# Patient Record
Sex: Female | Born: 1975 | Race: White | Hispanic: No | Marital: Married | Smoking: Never smoker
Health system: Southern US, Community
[De-identification: ages and names within clinical notes are randomized; demographics above are authoritative.]

## PROBLEM LIST (undated history)

## (undated) DIAGNOSIS — Z9889 Other specified postprocedural states: Secondary | ICD-10-CM

## (undated) DIAGNOSIS — F419 Anxiety disorder, unspecified: Secondary | ICD-10-CM

## (undated) DIAGNOSIS — T8859XA Other complications of anesthesia, initial encounter: Secondary | ICD-10-CM

## (undated) DIAGNOSIS — R519 Headache, unspecified: Secondary | ICD-10-CM

## (undated) DIAGNOSIS — R112 Nausea with vomiting, unspecified: Secondary | ICD-10-CM

## (undated) DIAGNOSIS — G4730 Sleep apnea, unspecified: Secondary | ICD-10-CM

## (undated) DIAGNOSIS — T4145XA Adverse effect of unspecified anesthetic, initial encounter: Secondary | ICD-10-CM

## (undated) DIAGNOSIS — K219 Gastro-esophageal reflux disease without esophagitis: Secondary | ICD-10-CM

## (undated) DIAGNOSIS — K631 Perforation of intestine (nontraumatic): Secondary | ICD-10-CM

## (undated) DIAGNOSIS — I10 Essential (primary) hypertension: Secondary | ICD-10-CM

## (undated) DIAGNOSIS — L4050 Arthropathic psoriasis, unspecified: Secondary | ICD-10-CM

## (undated) DIAGNOSIS — D649 Anemia, unspecified: Secondary | ICD-10-CM

## (undated) DIAGNOSIS — K259 Gastric ulcer, unspecified as acute or chronic, without hemorrhage or perforation: Secondary | ICD-10-CM

## (undated) DIAGNOSIS — Z87442 Personal history of urinary calculi: Secondary | ICD-10-CM

## (undated) HISTORY — PX: COLON SURGERY: SHX602

---
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
## 2003-07-12 ENCOUNTER — Ambulatory Visit (HOSPITAL_COMMUNITY): Admission: RE | Admit: 2003-07-12 | Discharge: 2003-07-12 | Payer: Self-pay | Admitting: Podiatry

## 2003-07-18 ENCOUNTER — Ambulatory Visit (HOSPITAL_COMMUNITY): Admission: RE | Admit: 2003-07-18 | Discharge: 2003-07-18 | Payer: Self-pay | Admitting: Podiatry

## 2009-04-17 ENCOUNTER — Emergency Department: Payer: Self-pay | Admitting: Unknown Physician Specialty

## 2009-08-15 ENCOUNTER — Emergency Department: Payer: Self-pay | Admitting: Emergency Medicine

## 2009-08-19 ENCOUNTER — Ambulatory Visit: Payer: Self-pay | Admitting: Gastroenterology

## 2009-08-23 ENCOUNTER — Ambulatory Visit: Payer: Self-pay | Admitting: Gastroenterology

## 2010-08-03 ENCOUNTER — Emergency Department: Payer: Self-pay | Admitting: Emergency Medicine

## 2010-08-18 ENCOUNTER — Ambulatory Visit: Payer: Self-pay | Admitting: Gastroenterology

## 2010-08-20 ENCOUNTER — Encounter: Payer: Self-pay | Admitting: Podiatry

## 2010-08-24 LAB — PATHOLOGY REPORT

## 2010-11-10 ENCOUNTER — Ambulatory Visit: Payer: Self-pay | Admitting: General Practice

## 2011-01-08 ENCOUNTER — Emergency Department: Payer: Self-pay | Admitting: Emergency Medicine

## 2011-01-09 ENCOUNTER — Ambulatory Visit: Payer: Self-pay | Admitting: Urology

## 2013-04-21 ENCOUNTER — Emergency Department: Payer: Self-pay | Admitting: Emergency Medicine

## 2013-04-21 LAB — COMPREHENSIVE METABOLIC PANEL
Albumin: 2.9 g/dL — ABNORMAL LOW (ref 3.4–5.0)
Alkaline Phosphatase: 161 U/L — ABNORMAL HIGH (ref 50–136)
Anion Gap: 11 (ref 7–16)
BUN: 8 mg/dL (ref 7–18)
Bilirubin,Total: 0.4 mg/dL (ref 0.2–1.0)
Calcium, Total: 9 mg/dL (ref 8.5–10.1)
Chloride: 106 mmol/L (ref 98–107)
Co2: 20 mmol/L — ABNORMAL LOW (ref 21–32)
Creatinine: 0.86 mg/dL (ref 0.60–1.30)
EGFR (African American): >60
EGFR (Non-African Amer.): >60
Glucose: 90 mg/dL (ref 65–99)
Osmolality: 272 (ref 275–301)
Potassium: 3.7 mmol/L (ref 3.5–5.1)
SGOT(AST): 16 U/L (ref 15–37)
SGPT (ALT): 22 U/L (ref 12–78)
Sodium: 137 mmol/L (ref 136–145)
Total Protein: 8.3 g/dL — ABNORMAL HIGH (ref 6.4–8.2)

## 2013-04-21 LAB — CBC
HCT: 32 % — ABNORMAL LOW (ref 35.0–47.0)
HGB: 10.9 g/dL — ABNORMAL LOW (ref 12.0–16.0)
MCH: 27.9 pg (ref 26.0–34.0)
MCHC: 34.1 g/dL (ref 32.0–36.0)
MCV: 82 fL (ref 80–100)
Platelet: 517 10*3/uL — ABNORMAL HIGH (ref 150–440)
RBC: 3.91 10*6/uL (ref 3.80–5.20)
RDW: 14.2 % (ref 11.5–14.5)
WBC: 9.5 10*3/uL (ref 3.6–11.0)

## 2013-04-21 LAB — TROPONIN I: Troponin-I: <0.02 ng/mL

## 2013-05-02 ENCOUNTER — Ambulatory Visit: Payer: Self-pay | Admitting: Specialist

## 2013-05-05 ENCOUNTER — Ambulatory Visit: Payer: Self-pay | Admitting: Specialist

## 2013-06-06 ENCOUNTER — Ambulatory Visit: Payer: Self-pay | Admitting: Specialist

## 2019-09-03 ENCOUNTER — Emergency Department: Payer: BC Managed Care – PPO

## 2019-09-03 ENCOUNTER — Encounter: Payer: Self-pay | Admitting: Emergency Medicine

## 2019-09-03 ENCOUNTER — Inpatient Hospital Stay
Admission: EM | Admit: 2019-09-03 | Discharge: 2019-09-12 | DRG: 391 | Disposition: A | Discharge status: Home / Self Care | Payer: BC Managed Care – PPO | Attending: Surgery | Admitting: Surgery

## 2019-09-03 ENCOUNTER — Other Ambulatory Visit: Payer: Self-pay

## 2019-09-03 DIAGNOSIS — Z91048 Other nonmedicinal substance allergy status: Secondary | ICD-10-CM | POA: Diagnosis not present

## 2019-09-03 DIAGNOSIS — Z20822 Contact with and (suspected) exposure to covid-19: Secondary | ICD-10-CM | POA: Diagnosis present

## 2019-09-03 DIAGNOSIS — Z6833 Body mass index (BMI) 33.0-33.9, adult: Secondary | ICD-10-CM | POA: Diagnosis not present

## 2019-09-03 DIAGNOSIS — L4050 Arthropathic psoriasis, unspecified: Secondary | ICD-10-CM | POA: Diagnosis present

## 2019-09-03 DIAGNOSIS — N390 Urinary tract infection, site not specified: Secondary | ICD-10-CM | POA: Diagnosis present

## 2019-09-03 DIAGNOSIS — Z888 Allergy status to other drugs, medicaments and biological substances status: Secondary | ICD-10-CM | POA: Diagnosis not present

## 2019-09-03 DIAGNOSIS — Z886 Allergy status to analgesic agent status: Secondary | ICD-10-CM

## 2019-09-03 DIAGNOSIS — B961 Klebsiella pneumoniae [K. pneumoniae] as the cause of diseases classified elsewhere: Secondary | ICD-10-CM | POA: Diagnosis present

## 2019-09-03 DIAGNOSIS — K5720 Diverticulitis of large intestine with perforation and abscess without bleeding: Secondary | ICD-10-CM | POA: Diagnosis present

## 2019-09-03 DIAGNOSIS — E669 Obesity, unspecified: Secondary | ICD-10-CM | POA: Diagnosis present

## 2019-09-03 DIAGNOSIS — Z8711 Personal history of peptic ulcer disease: Secondary | ICD-10-CM

## 2019-09-03 DIAGNOSIS — K651 Peritoneal abscess: Secondary | ICD-10-CM | POA: Diagnosis present

## 2019-09-03 DIAGNOSIS — K5780 Diverticulitis of intestine, part unspecified, with perforation and abscess without bleeding: Secondary | ICD-10-CM

## 2019-09-03 DIAGNOSIS — R1032 Left lower quadrant pain: Secondary | ICD-10-CM | POA: Diagnosis present

## 2019-09-03 HISTORY — DX: Gastric ulcer, unspecified as acute or chronic, without hemorrhage or perforation: K25.9

## 2019-09-03 HISTORY — DX: Arthropathic psoriasis, unspecified: L40.50

## 2019-09-03 LAB — COMPREHENSIVE METABOLIC PANEL
ALT: 21 U/L (ref 0–44)
AST: 21 U/L (ref 15–41)
Albumin: 3.9 g/dL (ref 3.5–5.0)
Alkaline Phosphatase: 86 U/L (ref 38–126)
Anion gap: 9 (ref 5–15)
BUN: 11 mg/dL (ref 6–20)
CO2: 24 mmol/L (ref 22–32)
Calcium: 8.7 mg/dL — ABNORMAL LOW (ref 8.9–10.3)
Chloride: 105 mmol/L (ref 98–111)
Creatinine, Ser: 1.15 mg/dL — ABNORMAL HIGH (ref 0.44–1.00)
GFR calc Af Amer: >60 mL/min (ref >60)
GFR calc non Af Amer: 57 mL/min — ABNORMAL LOW (ref >60)
Glucose, Bld: 123 mg/dL — ABNORMAL HIGH (ref 70–99)
Potassium: 3.7 mmol/L (ref 3.5–5.1)
Sodium: 138 mmol/L (ref 135–145)
Total Bilirubin: 0.6 mg/dL (ref 0.3–1.2)
Total Protein: 8.1 g/dL (ref 6.5–8.1)

## 2019-09-03 LAB — CBC
HCT: 46.8 % — ABNORMAL HIGH (ref 36.0–46.0)
Hemoglobin: 15.8 g/dL — ABNORMAL HIGH (ref 12.0–15.0)
MCH: 32 pg (ref 26.0–34.0)
MCHC: 33.8 g/dL (ref 30.0–36.0)
MCV: 94.7 fL (ref 80.0–100.0)
Platelets: 372 10*3/uL (ref 150–400)
RBC: 4.94 MIL/uL (ref 3.87–5.11)
RDW: 12.8 % (ref 11.5–15.5)
WBC: 9.2 10*3/uL (ref 4.0–10.5)
nRBC: 0 % (ref 0.0–0.2)

## 2019-09-03 LAB — RESPIRATORY PANEL BY RT PCR (FLU A&B, COVID)
Influenza A by PCR: NEGATIVE
Influenza B by PCR: NEGATIVE
SARS Coronavirus 2 by RT PCR: NEGATIVE

## 2019-09-03 LAB — LACTIC ACID, PLASMA: Lactic Acid, Venous: 1.5 mmol/L (ref 0.5–1.9)

## 2019-09-03 LAB — LIPASE, BLOOD: Lipase: 28 U/L (ref 11–51)

## 2019-09-03 MED ORDER — SODIUM CHLORIDE 0.9 % IV SOLN: Freq: Once | INTRAVENOUS | Status: AC

## 2019-09-03 MED ORDER — FENTANYL CITRATE (PF) 100 MCG/2ML IJ SOLN
50.0000 ug | INTRAMUSCULAR | Status: DC | PRN
  Administered 2019-09-03: 50 ug via INTRAVENOUS
  Filled 2019-09-03: qty 2

## 2019-09-03 MED ORDER — ONDANSETRON 4 MG PO TBDP: 4.0000 mg | ORAL_TABLET | Freq: Four times a day (QID) | ORAL | Status: DC | PRN

## 2019-09-03 MED ORDER — TOPIRAMATE 100 MG PO TABS
100.0000 mg | ORAL_TABLET | Freq: Two times a day (BID) | ORAL | Status: DC
  Administered 2019-09-04 – 2019-09-08 (×9): 100 mg via ORAL
  Filled 2019-09-03: qty 1
  Filled 2019-09-03: qty 4
  Filled 2019-09-03 (×3): qty 1
  Filled 2019-09-03: qty 4
  Filled 2019-09-03 (×5): qty 1

## 2019-09-03 MED ORDER — ONDANSETRON HCL 4 MG/2ML IJ SOLN
4.0000 mg | Freq: Four times a day (QID) | INTRAMUSCULAR | Status: DC | PRN
  Administered 2019-09-03 – 2019-09-12 (×9): 4 mg via INTRAVENOUS
  Filled 2019-09-03 (×9): qty 2

## 2019-09-03 MED ORDER — ONDANSETRON HCL 4 MG/2ML IJ SOLN
4.0000 mg | Freq: Once | INTRAMUSCULAR | Status: AC
  Administered 2019-09-03: 18:00:00 4 mg via INTRAVENOUS
  Filled 2019-09-03: qty 2

## 2019-09-03 MED ORDER — HYDROMORPHONE HCL 1 MG/ML IJ SOLN
0.5000 mg | Freq: Once | INTRAMUSCULAR | Status: AC
  Administered 2019-09-03: 0.5 mg via INTRAVENOUS
  Filled 2019-09-03: qty 1

## 2019-09-03 MED ORDER — PIPERACILLIN-TAZOBACTAM 3.375 G IVPB
3.3750 g | Freq: Three times a day (TID) | INTRAVENOUS | Status: DC
  Administered 2019-09-04 – 2019-09-12 (×26): 3.375 g via INTRAVENOUS
  Filled 2019-09-03 (×26): qty 50

## 2019-09-03 MED ORDER — HEPARIN SODIUM (PORCINE) 5000 UNIT/ML IJ SOLN
5000.0000 [IU] | Freq: Three times a day (TID) | INTRAMUSCULAR | Status: DC
  Administered 2019-09-04 – 2019-09-12 (×21): 5000 [IU] via SUBCUTANEOUS
  Filled 2019-09-03 (×22): qty 1

## 2019-09-03 MED ORDER — SODIUM CHLORIDE 0.9 % IV SOLN: INTRAVENOUS | Status: DC

## 2019-09-03 MED ORDER — IOHEXOL 300 MG/ML  SOLN
100.0000 mL | Freq: Once | INTRAMUSCULAR | Status: AC | PRN
  Administered 2019-09-03: 100 mL via INTRAVENOUS

## 2019-09-03 MED ORDER — PANTOPRAZOLE SODIUM 40 MG PO TBEC
40.0000 mg | DELAYED_RELEASE_TABLET | Freq: Every day | ORAL | Status: DC
  Administered 2019-09-04 – 2019-09-12 (×8): 40 mg via ORAL
  Filled 2019-09-03 (×8): qty 1

## 2019-09-03 MED ORDER — PIPERACILLIN-TAZOBACTAM 3.375 G IVPB 30 MIN
3.3750 g | Freq: Once | INTRAVENOUS | Status: AC
  Administered 2019-09-03: 3.375 g via INTRAVENOUS
  Filled 2019-09-03: qty 50

## 2019-09-03 MED ORDER — HYDROMORPHONE HCL 1 MG/ML IJ SOLN
1.0000 mg | Freq: Once | INTRAMUSCULAR | Status: AC
  Administered 2019-09-03: 19:00:00 1 mg via INTRAVENOUS
  Filled 2019-09-03: qty 1

## 2019-09-03 MED ORDER — HYDROMORPHONE HCL 1 MG/ML IJ SOLN
1.0000 mg | INTRAMUSCULAR | Status: DC | PRN
  Administered 2019-09-03 – 2019-09-04 (×4): 1 mg via INTRAVENOUS
  Filled 2019-09-03 (×4): qty 1

## 2019-09-03 MED ORDER — MORPHINE SULFATE (PF) 2 MG/ML IV SOLN
2.0000 mg | INTRAVENOUS | Status: DC | PRN
  Administered 2019-09-03 – 2019-09-04 (×2): 2 mg via INTRAVENOUS
  Filled 2019-09-03 (×2): qty 1

## 2019-09-03 NOTE — ED Provider Notes (Signed)
[facility] Emergency Department Provider Note       Time seen: ----------------------------------------- 6:45 PM on 09/03/2019 -----------------------------------------   I have reviewed the triage vital signs and the nursing notes.  HISTORY   Chief Complaint Abdominal Pain    HPI Sara Chapman is a 44 y.o. female with a history of psoriatic arthritis, stomach ulcer who presents to the ED for acute onset of lower abdominal pain around 2:00 today.  Patient was diagnosed with diverticulitis yesterday.  She has not had any vomiting, started antibiotics yesterday.  Pain is radiating from her lower abdomen to across her whole abdomen.  She is in 10 out of 10 pain.  Past Medical History:  Diagnosis Date  . Psoriatic arthritis ([place])   . Stomach ulcer     There are no problems to display for this patient.   History reviewed. No pertinent surgical history.  Allergies Patient has no known allergies.  Social History Social History   Tobacco Use  . Smoking status: Never Smoker  . Smokeless tobacco: Never Used  Substance Use Topics  . Alcohol use: Never  . Drug use: Never    Review of Systems Constitutional: Negative for fever. Cardiovascular: Negative for chest pain. Respiratory: Negative for shortness of breath. Gastrointestinal: Positive for abdominal pain Musculoskeletal: Negative for back pain. Skin: Negative for rash. Neurological: Negative for headaches, focal weakness or numbness.  All systems negative/normal/unremarkable except as stated in the HPI  ____________________________________________   PHYSICAL EXAM:  VITAL SIGNS: ED Triage Vitals  Enc Vitals Group     BP 09/03/19 1734 (!) 165/91     Pulse Rate 09/03/19 1734 (!) 124     Resp 09/03/19 1734 (!) 24     Temp 09/03/19 1734 98.2 F (36.8 C)     Temp Source 09/03/19 1734 Oral     SpO2 09/03/19 1734 98 %     Weight 09/03/19 1733 160 lb (72.6 kg)     Height 09/03/19  1733 4\' 10"  (1.473 m)     Head Circumference --      Peak Flow --      Pain Score 09/03/19 1732 10     Pain Loc --      Pain Edu? --      Excl. in [place]? --     Constitutional: Alert and oriented.  Moderate distress from pain Eyes: Conjunctivae are normal. Normal extraocular movements. ENT      Head: Normocephalic and atraumatic.      Nose: No congestion/rhinnorhea.      Mouth/Throat: Mucous membranes are moist.      Neck: No stridor. Cardiovascular: Normal rate, regular rhythm. No murmurs, rubs, or gallops. Respiratory: Normal respiratory effort without tachypnea nor retractions. Breath sounds are clear and equal bilaterally. No wheezes/rales/rhonchi. Gastrointestinal: Diffuse tenderness, some guarding is noted with hypoactive bowel sounds Musculoskeletal: Nontender with normal range of motion in extremities. No lower extremity tenderness nor edema. Neurologic:  Normal speech and language. No gross focal neurologic deficits are appreciated.  Skin:  Skin is warm, dry and intact. No rash noted. Psychiatric: Mood and affect are normal. Speech and behavior are normal.  ____________________________________________  ED COURSE:  As part of my medical decision making, I reviewed the following data within the [place] History obtained from family if available, nursing notes, old chart and ekg, as well as notes from prior ED visits. Patient presented for possible ruptured diverticulum, we will assess with labs and imaging as indicated at  this time.   Procedures  Sara Chapman was evaluated in Emergency Department on 09/03/2019 for the symptoms described in the history of present illness. She was evaluated in the context of the global COVID-19 pandemic, which necessitated consideration that the patient might be at risk for infection with the SARS-CoV-2 virus that causes COVID-19. Institutional protocols and algorithms that pertain to the evaluation of patients at risk for  COVID-19 are in a state of rapid change based on information released by regulatory bodies including the CDC and federal and state organizations. These policies and algorithms were followed during the patient's care in the ED.  ____________________________________________   LABS (pertinent positives/negatives)  Labs Reviewed  COMPREHENSIVE METABOLIC PANEL - Abnormal; Notable for the following components:      Result Value   Glucose, Bld 123 (*)    Creatinine, Ser 1.15 (*)    Calcium 8.7 (*)    GFR calc non Af Amer 57 (*)    All other components within normal limits  CBC - Abnormal; Notable for the following components:   Hemoglobin 15.8 (*)    HCT 46.8 (*)    All other components within normal limits  LIPASE, BLOOD  LACTIC ACID, PLASMA  URINALYSIS, COMPLETE (UACMP) WITH MICROSCOPIC  LACTIC ACID, PLASMA  POC URINE PREG, ED    RADIOLOGY Images were viewed by me  CT of the abdomen pelvis with contrast IMPRESSION: 1. Proximal sigmoid colon diverticulitis with perforation. There is an adjacent 4.2 by 2.0 cm collection of gas and fluid (somewhat multiloculated gas) along with some scattered small gas locules along the mesentery and other loops of bowel. 2. 3.9 by 2.9 cm right ovarian simple appearing cyst. No follow up is required based on the patient's age. 3. Trace free pelvic fluid.  Critical Value/emergent results were called by telephone at the time of interpretation on 09/03/2019 at 6:30 pm to provider Dr. Daryel November , who verbally acknowledged these results. ____________________________________________   DIFFERENTIAL DIAGNOSIS   Diverticulitis, perforation, abscess, obstruction  FINAL ASSESSMENT AND PLAN  Perforated diverticulitis   Plan: The patient had presented for severe lower abdominal pain with recent treatment for diverticulitis. Patient's labs were remarkably normal. Patient's imaging revealed proximal sigmoid colonic diverticulitis with perforation.   I will discuss with general surgery for evaluation and admission.  She has received IV Zosyn and Dilaudid for pain.   Ulice Dash, MD    Note: This note was generated in part or whole with voice recognition software. Voice recognition is usually quite accurate but there are transcription errors that can and very often do occur. I apologize for any typographical errors that were not detected and corrected.     Emily Filbert, MD 09/03/19 [phone]

## 2019-09-03 NOTE — ED Triage Notes (Signed)
Pt here for acute onset of lower abdominal pain at lunch time today.  Pt was dx with diverticulitis yesterday.  No vomiting at this time.  Started abx yesterday.  Pain radiating from lower abdomen to all over abdomen.  Pt tearful and appears in significant pain.

## 2019-09-03 NOTE — ED Notes (Signed)
PT states she has had not relief from last pain medication she received.  States pain is getting worse.  Called placed to Dr. Claudine Mouton on call.  Awaiting call back at this time.

## 2019-09-04 ENCOUNTER — Other Ambulatory Visit: Payer: Self-pay

## 2019-09-04 ENCOUNTER — Encounter: Payer: Self-pay | Admitting: Surgery

## 2019-09-04 DIAGNOSIS — K5720 Diverticulitis of large intestine with perforation and abscess without bleeding: Principal | ICD-10-CM

## 2019-09-04 LAB — BASIC METABOLIC PANEL
Anion gap: 8 (ref 5–15)
BUN: 10 mg/dL (ref 6–20)
CO2: 24 mmol/L (ref 22–32)
Calcium: 7.6 mg/dL — ABNORMAL LOW (ref 8.9–10.3)
Chloride: 109 mmol/L (ref 98–111)
Creatinine, Ser: 1.01 mg/dL — ABNORMAL HIGH (ref 0.44–1.00)
GFR calc Af Amer: >60 mL/min (ref >60)
GFR calc non Af Amer: >60 mL/min (ref >60)
Glucose, Bld: 128 mg/dL — ABNORMAL HIGH (ref 70–99)
Potassium: 3.7 mmol/L (ref 3.5–5.1)
Sodium: 141 mmol/L (ref 135–145)

## 2019-09-04 LAB — CBC
HCT: 38.3 % (ref 36.0–46.0)
Hemoglobin: 13.1 g/dL (ref 12.0–15.0)
MCH: 32.3 pg (ref 26.0–34.0)
MCHC: 34.2 g/dL (ref 30.0–36.0)
MCV: 94.6 fL (ref 80.0–100.0)
Platelets: 276 10*3/uL (ref 150–400)
RBC: 4.05 MIL/uL (ref 3.87–5.11)
RDW: 12.9 % (ref 11.5–15.5)
WBC: 11.3 10*3/uL — ABNORMAL HIGH (ref 4.0–10.5)
nRBC: 0 % (ref 0.0–0.2)

## 2019-09-04 MED ORDER — HYDROMORPHONE HCL 1 MG/ML IJ SOLN
0.5000 mg | INTRAMUSCULAR | Status: DC | PRN
  Administered 2019-09-04: 0.5 mg via INTRAVENOUS
  Administered 2019-09-04 (×3): 1 mg via INTRAVENOUS
  Administered 2019-09-04: 0.5 mg via INTRAVENOUS
  Administered 2019-09-05 – 2019-09-12 (×29): 1 mg via INTRAVENOUS
  Filled 2019-09-04 (×36): qty 1

## 2019-09-04 MED ORDER — MORPHINE SULFATE (PF) 2 MG/ML IV SOLN
2.0000 mg | INTRAVENOUS | Status: DC | PRN
  Administered 2019-09-05: 2 mg via INTRAVENOUS
  Administered 2019-09-05: 4 mg via INTRAVENOUS
  Administered 2019-09-08 – 2019-09-09 (×7): 2 mg via INTRAVENOUS
  Administered 2019-09-10 (×2): 4 mg via INTRAVENOUS
  Filled 2019-09-04 (×2): qty 1
  Filled 2019-09-04: qty 2
  Filled 2019-09-04: qty 1
  Filled 2019-09-04: qty 2
  Filled 2019-09-04: qty 1
  Filled 2019-09-04 (×2): qty 2
  Filled 2019-09-04 (×2): qty 1

## 2019-09-04 MED ORDER — HYDROMORPHONE HCL 1 MG/ML IJ SOLN: 0.5000 mg | INTRAMUSCULAR | Status: DC | PRN

## 2019-09-04 MED ORDER — POTASSIUM CHLORIDE 2 MEQ/ML IV SOLN
INTRAVENOUS | Status: DC
  Filled 2019-09-04 (×17): qty 1000

## 2019-09-04 MED ORDER — ACETAMINOPHEN 325 MG PO TABS
650.0000 mg | ORAL_TABLET | ORAL | Status: DC | PRN
  Administered 2019-09-10 – 2019-09-12 (×3): 650 mg via ORAL
  Filled 2019-09-04 (×3): qty 2

## 2019-09-04 NOTE — H&P (Signed)
[facility] SURGICAL HISTORY & PHYSICAL (cpt [id number])  HISTORY OF PRESENT ILLNESS (HPI):  44 y.o. female presented to [facility] ED overnight for abdominal pain. Patient reports that she had the acute onset of left lower quadrant abdominal pain a few days ago. She presented to [facility] ED for this on 2/02. She was found to have sigmoid diverticulitis without perforation, leukocytosis to 12.5K, and a Klebsiella UTI. She was start on PO Ciprofloxacin and Flagyl and discharged home. She reports that she had done well following discharge and her abdominal pain had drastically improved. However, around 2:45 PM yesterday she reports the sudden onset of diffuse abdominal pain which had changed from previous days. This pain is sharp and she is unable to localize this. She has gotten some relief from pain medications in the ED and palpation worsens the pain. She reports of fever of 100.6 at home and endorses associated nausea and emesis with the pain. No cough, congestion, CP, SOB, or urinary changes. No history of constipation. She believes she was diagnosed with uncomplicated diverticulitis once in the past. Of note, she is on Cosentyx and Humira for psoriatic arthritis. Work up in the ED this last night and this morning showed a mild leukocytosis to 11K and likely perforated sigmoid diverticulitis with small amount of loculated pneumoperitoneum without organized abscess or fluid collection.    General surgery is consulted by emergency medicine physician Dr Cherene Julian, MD for evaluation and management of perforated sigmoid diverticulitis.    PAST MEDICAL HISTORY (PMH):  Past Medical History:  Diagnosis Date  . Psoriatic arthritis ([facility])   . Stomach ulcer     Reviewed. Otherwise negative.   PAST SURGICAL HISTORY (PSH):  History reviewed. No pertinent surgical history.  Reviewed. Otherwise negative.   MEDICATIONS:  Prior to Admission medications   Medication Sig Start Date End  Date Taking? Authorizing Provider  ciprofloxacin (CIPRO) 500 MG tablet Take 500 mg by mouth 2 (two) times daily. 09/02/19  Yes [provider]  metroNIDAZOLE (FLAGYL) 500 MG tablet Take 500 mg by mouth 2 (two) times daily. 09/02/19  Yes [provider]  omeprazole (PRILOSEC) 40 MG capsule Take 40 mg by mouth 2 (two) times daily. 05/07/19  Yes [provider]  COSENTYX SENSOREADY, 300 MG, 150 MG/ML SOAJ Inject 300 mg as directed once a week. 08/28/19   [provider]  HUMIRA PEN 40 MG/0.8ML PNKT Inject 1 Syringe into the skin every 14 (fourteen) days. 07/22/19   [provider]  topiramate (TOPAMAX) 100 MG tablet Take 100 mg by mouth 2 (two) times daily. 05/07/19   [provider]     ALLERGIES:  Allergies  Allergen Reactions  . Clonazepam Other (See Comments)    SUICIDAL THOUGHTS SUICIDAL THOUGHTS SUICIDAL THOUGHTS   . Ibuprofen Other (See Comments)    stomach ulcers stomach ulcers stomach ulcers stomach ulcers   . Nsaids Other (See Comments)    GI ulcers  . Pseudoephedrine Other (See Comments)    palpitations palpitations   . Tolmetin Other (See Comments)    GI ulcers GI ulcers   . Pseudoephedrine Hcl Other (See Comments) and Palpitations    Unable to tolerate Unable to tolerate      SOCIAL HISTORY:  Social History   Socioeconomic History  . Marital status: Married    Spouse name: Not on file  . Number of children: Not on file  . Years of education: Not on file  . Highest education level: Not on file  Occupational History  . Not on file  Tobacco Use  . Smoking status: Never Smoker  . Smokeless tobacco: Never Used  Substance and Sexual Activity  . Alcohol use: Never  . Drug use: Never  . Sexual activity: Not on file  Other Topics Concern  . Not on file  Social History Narrative  . Not on file   Social Determinants of Health   Financial Resource Strain:   . Difficulty of Paying Living Expenses: Not on file   Food Insecurity:   . Worried About Programme researcher, broadcasting/film/video in the Last Year: Not on file  . Ran Out of Food in the Last Year: Not on file  Transportation Needs:   . Lack of Transportation (Medical): Not on file  . Lack of Transportation (Non-Medical): Not on file  Physical Activity:   . Days of Exercise per Week: Not on file  . Minutes of Exercise per Session: Not on file  Stress:   . Feeling of Stress : Not on file  Social Connections:   . Frequency of Communication with Friends and Family: Not on file  . Frequency of Social Gatherings with Friends and Family: Not on file  . Attends Religious Services: Not on file  . Active Member of Clubs or Organizations: Not on file  . Attends Banker Meetings: Not on file  . Marital Status: Not on file  Intimate Partner Violence:   . Fear of Current or Ex-Partner: Not on file  . Emotionally Abused: Not on file  . Physically Abused: Not on file  . Sexually Abused: Not on file     FAMILY HISTORY:  History reviewed. No pertinent family history.  Otherwise negative.   REVIEW OF SYSTEMS:  Review of Systems  Constitutional: Positive for fever. Negative for chills.  HENT: Negative for congestion and sore throat.   Respiratory: Negative for cough and shortness of breath.   Cardiovascular: Negative for chest pain and palpitations.  Gastrointestinal: Positive for abdominal pain, nausea and vomiting. Negative for blood in stool, constipation and diarrhea.  All other systems reviewed and are negative.   VITAL SIGNS:  Temp:  [98.2 F (36.8 C)] 98.2 F (36.8 C) (02/03 1734) Pulse Rate:  [88-124] 112 (02/04 0222) Resp:  [18-24] 18 (02/04 0222) BP: (113-165)/(73-91) 120/90 (02/04 0222) SpO2:  [95 %-99 %] 99 % (02/04 0222) Weight:  [72.6 kg] 72.6 kg (02/03 1733)     Height: 4\' 10"  (147.3 cm) Weight: 72.6 kg BMI (Calculated): 33.45   PHYSICAL EXAM:  Physical Exam Vitals and nursing note reviewed.  Constitutional:      General: She  is not in acute distress.    Appearance: She is well-developed. She is obese. She is not ill-appearing.     Comments: Patient laying in right lateral decubitus position however rolls around stretcher throughout examination  HENT:     Head: Normocephalic and atraumatic.  Eyes:     General: No scleral icterus.    Extraocular Movements: Extraocular movements intact.  Cardiovascular:     Rate and Rhythm: Regular rhythm. Tachycardia present.     Heart sounds: Normal heart sounds. No murmur.  Pulmonary:     Effort: Pulmonary effort is normal. No respiratory distress.     Breath sounds: Normal breath sounds.  Abdominal:     General: Abdomen is protuberant. There is no distension.     Palpations: Abdomen is soft.     Tenderness: There is generalized abdominal tenderness. There is no guarding or rebound.  Comments: Difficult to obtain appropriate abdominal examination, patient complains of diffuse pain to even light palpation, abdomen does appear soft, no guarding. She is able to change positions in stretcher and ambulate which decrease suspicion for peritonitis   Genitourinary:    Comments: Deferred Skin:    General: Skin is warm and dry.     Coloration: Skin is not jaundiced or pale.  Neurological:     General: No focal deficit present.     Mental Status: She is alert and oriented to person, place, and time.  Psychiatric:        Mood and Affect: Mood normal.        Behavior: Behavior normal.     INTAKE/OUTPUT:  This shift: No intake/output data recorded.  Last 2 shifts: @IOLAST2SHIFTS @  Labs:  CBC Latest Ref Rng & Units 09/04/2019 09/03/2019 04/21/2013  WBC 4.0 - 10.5 K/uL 11.3(H) 9.2 9.5  Hemoglobin 12.0 - 15.0 g/dL 13.1 15.8(H) 10.9(L)  Hematocrit 36.0 - 46.0 % 38.3 46.8(H) 32.0(L)  Platelets 150 - 400 K/uL 276 372 517(H)   CMP Latest Ref Rng & Units 09/04/2019 09/03/2019 04/21/2013  Glucose 70 - 99 mg/dL 128(H) 123(H) 90  BUN 6 - 20 mg/dL 10 11 8   Creatinine 0.44 - 1.00 mg/dL  1.01(H) 1.15(H) 0.86  Sodium 135 - 145 mmol/L 141 138 137  Potassium 3.5 - 5.1 mmol/L 3.7 3.7 3.7  Chloride 98 - 111 mmol/L 109 105 106  CO2 22 - 32 mmol/L 24 24 20(L)  Calcium 8.9 - 10.3 mg/dL 7.6(L) 8.7(L) 9.0  Total Protein 6.5 - 8.1 g/dL - 8.1 8.3(H)  Total Bilirubin 0.3 - 1.2 mg/dL - 0.6 0.4  Alkaline Phos 38 - 126 U/L - 86 161(H)  AST 15 - 41 U/L - 21 16  ALT 0 - 44 U/L - 21 22     Imaging studies:   CT Abdomen/Pelvis (09/03/2019) personally reviewed which shows sigmoid diverticulitis with perforation, small loculated areas of pneumoperitoneum, no organized fluid collection or abscess. Radiologist report also reviewed below:  IMPRESSION: 1. Proximal sigmoid colon diverticulitis with perforation. There is an adjacent 4.2 by 2.0 cm collection of gas and fluid (somewhat multiloculated gas) along with some scattered small gas locules along the mesentery and other loops of bowel. 2. 3.9 by 2.9 cm right ovarian simple appearing cyst. No follow up is required based on the patient's age. 3. Trace free pelvic fluid.   Assessment/Plan: (ICD-10's: K14.20) 44 y.o. female with mild leukocytosis and abdominal pain attributable to perforated sigmoid diverticulitis without obvious abscess, complicated by multiple pertinent comorbidities including immunosuppression for psoriatic arthritis.    - I do not think she is overtly peritonitic currently and I do think it would be reasonable to attempt conservative management, as outlined below, at this point. However, she understands that if this were to fail or she clinically deteriorates then we would proceed urgently to the operating room and this would result in a temporizing colostomy.     - Admit to general surgery  - NPO + IVF   - IV Abx (Zosyn)  - Pain control prn; antiemetics prn  - serial abdominal examinations  - Monitor leukocytosis  - may need to consider repeat imaging in 48-72 hours pending clinical course to ensure improvement and  that she does not develop an abscess  - Medical management of comorbidities   - DVT prophylaxis  All of the above findings and recommendations were discussed with the patient, and all of her questions were answered  to her expressed satisfaction.  -- Lynden Oxford, PA-C [facility] 09/04/2019, 7:15 AM [phone] M-F: 7am - 4pm

## 2019-09-04 NOTE — ED Notes (Signed)
Patient sat up on stretcher and had a large bile-colored emesis. Patient ambulated with steady gait to and from hallway commode. Ice chips provided.

## 2019-09-04 NOTE — ED Notes (Signed)
Pt placed on cardiac monitor, ST on the monitor. Pt also placed on 2L via [place], noted to desat due to being asleep after administration of Dilaudid. Pt noted to desat to 84% on RA, awakens easily and O2 increases to 94% on RA while awake. Pt placed on 2L due to being sleepy. Pt provided with ice chips due to c/o feeling nauseous and stating ice chips help with nausea, explained patient can have nausea medication in approx 1 hr. Pt states to this RN that she has asked several times for RN to come to bedside, despite when this RN rounding on patient her being asleep. Will continue to monitor for further patient needs.

## 2019-09-04 NOTE — ED Notes (Signed)
Pt to nurses station to say she had to use bathroom, on the way to the bathroom pt had 1 episode of vomiting.

## 2019-09-04 NOTE — ED Notes (Signed)
Patient transferred to hospital bed for comfort. Patient has cell phone and ice chips at bedside.

## 2019-09-04 NOTE — ED Notes (Addendum)
Patient lying on left side on hospital bed with hand over eyes. Patient asked for more pain meds and was informed that it wasn't quite time for more.  VSS. No c/o vomiting or nausea at this time.

## 2019-09-04 NOTE — ED Notes (Signed)
States pain improved after meds.

## 2019-09-04 NOTE — ED Notes (Signed)
Pt with nausea and pain.  meds given.

## 2019-09-04 NOTE — ED Notes (Signed)
Pt sleeping.  No nausea.  Pt in hallway bed  Iv fluids infusing.

## 2019-09-04 NOTE — ED Notes (Signed)
Pt resting in bed on R side, pt states pain continues to be 9/10 at this time, states pain better when she's not moving. Pt continues to rest in bed eating ice chips at this time. Nausea medication administered per order with patient request. Pt denies further needs at this time.

## 2019-09-04 NOTE — ED Notes (Signed)
Surgical PA at bedside to assess patient.

## 2019-09-04 NOTE — ED Notes (Signed)
meds infusing.  Pt  Alert.  Pt in hallway bed.

## 2019-09-04 NOTE — ED Notes (Signed)
This RN notified by Diplomatic Services operational officer that patient's sister called and stated that patient requested multiple times for pain medication. Pt currently speaking with Admitting MD, states to admitting MD that she has requested multiple times for pain medication. This RN explained to admitting MD and surgical PA that patient asleep since this RN arrival to start shift and patient asleep unless staff interacting and after staff finished interacting.

## 2019-09-04 NOTE — ED Notes (Signed)
Report given to Tom RN.

## 2019-09-04 NOTE — ED Notes (Signed)
This RN called and updated patient's husband Gala Romney at patient's request. Pt's husband updated regarding patient's condition. Denies any questions. Pt also with personal cell phone at bedside to continue to update her family.

## 2019-09-04 NOTE — ED Notes (Signed)
Pt eating ice chips

## 2019-09-04 NOTE — ED Notes (Addendum)
Surgical students at bedside to assess patient.

## 2019-09-04 NOTE — ED Notes (Signed)
Pt sleeping in hallway bed.

## 2019-09-04 NOTE — ED Notes (Signed)
Admitting MD at bedside at this time.

## 2019-09-04 NOTE — ED Notes (Signed)
Pt had 1 episode of vomiting. Meds administered per order. Pt given fresh cup of ice chips at this time.

## 2019-09-05 LAB — BASIC METABOLIC PANEL
Anion gap: 8 (ref 5–15)
BUN: 10 mg/dL (ref 6–20)
CO2: 20 mmol/L — ABNORMAL LOW (ref 22–32)
Calcium: 7.8 mg/dL — ABNORMAL LOW (ref 8.9–10.3)
Chloride: 110 mmol/L (ref 98–111)
Creatinine, Ser: 1.19 mg/dL — ABNORMAL HIGH (ref 0.44–1.00)
GFR calc Af Amer: >60 mL/min (ref >60)
GFR calc non Af Amer: 55 mL/min — ABNORMAL LOW (ref >60)
Glucose, Bld: 92 mg/dL (ref 70–99)
Potassium: 4 mmol/L (ref 3.5–5.1)
Sodium: 138 mmol/L (ref 135–145)

## 2019-09-05 LAB — CBC
HCT: 35.3 % — ABNORMAL LOW (ref 36.0–46.0)
Hemoglobin: 11.8 g/dL — ABNORMAL LOW (ref 12.0–15.0)
MCH: 32 pg (ref 26.0–34.0)
MCHC: 33.4 g/dL (ref 30.0–36.0)
MCV: 95.7 fL (ref 80.0–100.0)
Platelets: 268 10*3/uL (ref 150–400)
RBC: 3.69 MIL/uL — ABNORMAL LOW (ref 3.87–5.11)
RDW: 13 % (ref 11.5–15.5)
WBC: 10.6 10*3/uL — ABNORMAL HIGH (ref 4.0–10.5)
nRBC: 0 % (ref 0.0–0.2)

## 2019-09-05 NOTE — Progress Notes (Signed)
[facility] SURGICAL PROGRESS NOTE (cpt [phone])  Hospital Day(s): 2.   Interval History: Patient seen and examined, no acute events or new complaints overnight. Patient resting in bed. She reports that she continues to have diffuse abdominal pain. Only "minimally" improved compared to yesterday. No fever or chills. She has had one episode of emesis in the last 24 hours. Leukocytosis slightly improved to 10.6. Renal function stable elevated; sCr 1.19. No new complaints.   Review of Systems:  Constitutional: denies fever, chills  HEENT: denies cough or congestion  Respiratory: denies any shortness of breath  Cardiovascular: denies chest pain or palpitations  Gastrointestinal: + abdominal pain, + N/V, denied diarrhea/and bowel function as per interval history Genitourinary: denies burning with urination or urinary frequency   Vital signs in last 24 hours: [min-max] current  Temp:  [98.3 F (36.8 C)] 98.3 F (36.8 C) (02/05 0339) Pulse Rate:  [99-114] 101 (02/05 0405) Resp:  [12-22] 20 (02/05 0339) BP: (110-141)/(69-94) 141/72 (02/05 0339) SpO2:  [95 %-100 %] 100 % (02/05 0339)     Height: 4\' 10"  (147.3 cm) Weight: 72.6 kg BMI (Calculated): 33.45   Intake/Output last 2 shifts:  02/04 0701 - 02/05 0700 In: 1334.7 [I.V.:1193.5; IV Piggyback:141.2] Out: 0    Physical Exam:  Constitutional: alert, cooperative and no distress  HENT: normocephalic without obvious abnormality  Eyes: PERRL, EOM's grossly intact and symmetric  Respiratory: breathing non-labored at rest  Cardiovascular: mild tachycardia and sinus rhythm  Gastrointestinal: Obese, soft, tenderness primarily in the suprapubic and umbilical region (this is improved compared to yesterday), non-distended, No rebound, she again is able to transition in the bed, no overt peritonitis  Musculoskeletal: no edema or wounds, motor and sensation grossly intact, NT    Labs:  CBC Latest Ref Rng & Units 09/05/2019 09/04/2019  09/03/2019  WBC 4.0 - 10.5 K/uL 10.6(H) 11.3(H) 9.2  Hemoglobin 12.0 - 15.0 g/dL 11.8(L) 13.1 15.8(H)  Hematocrit 36.0 - 46.0 % 35.3(L) 38.3 46.8(H)  Platelets 150 - 400 K/uL 268 276 372   CMP Latest Ref Rng & Units 09/05/2019 09/04/2019 09/03/2019  Glucose 70 - 99 mg/dL 92 11/01/2019) 604(V)  BUN 6 - 20 mg/dL 10 10 11   Creatinine 0.44 - 1.00 mg/dL 409(W) ) 1.19(J)  Sodium 135 - 145 mmol/L 138 141 138  Potassium 3.5 - 5.1 mmol/L 4.0 3.7 3.7  Chloride 98 - 111 mmol/L 110 109 105  CO2 22 - 32 mmol/L 20(L) 24 24  Calcium 8.9 - 10.3 mg/dL 7.8(L) 7.6(L) 8.7(L)  Total Protein 6.5 - 8.1 g/dL - - 8.1  Total Bilirubin 0.3 - 1.2 mg/dL - - 0.6  Alkaline Phos 38 - 126 U/L - - 86  AST 15 - 41 U/L - - 21  ALT 0 - 44 U/L - - 21    Imaging studies: No new pertinent imaging studies   Assessment/Plan: (ICD-10's: K41.20) 44 y.o. female with improved leukocytosis and persistent, but mildly improved, abdominal pain attributable to perforated sigmoid diverticulitis without obvious abscess, complicated by multiple pertinent comorbidities including immunosuppression for psoriatic arthritis   - Again, I do not think she is overtly peritonitic and objective pain on examination appears improved to me today. I think it is reasonable to continue with conservative management as described below with the understanding that is she were to clinically deteriorate or fail management she would requir surgical intervention which would likely result in colostomy. She understands and is in agreement.    - Continue NPO  - Continue  IVF resuscitation; monitor renal function and U/O   - Continue IV Abx (Zosyn --> Day 2)   - serial abdominal examinations             - Monitor leukocytosis   - may need to consider repeat imaging over the weekend pending clinical course to ensure improvement and that she does not develop an abscess             - Medical management of comorbidities              - DVT prophylaxis   All of the above  findings and recommendations were discussed with the patient, and the medical team, and all of patient's questions were answered to her expressed satisfaction.  -- Edison Simon, PA-C [facility] 09/05/2019, 7:19 AM [phone] M-F: 7am - 4pm

## 2019-09-05 NOTE — Plan of Care (Signed)
Continuing with plan of care. 

## 2019-09-06 LAB — BASIC METABOLIC PANEL
Anion gap: 7 (ref 5–15)
BUN: 12 mg/dL (ref 6–20)
CO2: 20 mmol/L — ABNORMAL LOW (ref 22–32)
Calcium: 8.1 mg/dL — ABNORMAL LOW (ref 8.9–10.3)
Chloride: 111 mmol/L (ref 98–111)
Creatinine, Ser: 1.16 mg/dL — ABNORMAL HIGH (ref 0.44–1.00)
GFR calc Af Amer: >60 mL/min (ref >60)
GFR calc non Af Amer: 57 mL/min — ABNORMAL LOW (ref >60)
Glucose, Bld: 77 mg/dL (ref 70–99)
Potassium: 4.2 mmol/L (ref 3.5–5.1)
Sodium: 138 mmol/L (ref 135–145)

## 2019-09-06 LAB — CBC
HCT: 34.4 % — ABNORMAL LOW (ref 36.0–46.0)
Hemoglobin: 11.3 g/dL — ABNORMAL LOW (ref 12.0–15.0)
MCH: 31.3 pg (ref 26.0–34.0)
MCHC: 32.8 g/dL (ref 30.0–36.0)
MCV: 95.3 fL (ref 80.0–100.0)
Platelets: 270 10*3/uL (ref 150–400)
RBC: 3.61 MIL/uL — ABNORMAL LOW (ref 3.87–5.11)
RDW: 12.6 % (ref 11.5–15.5)
WBC: 8.7 10*3/uL (ref 4.0–10.5)
nRBC: 0 % (ref 0.0–0.2)

## 2019-09-06 NOTE — Plan of Care (Signed)
Continuing with plan of care. 

## 2019-09-06 NOTE — Progress Notes (Signed)
SURGICAL PROGRESS NOTE   Hospital Day(s): 3.   Post op day(s):  Marland Kitchen   Interval History: Patient seen and examined, no acute events or new complaints overnight. Patient reports basically feeling the same pain. She does report that the pain is improved by the pain medication. He denies any nausea or vomiting today. She endorses having 1 episode of loose stools. Pain on the periumbilical area. Pain does not radiate to other part of the body. There is no aggravating factor. Alleviating factor is to current pain medications.  Vital signs in last 24 hours: [min-max] current  Temp:  [97.8 F (36.6 C)-99.1 F (37.3 C)] 97.8 F (36.6 C) (02/06 0435) Pulse Rate:  [92-98] 92 (02/06 0435) Resp:  [18-20] 20 (02/06 0435) BP: (111-132)/(73-89) 113/81 (02/06 0435) SpO2:  [97 %-100 %] 97 % (02/06 0435)     Height: 4\' 10"  (147.3 cm) Weight: 72.6 kg BMI (Calculated): 33.45   Physical Exam:  Constitutional: alert, cooperative and no distress  Respiratory: breathing non-labored at rest  Cardiovascular: regular rate and sinus rhythm  Gastrointestinal: soft, moderate-tender on periumbilical area, and non-distended  Labs:  CBC Latest Ref Rng & Units 09/06/2019 09/05/2019 09/04/2019  WBC 4.0 - 10.5 K/uL 8.7 10.6(H) 11.3(H)  Hemoglobin 12.0 - 15.0 g/dL 11.3(L) 11.8(L) 13.1  Hematocrit 36.0 - 46.0 % 34.4(L) 35.3(L) 38.3  Platelets 150 - 400 K/uL 270 268 276   CMP Latest Ref Rng & Units 09/06/2019 09/05/2019 09/04/2019  Glucose 70 - 99 mg/dL 77 92 11/02/2019)  BUN 6 - 20 mg/dL 12 10 10   Creatinine 0.44 - 1.00 mg/dL 366(Y) ) 4.03(K)  Sodium 135 - 145 mmol/L 138 138 141  Potassium 3.5 - 5.1 mmol/L 4.2 4.0 3.7  Chloride 98 - 111 mmol/L 111 110 109  CO2 22 - 32 mmol/L 20(L) 20(L) 24  Calcium 8.9 - 10.3 mg/dL 8.1(L) 7.8(L) 7.6(L)  Total Protein 6.5 - 8.1 g/dL - - -  Total Bilirubin 0.3 - 1.2 mg/dL - - -  Alkaline Phos 38 - 126 U/L - - -  AST 15 - 41 U/L - - -  ALT 0 - 44 U/L - - -    Imaging studies: No new  pertinent imaging studies   Assessment/Plan:  44 y.o. female with complicated diverticulitis with abscess, complicated by pertinent comorbidities including psoriatic arthritis on immunosuppresors.  Patient continued complaining of significant pain. There has been no significant change on her physical exam or exam the pain is now more localized to the umbilical area. Patient continued to be laying on fetal position. She is still walking around the room to go to the bathroom without any problems. The white blood cell count today normalized to 8.7. Creatinine and electrolytes continue to be stable. I discussed with patient that we will continue with current management with IV antibiotic and pain management. The goal is to control this diverticulitis and keep it localized to avoid Hartman's procedure. Ideally we will wait until day number five to repeat CT scan to see if she has formed abscess to be able to be percutaneously drained. Patient understood plan and agreed. We will keep n.p.o. We will keep IV antibiotic therapy. We will continue with DVT prophylaxis. Patient encouraged to ambulate when the pain is controlled.   9.56(L, MD

## 2019-09-07 NOTE — Progress Notes (Signed)
[facility] Hospital Day(s): 4.   Post op day(s):  Marland Kitchen   Interval History: Patient seen and examined, no acute events or new complaints overnight. Patient reports feeling the same.  There has been literally no change on her physical exam or symptoms.  Patient denies any nausea or vomiting.  Patient reported passing loose stool.  The pain today second on the right abdomen.  There is upper radiation.  There is no alleviating or aggravating factor.  Patient basically sleeping most of the time.  While she is asleep she does not complain.  The pain does not wake her up.  Patient is able to ambulate in the room without any problem.  When patient awakes she reported the pain is 8 out of 10.  Vital signs in last 24 hours: [min-max] current  Temp:  [97.5 F (36.4 C)-98.8 F (37.1 C)] 97.5 F (36.4 C) (02/07 0500) Pulse Rate:  [77-85] 77 (02/07 0500) Resp:  [14-18] 16 (02/07 0500) BP: (124-135)/(75-87) 129/79 (02/07 0500) SpO2:  [96 %-100 %] 99 % (02/07 0500)     Height: 4\' 10"  (147.3 cm) Weight: 72.6 kg BMI (Calculated): 33.45   Physical Exam:  Constitutional: alert, cooperative and no distress  Respiratory: breathing non-labored at rest  Cardiovascular: regular rate and sinus rhythm  Gastrointestinal: soft, moderate-tender, and non-distended  Labs:  CBC Latest Ref Rng & Units 09/06/2019 09/05/2019 09/04/2019  WBC 4.0 - 10.5 K/uL 8.7 10.6(H) 11.3(H)  Hemoglobin 12.0 - 15.0 g/dL 11.3(L) 11.8(L) 13.1  Hematocrit 36.0 - 46.0 % 34.4(L) 35.3(L) 38.3  Platelets 150 - 400 K/uL 270 268 276   CMP Latest Ref Rng & Units 09/06/2019 09/05/2019 09/04/2019  Glucose 70 - 99 mg/dL 77 92 128(H)  BUN 6 - 20 mg/dL 12 10 10   Creatinine 0.44 - 1.00 mg/dL 1.16(H) 1.19(H) 1.01(H)  Sodium 135 - 145 mmol/L 138 138 141  Potassium 3.5 - 5.1 mmol/L 4.2 4.0 3.7  Chloride 98 - 111 mmol/L 111 110 109  CO2 22 - 32 mmol/L 20(L) 20(L) 24  Calcium 8.9 - 10.3 mg/dL 8.1(L) 7.8(L) 7.6(L)  Total Protein 6.5 - 8.1 g/dL -  - -  Total Bilirubin 0.3 - 1.2 mg/dL - - -  Alkaline Phos 38 - 126 U/L - - -  AST 15 - 41 U/L - - -  ALT 0 - 44 U/L - - -    Imaging studies: No new pertinent imaging studies   Assessment/Plan:  44 y.o. female with complicated diverticulitis with abscess, complicated by pertinent comorbidities including psoriatic arthritis on immunosuppresors.  Patient has no significant change clinically.  Pain continues to be reported as 8 out of 10.  As per discussion with nurse the patient is sleeping most of the time.  Every time that I go to the wound patient is laying down on her right side sleeping.  Patient is easily arousable.  When woken up she does report that the pain is 8/10 but otherwise she is sleeping.  Nurses also report that she has been walking to the bathroom without any assistance or any problems.  There has been no nausea or vomiting.  The white blood cell count yesterday was 8.7.  There has been no fever or chills.  There has been no tachycardia.  We will consider repeating CT scan tomorrow which will date day #5 of antibiotic therapy.  We will continue with current antibiotic therapy with.  We will continue with DVT prophylaxis.  I encouraged the patient to  ambulate.  Gae Gallop, MD

## 2019-09-07 NOTE — Plan of Care (Signed)
Continuing with plan of care. 

## 2019-09-08 ENCOUNTER — Encounter: Payer: Self-pay | Admitting: Surgery

## 2019-09-08 ENCOUNTER — Inpatient Hospital Stay: Payer: BC Managed Care – PPO

## 2019-09-08 LAB — PREGNANCY, URINE: Preg Test, Ur: NEGATIVE

## 2019-09-08 LAB — BASIC METABOLIC PANEL
Anion gap: 13 (ref 5–15)
BUN: 13 mg/dL (ref 6–20)
CO2: 16 mmol/L — ABNORMAL LOW (ref 22–32)
Calcium: 8.5 mg/dL — ABNORMAL LOW (ref 8.9–10.3)
Chloride: 105 mmol/L (ref 98–111)
Creatinine, Ser: 1.02 mg/dL — ABNORMAL HIGH (ref 0.44–1.00)
GFR calc Af Amer: >60 mL/min (ref >60)
GFR calc non Af Amer: >60 mL/min (ref >60)
Glucose, Bld: 59 mg/dL — ABNORMAL LOW (ref 70–99)
Potassium: 3.8 mmol/L (ref 3.5–5.1)
Sodium: 134 mmol/L — ABNORMAL LOW (ref 135–145)

## 2019-09-08 LAB — CBC
HCT: 36.7 % (ref 36.0–46.0)
Hemoglobin: 12.4 g/dL (ref 12.0–15.0)
MCH: 31.6 pg (ref 26.0–34.0)
MCHC: 33.8 g/dL (ref 30.0–36.0)
MCV: 93.6 fL (ref 80.0–100.0)
Platelets: 330 10*3/uL (ref 150–400)
RBC: 3.92 MIL/uL (ref 3.87–5.11)
RDW: 12.2 % (ref 11.5–15.5)
WBC: 6.7 10*3/uL (ref 4.0–10.5)
nRBC: 0 % (ref 0.0–0.2)

## 2019-09-08 MED ORDER — IOHEXOL 300 MG/ML  SOLN
100.0000 mL | Freq: Once | INTRAMUSCULAR | Status: AC | PRN
  Administered 2019-09-08: 100 mL via INTRAVENOUS

## 2019-09-08 NOTE — Progress Notes (Signed)
Initial Nutrition Assessment  DOCUMENTATION CODES:   Obesity unspecified  INTERVENTION:   RD will monitor for diet advancement vs the need for nutrition support  Recommend TPN if unable to advance diet in the next 1-2 days  NUTRITION DIAGNOSIS:   Inadequate oral intake related to acute illness as evidenced by NPO status.  GOAL:   Patient will meet greater than or equal to 90% of their needs  MONITOR:   Diet advancement, Labs, Weight trends, Skin, I & O's  REASON FOR ASSESSMENT:   NPO/Clear Liquid Diet    ASSESSMENT:   44 y.o. female with perforated sigmoid diverticulitis without obvious abscess, complicated by multiple pertinent comorbidities including h/o PUD and immunosuppression for psoriatic arthritis  RD working remotely.  Pt with abdominal pain that started on her day of admit. Pt has remained NPO since admit and has now been without any nutrition for 6 days. Pt reports continued abdominal pain today. Pt also reports some nausea and emesis. Plan is for repeat CT scan today per MD note. Recommend TPN if unable to advance diet in the next 1-2 days. Pt is at high refeed risk.   Per chart, pt with weight gain pta.   Medications reviewed and include: heparin, protonix, LRS w/KCl @100ml /hr, zosyn  Labs reviewed: Na 134(L), creat 1.02(H)  Unable to complete Nutrition-Focused physical exam at this time.   Diet Order:   Diet Order            Diet NPO time specified Except for: Ice Chips, Sips with Meds  Diet effective now             EDUCATION NEEDS:   No education needs have been identified at this time  Skin:  Skin Assessment: Reviewed RN Assessment  Last BM:  2/7  Height:   Ht Readings from Last 1 Encounters:  09/03/19 4\' 10"  (1.473 m)    Weight:   Wt Readings from Last 1 Encounters:  09/03/19 72.6 kg    Ideal Body Weight:  44.5 kg  BMI:  Body mass index is 33.44 kg/m.  Estimated Nutritional Needs:   Kcal:  1600-1800kcal/day  Protein:   80-90g/day  Fluid:  >1.4L/day  MS, RD, LDN Contact information available in Amion

## 2019-09-08 NOTE — Progress Notes (Signed)
[facility] SURGICAL PROGRESS NOTE (cpt [phone])  Hospital Day(s): 5.   Interval History: Patient seen and examined, no acute events or new complaints overnight. Patient reports that she continues to have "pretty much the same" abdominal pain as she did on presentation. She does currently have some relief in pain secondary to recently receiving pain medications although she reports that the pain returns to an 8 out 10 when the medication wears off. No fever or chills but she did have an episode of nausea and emesis overnight. She did also have loose stool over the weekend. Leukocytosis from admission resolved. No other new complaints.   Review of Systems:  Constitutional: denies fever, chills  HEENT: denies cough or congestion  Respiratory: denies any shortness of breath  Cardiovascular: denies chest pain or palpitations  Gastrointestinal: + abdominal pain, + N/V, denied diarrhea/and bowel function as per interval history Genitourinary: denies burning with urination or urinary frequency   Vital signs in last 24 hours: [min-max] current  Temp:  [97.9 F (36.6 C)-98.6 F (37 C)] 97.9 F (36.6 C) (02/08 0556) Pulse Rate:  [73-81] 73 (02/08 0556) Resp:  [16-20] 16 (02/08 0556) BP: (112-144)/(68-91) 112/68 (02/08 0556) SpO2:  [92 %-99 %] 96 % (02/08 0556)     Height: 4\' 10"  (147.3 cm) Weight: 72.6 kg BMI (Calculated): 33.45   Intake/Output last 2 shifts:  02/07 0701 - 02/08 0700 In: 2629.2 [I.V.:2479; IV Piggyback:150.3] Out: 200 [Urine:200]   Physical Exam:  Constitutional: alert, cooperative and no distress, patient appears more comfortable this morning  HENT: normocephalic without obvious abnormality  Eyes: PERRL, EOM's grossly intact and symmetric  Respiratory: breathing non-labored at rest  Cardiovascular: regular rate and sinus rhythm  Gastrointestinal: Obese, soft, tenderness primarily at umbilical region although this is objectively improved compared to my last  examination, non-distended, No rebound/guarding Musculoskeletal: no edema or wounds, motor and sensation grossly intact, NT    Labs:  CBC Latest Ref Rng & Units 09/06/2019 09/05/2019 09/04/2019  WBC 4.0 - 10.5 K/uL 8.7 10.6(H) 11.3(H)  Hemoglobin 12.0 - 15.0 g/dL 11.3(L) 11.8(L) 13.1  Hematocrit 36.0 - 46.0 % 34.4(L) 35.3(L) 38.3  Platelets 150 - 400 K/uL 270 268 276   CMP Latest Ref Rng & Units 09/06/2019 09/05/2019 09/04/2019  Glucose 70 - 99 mg/dL 77 92 11/02/2019)  BUN 6 - 20 mg/dL 12 10 10   Creatinine 0.44 - 1.00 mg/dL 196(Q) ) 2.29(N)  Sodium 135 - 145 mmol/L 138 138 141  Potassium 3.5 - 5.1 mmol/L 4.2 4.0 3.7  Chloride 98 - 111 mmol/L 111 110 109  CO2 22 - 32 mmol/L 20(L) 20(L) 24  Calcium 8.9 - 10.3 mg/dL 8.1(L) 7.8(L) 7.6(L)  Total Protein 6.5 - 8.1 g/dL - - -  Total Bilirubin 0.3 - 1.2 mg/dL - - -  Alkaline Phos 38 - 126 U/L - - -  AST 15 - 41 U/L - - -  ALT 0 - 44 U/L - - -     Imaging studies: No new pertinent imaging studies   Assessment/Plan: (ICD-10's: K55.20) 44 y.o. female with perforated sigmoid diverticulitis without obvious abscess, complicated bymultiplepertinent comorbidities includingimmunosuppression for psoriatic arthritis   - Although I do feel she is clinically improving with the resolution of her leukocytosis and tachycardia, she continues to endorse subjective 8-9/10 pain and does have appreciable tenderness on examination. I think it is reasonable to repeat CT Abdomen/Pelvis with contrast to re-assess intra abdominal process for any worsening, abscess development, or to ensure improvement. She  is in agreement    - NPO  - IVF Resuscitation  - Continue IV Abx (Zosyn --> Day 5)    - Repeat BMP and CBC this morning    - No emergent surgical intervention however she understands pending the CT results or any clinical deterioration she may require intervention  - Medical management of comorbidities  - DVT prophylaxis    All of the above findings  and recommendations were discussed with the patient, and the medical team, and all of patient's questions were answered to her expressed satisfaction.  -- Edison Simon, PA-C [facility] 09/08/2019, 7:36 AM [phone] M-F: 7am - 4pm

## 2019-09-09 ENCOUNTER — Inpatient Hospital Stay: Payer: BC Managed Care – PPO

## 2019-09-09 DIAGNOSIS — K5780 Diverticulitis of intestine, part unspecified, with perforation and abscess without bleeding: Secondary | ICD-10-CM

## 2019-09-09 LAB — URINALYSIS, COMPLETE (UACMP) WITH MICROSCOPIC
Bacteria, UA: NONE SEEN
Bilirubin Urine: NEGATIVE
Glucose, UA: NEGATIVE mg/dL
Ketones, ur: 20 mg/dL — AB
Leukocytes,Ua: NEGATIVE
Nitrite: NEGATIVE
Protein, ur: NEGATIVE mg/dL
RBC / HPF: >50 RBC/hpf — ABNORMAL HIGH (ref 0–5)
Specific Gravity, Urine: 1.01 (ref 1.005–1.030)
pH: 6 (ref 5.0–8.0)

## 2019-09-09 LAB — CULTURE, BLOOD (ROUTINE X 2)
Culture: NO GROWTH
Culture: NO GROWTH
Special Requests: ADEQUATE

## 2019-09-09 LAB — CBC
HCT: 35.4 % — ABNORMAL LOW (ref 36.0–46.0)
Hemoglobin: 12 g/dL (ref 12.0–15.0)
MCH: 31.4 pg (ref 26.0–34.0)
MCHC: 33.9 g/dL (ref 30.0–36.0)
MCV: 92.7 fL (ref 80.0–100.0)
Platelets: 341 10*3/uL (ref 150–400)
RBC: 3.82 MIL/uL — ABNORMAL LOW (ref 3.87–5.11)
RDW: 12.3 % (ref 11.5–15.5)
WBC: 6.3 10*3/uL (ref 4.0–10.5)
nRBC: 0 % (ref 0.0–0.2)

## 2019-09-09 LAB — BASIC METABOLIC PANEL
Anion gap: 11 (ref 5–15)
BUN: 9 mg/dL (ref 6–20)
CO2: 19 mmol/L — ABNORMAL LOW (ref 22–32)
Calcium: 8.5 mg/dL — ABNORMAL LOW (ref 8.9–10.3)
Chloride: 110 mmol/L (ref 98–111)
Creatinine, Ser: 1.25 mg/dL — ABNORMAL HIGH (ref 0.44–1.00)
GFR calc Af Amer: >60 mL/min (ref >60)
GFR calc non Af Amer: 52 mL/min — ABNORMAL LOW (ref >60)
Glucose, Bld: 98 mg/dL (ref 70–99)
Potassium: 4.1 mmol/L (ref 3.5–5.1)
Sodium: 140 mmol/L (ref 135–145)

## 2019-09-09 LAB — APTT: aPTT: 37 seconds — ABNORMAL HIGH (ref 24–36)

## 2019-09-09 LAB — PROTIME-INR
INR: 1 (ref 0.8–1.2)
Prothrombin Time: 13.2 seconds (ref 11.4–15.2)

## 2019-09-09 MED ORDER — MORPHINE SULFATE (PF) 2 MG/ML IV SOLN
INTRAVENOUS | Status: AC
  Administered 2019-09-09: 2 mg via INTRAVENOUS
  Filled 2019-09-09: qty 1

## 2019-09-09 MED ORDER — FENTANYL CITRATE (PF) 100 MCG/2ML IJ SOLN
INTRAMUSCULAR | Status: AC
  Filled 2019-09-09: qty 2

## 2019-09-09 MED ORDER — SODIUM CHLORIDE 0.9 % IV SOLN: INTRAVENOUS | Status: DC

## 2019-09-09 MED ORDER — BOOST / RESOURCE BREEZE PO LIQD CUSTOM: 1.0000 | Freq: Three times a day (TID) | ORAL | Status: DC

## 2019-09-09 MED ORDER — FENTANYL CITRATE (PF) 100 MCG/2ML IJ SOLN
INTRAMUSCULAR | Status: AC | PRN
  Administered 2019-09-09 (×2): 50 ug via INTRAVENOUS

## 2019-09-09 MED ORDER — MORPHINE SULFATE (PF) 2 MG/ML IV SOLN
INTRAVENOUS | Status: AC
  Filled 2019-09-09: qty 1

## 2019-09-09 MED ORDER — MIDAZOLAM HCL 5 MG/5ML IJ SOLN
INTRAMUSCULAR | Status: AC
  Filled 2019-09-09: qty 5

## 2019-09-09 MED ORDER — SODIUM CHLORIDE 0.9% FLUSH
5.0000 mL | Freq: Three times a day (TID) | INTRAVENOUS | Status: DC
  Administered 2019-09-09 – 2019-09-12 (×9): 5 mL

## 2019-09-09 MED ORDER — MIDAZOLAM HCL 5 MG/5ML IJ SOLN
INTRAMUSCULAR | Status: AC | PRN
  Administered 2019-09-09 (×2): 1 mg via INTRAVENOUS

## 2019-09-09 MED ORDER — KCL IN DEXTROSE-NACL 20-5-0.45 MEQ/L-%-% IV SOLN
INTRAVENOUS | Status: DC
  Filled 2019-09-09 (×7): qty 1000

## 2019-09-09 NOTE — Progress Notes (Addendum)
[facility] SURGICAL PROGRESS NOTE (cpt [phone])  Hospital Day(s): 6.   Interval History: Patient seen and examined, no acute events or new complaints overnight. Patient reports she continues to have relatively unchanged abdominal pain, primarily near umbilicus as in previous days. Some intermittent nausea but no emesis in the last 24 hours. WBC remains in normal ranges at (6.3) and she remains afebrile. Plan today for percutaneous drainage with IR today.     Review of Systems:  Constitutional: denies fever, chills  HEENT: denies cough or congestion  Respiratory: denies any shortness of breath  Cardiovascular: denies chest pain or palpitations  Gastrointestinal: + abdominal pain, + Nausea, denied vomiting, denied diarrhea/and bowel function as per interval history Genitourinary: denies burning with urination or urinary frequency   Vital signs in last 24 hours: [min-max] current  Temp:  [98.2 F (36.8 C)-98.4 F (36.9 C)] 98.4 F (36.9 C) (02/09 0413) Pulse Rate:  [70-80] 70 (02/09 0413) Resp:  [16-18] 16 (02/09 0413) BP: (131-142)/(92-96) 139/96 (02/09 0413) SpO2:  [97 %-98 %] 98 % (02/09 0413)     Height: 4\' 10"  (147.3 cm) Weight: 72.6 kg BMI (Calculated): 33.45   Intake/Output last 2 shifts:  02/08 0701 - 02/09 0700 In: 2583.8 [P.O.:1560; I.V.:973.8; IV Piggyback:50] Out: 800 [Urine:800]   Physical Exam:  Constitutional: alert, cooperative and no distress, patient appears more comfortable this morning  HENT: normocephalic without obvious abnormality  Eyes: PERRL, EOM's grossly intact and symmetric  Respiratory: breathing non-labored at rest  Cardiovascular: regular rate and sinus rhythm  Gastrointestinal: Obese, soft, tenderness primarily at umbilical region, non-distended, No rebound/guarding Musculoskeletal: no edema or wounds, motor and sensation grossly intact, NT    Labs:  CBC Latest Ref Rng & Units 09/09/2019 09/08/2019 09/06/2019  WBC 4.0 - 10.5 K/uL 6.3  6.7 8.7  Hemoglobin 12.0 - 15.0 g/dL 12.0 12.4 11.3(L)  Hematocrit 36.0 - 46.0 % 35.4(L) 36.7 34.4(L)  Platelets 150 - 400 K/uL 341 330 270   CMP Latest Ref Rng & Units 09/09/2019 09/08/2019 09/06/2019  Glucose 70 - 99 mg/dL 98 59(L) 77  BUN 6 - 20 mg/dL 9 13 12   Creatinine 0.44 - 1.00 mg/dL 1.25(H) 1.02(H) 1.16(H)  Sodium 135 - 145 mmol/L 140 134(L) 138  Potassium 3.5 - 5.1 mmol/L 4.1 3.8 4.2  Chloride 98 - 111 mmol/L 110 105 111  CO2 22 - 32 mmol/L 19(L) 16(L) 20(L)  Calcium 8.9 - 10.3 mg/dL 8.5(L) 8.5(L) 8.1(L)  Total Protein 6.5 - 8.1 g/dL - - -  Total Bilirubin 0.3 - 1.2 mg/dL - - -  Alkaline Phos 38 - 126 U/L - - -  AST 15 - 41 U/L - - -  ALT 0 - 44 U/L - - -     Imaging studies: No new pertinent imaging studies   Assessment/Plan: (ICD-10's: K30.20) 44 y.o. female with perforated sigmoid diverticulitis without obvious abscess, complicated bymultiplepertinent comorbidities includingimmunosuppression for psoriatic arthritis    - Plan on percutaneous drainage with IR today  - NPO for now  - IVF Resuscitation; monitor renal function  - Continue IV Abx (Zosyn --> Day 6)    - No emergent surgical intervention however she understands if she were to clinically deteriorate, she may require intervention  - Medical management of comorbidities  - DVT prophylaxis; hold for IR  All of the above findings and recommendations were discussed with the patient, and the medical team, and all of patient's questions were answered to her expressed satisfaction.  -- Edison Simon,  PA-C [facility] 09/09/2019, 7:43 AM [phone] M-F: 7am - 4pm

## 2019-09-09 NOTE — H&P (Signed)
Chief Complaint: Patient was seen in consultation today for diverticular abscess drainage at the request of Laqueta Due, PA-C  Referring Physician(s): Lynden Oxford, PA-C  Patient Status: [facility] - In-pt  History of Present Illness: Tanisia Chapman is a 44 y.o. female admitted 5 days ago with ruptured sigmoid diverticulitis with abscess. Predominantly air containing collection superior to proximal sigmoid colon on scan from 2/3 showed slight increase in size and more discrete margination by CT yesterday. New enhancing, marginated fluid collection medially in central mesentery. Patient with continued significant abdominal pain. WBC normal now on antibiotics.  Past Medical History:  Diagnosis Date  . Psoriatic arthritis (HCC)   . Stomach ulcer     History reviewed. No pertinent surgical history.  Allergies: Clonazepam, Ibuprofen, Nsaids, Pseudoephedrine, Tolmetin, and Pseudoephedrine hcl  Medications: Prior to Admission medications   Medication Sig Start Date End Date Taking? Authorizing Provider  ciprofloxacin (CIPRO) 500 MG tablet Take 500 mg by mouth 2 (two) times daily. 09/02/19  Yes [provider]  metroNIDAZOLE (FLAGYL) 500 MG tablet Take 500 mg by mouth 2 (two) times daily. 09/02/19  Yes [provider]  omeprazole (PRILOSEC) 40 MG capsule Take 40 mg by mouth 2 (two) times daily. 05/07/19  Yes [provider]  COSENTYX SENSOREADY, 300 MG, 150 MG/ML SOAJ Inject 300 mg as directed once a week. 08/28/19   [provider]  HUMIRA PEN 40 MG/0.8ML PNKT Inject 1 Syringe into the skin every 14 (fourteen) days. 07/22/19   [provider]  topiramate (TOPAMAX) 100 MG tablet Take 100 mg by mouth 2 (two) times daily. 05/07/19   [provider]     History reviewed. No pertinent family history.  Social History   Socioeconomic History  . Marital status: Married    Spouse name: Not on file  . Number of children: Not on file  .  Years of education: Not on file  . Highest education level: Not on file  Occupational History  . Not on file  Tobacco Use  . Smoking status: Never Smoker  . Smokeless tobacco: Never Used  Substance and Sexual Activity  . Alcohol use: Never  . Drug use: Never  . Sexual activity: Not on file  Other Topics Concern  . Not on file  Social History Narrative  . Not on file   Social Determinants of Health   Financial Resource Strain:   . Difficulty of Paying Living Expenses: Not on file  Food Insecurity:   . Worried About Programme researcher, broadcasting/film/video in the Last Year: Not on file  . Ran Out of Food in the Last Year: Not on file  Transportation Needs:   . Lack of Transportation (Medical): Not on file  . Lack of Transportation (Non-Medical): Not on file  Physical Activity:   . Days of Exercise per Week: Not on file  . Minutes of Exercise per Session: Not on file  Stress:   . Feeling of Stress : Not on file  Social Connections:   . Frequency of Communication with Friends and Family: Not on file  . Frequency of Social Gatherings with Friends and Family: Not on file  . Attends Religious Services: Not on file  . Active Member of Clubs or Organizations: Not on file  . Attends Banker Meetings: Not on file  . Marital Status: Not on file    Review of Systems: A 12 point ROS discussed and pertinent positives are indicated in the HPI above.  All other  systems are negative.  Review of Systems  Constitutional: Positive for chills and fever.  Respiratory: Negative.   Cardiovascular: Negative.   Gastrointestinal: Positive for abdominal distention and abdominal pain.  Genitourinary: Negative.   Musculoskeletal: Negative.   Neurological: Negative.     Vital Signs: BP (!) 158/108   Pulse 72   Temp 98.1 F (36.7 C) (Oral)   Resp 14   Ht 4\' 10"  (1.473 m)   Wt 72.6 kg   LMP 08/30/2019 Comment: neg preg test 09/02/19 at [facility]  SpO2 99%   BMI 33.45 kg/m   Physical Exam Vitals  reviewed.  Constitutional:      Appearance: She is well-developed. She is ill-appearing. She is not toxic-appearing or diaphoretic.  Cardiovascular:     Rate and Rhythm: Normal rate and regular rhythm.     Heart sounds: Normal heart sounds. No murmur. No friction rub. No gallop.   Pulmonary:     Effort: Pulmonary effort is normal. No respiratory distress.     Breath sounds: Normal breath sounds. No stridor. No wheezing, rhonchi or rales.  Abdominal:     General: Abdomen is protuberant. Bowel sounds are decreased.     Tenderness: There is abdominal tenderness in the left lower quadrant.     Hernia: No hernia is present.  Skin:    General: Skin is warm and dry.  Neurological:     General: No focal deficit present.     Mental Status: She is alert and oriented to person, place, and time.     Imaging: CT ABDOMEN PELVIS W CONTRAST  Result Date: 09/08/2019 CLINICAL DATA:  Perforated sigmoid diverticulitis follow-up. EXAM: CT ABDOMEN AND PELVIS WITH CONTRAST TECHNIQUE: Multidetector CT imaging of the abdomen and pelvis was performed using the standard protocol following bolus administration of intravenous contrast. CONTRAST:  11/06/2019 OMNIPAQUE IOHEXOL 300 MG/ML  SOLN COMPARISON:  CT abdomen pelvis dated September 03, 2019. FINDINGS: Lower chest: New trace right greater than left pleural effusions. Hepatobiliary: No focal liver abnormality is seen. No gallstones, gallbladder wall thickening, or biliary dilatation. Pancreas: Unremarkable. No pancreatic ductal dilatation or surrounding inflammatory changes. Spleen: Normal in size without focal abnormality. Adrenals/Urinary Tract: Adrenal glands are unremarkable. Kidneys are normal, without renal calculi, focal lesion, or hydronephrosis. Bladder is unremarkable. Stomach/Bowel: Perforated sigmoid diverticulitis again noted with slightly increased size of the previously seen gas/fluid collection superior to the perforation, now measuring 4.3 x 2.2 x 4.4 cm,  previously 4.2 x 2.0 x 3.7 cm. There is a new rim enhancing fluid collection in the central mesentery measuring 4.1 x 2.3 cm (series 2, image 43). Additional suspected early abscess formation anterior to a small bowel loop in the central abdomen, measuring 1.1 x 2.2 x 1.9 cm (series 2, image 40). Increased reactive wall thickening of the central small bowel. Increased central mesenteric fat stranding and interloop fluid. No obstruction. The stomach is within normal limits. Normal appendix. Vascular/Lymphatic: No significant vascular findings are present. No enlarged abdominal or pelvic lymph nodes. Reproductive: 3.5 cm benign-appearing cyst in the right ovary, previously 4.0 cm. Other: None. Musculoskeletal: Scattered injection related changes in the anterior abdominal wall. No acute or significant osseous findings. IMPRESSION: 1. Perforated sigmoid diverticulitis with slightly increased size of the previously seen gas/fluid collection superior to the perforation. 2. New 4.1 cm abscess in the central mesentery. Suspected additional smaller developing abscess in the nearby mesentery measuring up to 2.2 cm. 3. Increased reactive wall thickening of the central small bowel. 4. New trace right  greater than left pleural effusions. 5. Decreased size of a benign-appearing 3.5 cm cyst in the right ovary, previously 4.0 cm. No follow-up required. Electronically Signed   By: Titus Dubin M.D.   On: 09/08/2019 10:33   CT ABDOMEN PELVIS W CONTRAST  Result Date: 09/03/2019 CLINICAL DATA:  Acute onset lower abdominal pain starting today. EXAM: CT ABDOMEN AND PELVIS WITH CONTRAST TECHNIQUE: Multidetector CT imaging of the abdomen and pelvis was performed using the standard protocol following bolus administration of intravenous contrast. CONTRAST:  172mL OMNIPAQUE IOHEXOL 300 MG/ML  SOLN COMPARISON:  CT abdomen 08/03/2010 FINDINGS: Lower chest: Unremarkable Hepatobiliary: Unremarkable Pancreas: Unremarkable Spleen:  Unremarkable Adrenals/Urinary Tract: Unremarkable Stomach/Bowel: Colon perforation in the proximal sigmoid colon, with a small adjacent collection of gas and fluid as well as several additional small locules of gas scattered in the mesentery. The irregular collection of gas and fluid adjacent to the perforation site, which is probably due to a perforated diverticulum, measures about 4.2 by 2.0 cm on image 48/2, although the gas component is multilocular rather than representing a single abscess. Surrounding stranding in the mesentery tracking into the omentum. Vascular/Lymphatic: Unremarkable Reproductive: Simple appearing cystic right adnexal lesion 3.9 by 2.9 cm on image 59/2, most likely a a benign ovarian cyst. Other: Trace free pelvic fluid. Musculoskeletal: Unremarkable IMPRESSION: 1. Proximal sigmoid colon diverticulitis with perforation. There is an adjacent 4.2 by 2.0 cm collection of gas and fluid (somewhat multiloculated gas) along with some scattered small gas locules along the mesentery and other loops of bowel. 2. 3.9 by 2.9 cm right ovarian simple appearing cyst. No follow up is required based on the patient's age. 3. Trace free pelvic fluid. Critical Value/emergent results were called by telephone at the time of interpretation on 09/03/2019 at 6:30 pm to provider Dr. Lenise Arena , who verbally acknowledged these results. Electronically Signed   By: Van Clines M.D.   On: 09/03/2019 18:33    Labs:  CBC: Recent Labs    09/05/19 0518 09/06/19 0633 09/08/19 0907 09/09/19 0645  WBC 10.6* 8.7 6.7 6.3  HGB 11.8* 11.3* 12.4 12.0  HCT 35.3* 34.4* 36.7 35.4*  PLT 268 270 330 341    COAGS: Recent Labs    09/09/19 0645  INR 1.0  APTT 37*    BMP: Recent Labs    09/05/19 0518 09/06/19 0633 09/08/19 0907 09/09/19 0645  NA 138 138 134* 140  K 4.0 4.2 3.8 4.1  CL 110 111 105 110  CO2 20* 20* 16* 19*  GLUCOSE 92 77 59* 98  BUN 10 12 13 9   CALCIUM 7.8* 8.1* 8.5* 8.5*    CREATININE 1.19* 1.16* 1.02* 1.25*  GFRNONAA 55* 57* >60 52*  GFRAA >60 >60 >60 >60    LIVER FUNCTION TESTS: Recent Labs    09/03/19 1736  BILITOT 0.6  AST 21  ALT 21  ALKPHOS 86  PROT 8.1  ALBUMIN 3.9    Assessment and Plan:  Diverticular abscess containing mostly air appears amenable to percutaneous catheter drainage under CT guidance. The liquefied collection in central mesentery will be more difficult to drain depending on relationship of small bowel overlying collection at the time of the procedure. Have consented the patient for one, and possibly two percutaneous drain placements. Will proceed with drainage procedure today.  Risks and benefits discussed with the patient including bleeding, infection, damage to adjacent structures, bowel perforation/fistula connection, and sepsis. All of the patient's questions were answered, patient is agreeable to proceed. Consent signed  and in chart.  Thank you for this interesting consult.  I greatly enjoyed meeting Sara Chapman and look forward to participating in their care.  A copy of this report was sent to the requesting provider on this date.  Electronically Signed: Reola Calkins, MD 09/09/2019, 1:25 PM     I spent a total of 20 Minutes  in face to face in clinical consultation, greater than 50% of which was counseling/coordinating care for diverticular abscess drainage.

## 2019-09-09 NOTE — Procedures (Signed)
Interventional Radiology Procedure Note  Procedure: CT Guided Drainage of peritoneal abscesses x 2  Complications: None  Estimated Blood Loss: < 10 mL  Findings: 10 Fr drain placed in LLQ diverticular abscess with return of bloody fluid. Fluid sample sent for culture analysis. Drain attached to suction bulb drainage.  Second 10 Fr drain placed in more midline peritoneal abscess with return of bloody fluid. Fluid sample sent for culture analysis and drain attached to suction bulb.   Jodi Marble. Fredia Sorrow, M.D Pager:  [phone]

## 2019-09-10 LAB — BASIC METABOLIC PANEL
Anion gap: 10 (ref 5–15)
BUN: 6 mg/dL (ref 6–20)
CO2: 19 mmol/L — ABNORMAL LOW (ref 22–32)
Calcium: 8.1 mg/dL — ABNORMAL LOW (ref 8.9–10.3)
Chloride: 111 mmol/L (ref 98–111)
Creatinine, Ser: 1.11 mg/dL — ABNORMAL HIGH (ref 0.44–1.00)
GFR calc Af Amer: >60 mL/min (ref >60)
GFR calc non Af Amer: 60 mL/min — ABNORMAL LOW (ref >60)
Glucose, Bld: 140 mg/dL — ABNORMAL HIGH (ref 70–99)
Potassium: 3.7 mmol/L (ref 3.5–5.1)
Sodium: 140 mmol/L (ref 135–145)

## 2019-09-10 MED ORDER — ENSURE ENLIVE PO LIQD: 237.0000 mL | Freq: Two times a day (BID) | ORAL | Status: DC

## 2019-09-10 NOTE — Progress Notes (Addendum)
[facility] SURGICAL PROGRESS NOTE (cpt [phone])  Hospital Day(s): 7.   Interval History: Patient seen and examined, no acute events or new complaints overnight. Underwent percutaneous drain placement x2 yesterday. Patient reports that she has abdominal pain primarily at the insertion sites of her percutaneous drains. Her overall abdominal pain has improved. No fever, chills, nausea, or emesis. She has been on CLD and tolerated well.   Of note, she did note that every time she urinates she notices clotted blood in the bowl over the last day or so. Unsure if this is coming from the vagina or urethra. No reported blood per rectum or with stooling. She did complete her most recent menstrual cycle on 02/03. No issues like this in the past. Last Hgb was 12.0 (which is improved from 11.8 on admission).     Review of Systems:  Constitutional: denies fever, chills  HEENT: denies cough or congestion  Respiratory: denies any shortness of breath  Cardiovascular: denies chest pain or palpitations  Gastrointestinal: + abdominal pain, denied N/V, or diarrhea/and bowel function as per interval history Genitourinary: denies burning with urination or urinary frequency Musculoskeletal: denies pain, decreased motor or sensation   Vital signs in last 24 hours: [min-max] current  Temp:  [97.7 F (36.5 C)-98.3 F (36.8 C)] 98.3 F (36.8 C) (02/10 0453) Pulse Rate:  [72-85] 78 (02/10 0453) Resp:  [10-20] 18 (02/10 0453) BP: (118-173)/(79-119) 118/79 (02/10 0453) SpO2:  [95 %-100 %] 98 % (02/09 2027) Weight:  [72.6 kg] 72.6 kg (02/09 1303)     Height: 4\' 10"  (147.3 cm) Weight: 72.6 kg BMI (Calculated): 33.46   Intake/Output last 2 shifts:  02/09 0701 - 02/10 0700 In: 2612.5 [I.V.:2371; IV Piggyback:241.5] Out: 2445 [Urine:2425; Drains:20]   Physical Exam:  Constitutional: alert, cooperative and no distress, sitting up in bed  HENT: normocephalic without obvious abnormality  Eyes: PERRL,  EOM's grossly intact and symmetric  Respiratory: breathing non-labored at rest  Cardiovascular: regular rate and sinus rhythm  Gastrointestinal: soft, tenderness primarily at drain sites, overall abdominal tenderness improved, and non-distended. No rebound/guarding   Labs:  CBC Latest Ref Rng & Units 09/09/2019 09/08/2019 09/06/2019  WBC 4.0 - 10.5 K/uL 6.3 6.7 8.7  Hemoglobin 12.0 - 15.0 g/dL 11/04/2019 41.9 11.3(L)  Hematocrit 36.0 - 46.0 % 35.4(L) 36.7 34.4(L)  Platelets 150 - 400 K/uL 341 330 270   CMP Latest Ref Rng & Units 09/10/2019 09/09/2019 09/08/2019  Glucose 70 - 99 mg/dL 11/06/2019) 98 024(O)  BUN 6 - 20 mg/dL 6 9 13   Creatinine 0.44 - 1.00 mg/dL 97(D) ) 5.32(D)  Sodium 135 - 145 mmol/L 140 140 134(L)  Potassium 3.5 - 5.1 mmol/L 3.7 4.1 3.8  Chloride 98 - 111 mmol/L 111 110 105  CO2 22 - 32 mmol/L 19(L) 19(L) 16(L)  Calcium 8.9 - 10.3 mg/dL 8.1(L) 8.5(L) 8.5(L)  Total Protein 6.5 - 8.1 g/dL - - -  Total Bilirubin 0.3 - 1.2 mg/dL - - -  Alkaline Phos 38 - 126 U/L - - -  AST 15 - 41 U/L - - -  ALT 0 - 44 U/L - - -     Imaging studies: No new pertinent imaging studies   Assessment/Plan: (ICD-10's: K63.20) 44 y.o. female with perforated sigmoid diverticulitis without obvious abscess, complicated bymultiplepertinent comorbidities includingimmunosuppression for psoriatic arthritis   - Will advance to full liquid diet today   - Continue IVF resuscitation given slight AKI (sCr 1.11); monitor UO  - Continue IV Abx (Zosyn -->  Day 7)    - Pain control prn; antiemetics prn   - Repeat labs tomorrow  - No emergent surgical intervention however she understands if she were to clinically deteriorate, she may require intervention             - Medical management of comorbidities  - DVT prophylaxis   - Discharge Planning: If able to tolerate advancement of diet and pain continue to improve then hopefully home in next 48 hours.   All of the above findings and recommendations  were discussed with the patient, and the medical team, and all of patient's questions were answered to her expressed satisfaction.   -- Edison Simon, PA-C [facility] 09/10/2019, 8:09 AM [phone] M-F: 7am - 4pm

## 2019-09-11 LAB — MAGNESIUM: Magnesium: 1.9 mg/dL (ref 1.7–2.4)

## 2019-09-11 LAB — CBC
HCT: 35.7 % — ABNORMAL LOW (ref 36.0–46.0)
Hemoglobin: 11.8 g/dL — ABNORMAL LOW (ref 12.0–15.0)
MCH: 31.1 pg (ref 26.0–34.0)
MCHC: 33.1 g/dL (ref 30.0–36.0)
MCV: 94.2 fL (ref 80.0–100.0)
Platelets: 367 10*3/uL (ref 150–400)
RBC: 3.79 MIL/uL — ABNORMAL LOW (ref 3.87–5.11)
RDW: 12.4 % (ref 11.5–15.5)
WBC: 6.4 10*3/uL (ref 4.0–10.5)
nRBC: 0 % (ref 0.0–0.2)

## 2019-09-11 LAB — BASIC METABOLIC PANEL
Anion gap: 8 (ref 5–15)
BUN: <5 mg/dL — ABNORMAL LOW (ref 6–20)
CO2: 21 mmol/L — ABNORMAL LOW (ref 22–32)
Calcium: 8.3 mg/dL — ABNORMAL LOW (ref 8.9–10.3)
Chloride: 113 mmol/L — ABNORMAL HIGH (ref 98–111)
Creatinine, Ser: 1.16 mg/dL — ABNORMAL HIGH (ref 0.44–1.00)
GFR calc Af Amer: >60 mL/min (ref >60)
GFR calc non Af Amer: 57 mL/min — ABNORMAL LOW (ref >60)
Glucose, Bld: 122 mg/dL — ABNORMAL HIGH (ref 70–99)
Potassium: 4 mmol/L (ref 3.5–5.1)
Sodium: 142 mmol/L (ref 135–145)

## 2019-09-11 LAB — PHOSPHORUS: Phosphorus: 2.5 mg/dL (ref 2.5–4.6)

## 2019-09-11 NOTE — Progress Notes (Signed)
[facility] SURGICAL PROGRESS NOTE (cpt [id number])  Hospital Day(s): 8.   Interval History: Patient seen and examined, no acute events or new complaints overnight. Patient reports she continues to have pain primarily at the drain sites, unchanged. No fever, chills, nausea, or emesis. WBC remains in normal limits. Renal function is likely at baseline and she is making good urine. No electrolyte abnormalities. JP drains with 60 ccs out combined, one is serosanguinous, the other appears seropurulent. Tolerated full liquid diet well and is having bowel movements.   As for previous mentioned bleeding. Nurse attempted straight cath x3 to rule out hematuria however was unsuccessful. However, she did feel that she could visualize dried blood near the vaginal opening rather than the urethra. Hgb has remained stable at 11.8-12 throughout the duration of admission. I suspect this is related to her recent completion of her menstrual cycle and being on DVT prophylaxis.    Review of Systems:  Constitutional: denies fever, chills  HEENT: denies cough or congestion  Respiratory: denies any shortness of breath  Cardiovascular: denies chest pain or palpitations  Gastrointestinal: + abdominal pain, denied N/V, or diarrhea/and bowel function as per interval history Genitourinary: denies burning with urination or urinary frequency Musculoskeletal: denies pain, decreased motor or sensation   Vital signs in last 24 hours: [min-max] current  Temp:  [97.6 F (36.4 C)-98.7 F (37.1 C)] 98.1 F (36.7 C) (02/11 0526) Pulse Rate:  [79-88] 88 (02/11 0526) Resp:  [16-18] 18 (02/11 0526) BP: (120-134)/(82-94) 132/94 (02/11 0526) SpO2:  [97 %-98 %] 98 % (02/11 0526)     Height: 4\' 10"  (147.3 cm) Weight: 72.6 kg BMI (Calculated): 33.46   Intake/Output last 2 shifts:  02/10 0701 - 02/11 0700 In: 3827.3 [P.O.:600; I.V.:2925.7; IV Piggyback:281.6] Out: 1601 [Urine:2475; Drains:60; Stool:1]   Physical  Exam:  Constitutional: alert, cooperative and no distress, sitting up in bed  HENT: normocephalic without obvious abnormality  Eyes: PERRL, EOM's grossly intact and symmetric  Respiratory: breathing non-labored at rest  Cardiovascular: regular rate and sinus rhythm  Gastrointestinal: soft, tenderness primarily at drain sites, overall abdominal tenderness improved, and non-distended. No rebound/guarding. Surgical drain x2 to the midline, one with serosanguinous output, the other appears more seropurulent   Labs:  CBC Latest Ref Rng & Units 09/11/2019 09/09/2019 09/08/2019  WBC 4.0 - 10.5 K/uL 6.4 6.3 6.7  Hemoglobin 12.0 - 15.0 g/dL 11.8(L) 12.0 12.4  Hematocrit 36.0 - 46.0 % 35.7(L) 35.4(L) 36.7  Platelets 150 - 400 K/uL 367 341 330   CMP Latest Ref Rng & Units 09/11/2019 09/10/2019 09/09/2019  Glucose 70 - 99 mg/dL 122(H) 140(H) 98  BUN 6 - 20 mg/dL <5(L) 6 9  Creatinine 0.44 - 1.00 mg/dL 1.16(H) 1.11(H) 1.25(H)  Sodium 135 - 145 mmol/L 142 140 140  Potassium 3.5 - 5.1 mmol/L 4.0 3.7 4.1  Chloride 98 - 111 mmol/L 113(H) 111 110  CO2 22 - 32 mmol/L 21(L) 19(L) 19(L)  Calcium 8.9 - 10.3 mg/dL 8.3(L) 8.1(L) 8.5(L)  Total Protein 6.5 - 8.1 g/dL - - -  Total Bilirubin 0.3 - 1.2 mg/dL - - -  Alkaline Phos 38 - 126 U/L - - -  AST 15 - 41 U/L - - -  ALT 0 - 44 U/L - - -    Imaging studies: No new pertinent imaging studies   Assessment/Plan: (ICD-10's: K70.20) 44 y.o. female with perforated sigmoid diverticulitis without obvious abscess, complicated bymultiplepertinent comorbidities includingimmunosuppression for psoriatic arthritis   - Will advance to  soft diet today              - Will discontinue IVF today; renal function baseline & making good urine             - Continue IV Abx (Zosyn -->Day 8); transition to PO for home; follow up cultures              - Pain control prn; antiemetics prn              - No emergent surgical intervention however she understands if she were  toclinicallydeteriorate,she may require intervention - Medical management of comorbidities  - DVT prophylaxis              - Discharge Planning: Hopefully home tomorrow   All of the above findings and recommendations were discussed with the patient, and the medical team, and all of patient's questions were answered to her expressed satisfaction  -- Lynden Oxford, PA-C [facility] 09/11/2019, 7:27 AM [phone] M-F: 7am - 4pm

## 2019-09-12 MED ORDER — OXYCODONE HCL 5 MG PO TABS: 5.0000 mg | ORAL_TABLET | Freq: Four times a day (QID) | ORAL | 0 refills | Status: DC | PRN

## 2019-09-12 MED ORDER — ONDANSETRON 4 MG PO TBDP: 4.0000 mg | ORAL_TABLET | Freq: Four times a day (QID) | ORAL | 1 refills | Status: DC | PRN

## 2019-09-12 MED ORDER — AMOXICILLIN-POT CLAVULANATE 875-125 MG PO TABS: 1.0000 | ORAL_TABLET | Freq: Two times a day (BID) | ORAL | 0 refills | Status: DC

## 2019-09-12 NOTE — Discharge Summary (Signed)
[facility] SURGICAL DISCHARGE SUMMARY (cpt: [phone])  Patient ID: Sara Chapman MRN: [id number] DOB/AGE: February 09, 1976 44 y.o.  Admit date: 09/03/2019 Discharge date: 09/12/2019  Discharge Diagnoses Patient Active Problem List   Diagnosis Date Noted  . Diverticulitis of large intestine with perforation and abscess 09/03/2019    Consultants Interventional Radiology  Procedures 09/09/2019:  CT Guided Drainage of peritoneal abscesses x2 - Dr Fredia Sorrow, MD  HPI: 44 y.o. female presented to [facility] ED overnight for abdominal pain. Patient reports that she had the acute onset of left lower quadrant abdominal pain a few days ago. She presented to [facility] ED for this on 2/02. She was found to have sigmoid diverticulitis without perforation, leukocytosis to 12.5K, and a Klebsiella UTI. She was start on PO Ciprofloxacin and Flagyl and discharged home. She reports that she had done well following discharge and her abdominal pain had drastically improved. However, around 2:45 PM yesterday she reports the sudden onset of diffuse abdominal pain which had changed from previous days. This pain is sharp and she is unable to localize this. She has gotten some relief from pain medications in the ED and palpation worsens the pain. She reports of fever of 100.6 at home and endorses associated nausea and emesis with the pain. No cough, congestion, CP, SOB, or urinary changes. No history of constipation. She believes she was diagnosed with uncomplicated diverticulitis once in the past. Of note, she is on Cosentyx and Humira for psoriatic arthritis. Work up in the ED this last night and this morning showed a mild leukocytosis to 11K and likely perforated sigmoid diverticulitis with small amount of loculated pneumoperitoneum without organized abscess or fluid collection.     Hospital Course: She was admitted to the general surgery service and conservative management was attempted with NPO, IVF,  IV Abx (Zosyn), and pain control. She did well through the first 5 days of admission with fairly adequate pain control and resolution of her leukocytosis. She underwent repeat CT scan on `02/08 which showed concerns for two areas of fluid and gas concerning for intra-abdominal abscesses. Interventional radiology (Dr Fredia Sorrow) place percutaneous drains x2 on 02/09. Following this she noted improvement in her abdominal pain aside from the insertion sites of the drains. Her WBC remained within normal limits the remainder of her hospitalization. Advancement of patient's diet and ambulation were well-tolerated. The remainder of patient's hospital course was essentially unremarkable, and discharge planning was initiated accordingly with patient safely able to be discharged home with appropriate discharge instructions, antibiotics (Augmentin x 7 days - will follow up cultures from drain placement), pain control, and outpatient follow-up after all of her questions were answered to her expressed satisfaction.   Discharge Condition: Good   Physical Examination:  Constitutional: alert, cooperative and no distress, sitting up in bed HENT: normocephalic without obvious abnormality  Eyes: PERRL, EOM's grossly intact and symmetric  Respiratory: breathing non-labored at rest  Cardiovascular: regular rate and sinus rhythm  Gastrointestinal: soft,tenderness primarily at drain sites, overall abdominal tenderness improved, and non-distended. No rebound/guarding. Surgical drain x2 to the midline, one with serosanguinous output, the other appears more serous this morning    Allergies as of 09/12/2019      Reactions   Clonazepam Other (See Comments)   SUICIDAL THOUGHTS SUICIDAL THOUGHTS SUICIDAL THOUGHTS   Ibuprofen Other (See Comments)   stomach ulcers stomach ulcers stomach ulcers stomach ulcers   Nsaids Other (See Comments)   GI ulcers   Pseudoephedrine Other (See Comments)   palpitations  palpitations    Tolmetin Other (See Comments)   GI ulcers GI ulcers   Pseudoephedrine Hcl Other (See Comments), Palpitations   Unable to tolerate Unable to tolerate   Tape Rash   Paper tape breaks down the skin and causes sores      Medication List    STOP taking these medications   ciprofloxacin 500 MG tablet Commonly known as: CIPRO   metroNIDAZOLE 500 MG tablet Commonly known as: FLAGYL     TAKE these medications   amoxicillin-clavulanate 875-125 MG tablet Commonly known as: Augmentin Take 1 tablet by mouth 2 (two) times daily for 7 days.   Cosentyx Sensoready (300 MG) 150 MG/ML Soaj Generic drug: Secukinumab (300 MG Dose) Inject 300 mg as directed once a week.   Humira Pen 40 MG/0.8ML Pnkt Generic drug: Adalimumab Inject 1 Syringe into the skin every 14 (fourteen) days.   omeprazole 40 MG capsule Commonly known as: PRILOSEC Take 40 mg by mouth 2 (two) times daily.   ondansetron 4 MG disintegrating tablet Commonly known as: ZOFRAN-ODT Take 1 tablet (4 mg total) by mouth every 6 (six) hours as needed for nausea.   oxyCODONE 5 MG immediate release tablet Commonly known as: Oxy IR/ROXICODONE Take 1 tablet (5 mg total) by mouth every 6 (six) hours as needed for severe pain or breakthrough pain.   topiramate 100 MG tablet Commonly known as: TOPAMAX Take 100 mg by mouth 2 (two) times daily.        Follow-up Information    Ronny Bacon, MD. Schedule an appointment as soon as possible for a visit in 1 week(s).   Specialty: Surgery Why: Follow up early next week with Dr Christian Mate, recent hospitlization for diverticulitis, has drain x2 Contact information: [address] [place] [address] [phone]            Time spent on discharge management including discussion of hospital course, clinical condition, outpatient instructions, prescriptions, and follow up with the patient and members of the medical team: >30 minutes  -- Edison Simon ,  PA-C [facility]  09/12/2019, 10:09 AM [phone] M-F: 7am - 4pm

## 2019-09-12 NOTE — Progress Notes (Signed)
Pt discharged per MD order. IV removed. Pt instructed on and had JP education demonstrated. Pt and her husband verbalized understanding. All questions answered to pt satisfaction. Pt taken to car in wheelchair.

## 2019-09-14 LAB — AEROBIC/ANAEROBIC CULTURE W GRAM STAIN (SURGICAL/DEEP WOUND)
Culture: NO GROWTH
Special Requests: NORMAL
Special Requests: NORMAL

## 2019-09-16 ENCOUNTER — Emergency Department: Payer: BC Managed Care – PPO

## 2019-09-16 ENCOUNTER — Ambulatory Visit (INDEPENDENT_AMBULATORY_CARE_PROVIDER_SITE_OTHER): Payer: BC Managed Care – PPO | Admitting: Surgery

## 2019-09-16 ENCOUNTER — Encounter: Payer: Self-pay | Admitting: Surgery

## 2019-09-16 ENCOUNTER — Inpatient Hospital Stay
Admission: EM | Admit: 2019-09-16 | Discharge: 2019-09-19 | DRG: 392 | Disposition: A | Discharge status: Home / Self Care | Payer: BC Managed Care – PPO | Attending: Surgery | Admitting: Surgery

## 2019-09-16 ENCOUNTER — Other Ambulatory Visit: Payer: Self-pay

## 2019-09-16 ENCOUNTER — Other Ambulatory Visit
Admission: RE | Admit: 2019-09-16 | Discharge: 2019-09-16 | Disposition: A | Discharge status: Home / Self Care | Payer: BC Managed Care – PPO | Source: Ambulatory Visit | Attending: Surgery | Admitting: Surgery

## 2019-09-16 VITALS — BP 135/95 | HR 106 | Temp 95.2°F | Resp 12 | Ht <= 58 in | Wt 152.2 lb

## 2019-09-16 DIAGNOSIS — L4050 Arthropathic psoriasis, unspecified: Secondary | ICD-10-CM | POA: Diagnosis present

## 2019-09-16 DIAGNOSIS — Z20822 Contact with and (suspected) exposure to covid-19: Secondary | ICD-10-CM | POA: Diagnosis present

## 2019-09-16 DIAGNOSIS — K5720 Diverticulitis of large intestine with perforation and abscess without bleeding: Secondary | ICD-10-CM | POA: Insufficient documentation

## 2019-09-16 DIAGNOSIS — K5792 Diverticulitis of intestine, part unspecified, without perforation or abscess without bleeding: Secondary | ICD-10-CM

## 2019-09-16 DIAGNOSIS — Z79899 Other long term (current) drug therapy: Secondary | ICD-10-CM | POA: Diagnosis not present

## 2019-09-16 DIAGNOSIS — Z888 Allergy status to other drugs, medicaments and biological substances status: Secondary | ICD-10-CM

## 2019-09-16 DIAGNOSIS — Z8711 Personal history of peptic ulcer disease: Secondary | ICD-10-CM

## 2019-09-16 DIAGNOSIS — K5732 Diverticulitis of large intestine without perforation or abscess without bleeding: Secondary | ICD-10-CM | POA: Diagnosis present

## 2019-09-16 DIAGNOSIS — R109 Unspecified abdominal pain: Secondary | ICD-10-CM

## 2019-09-16 DIAGNOSIS — Z886 Allergy status to analgesic agent status: Secondary | ICD-10-CM | POA: Diagnosis not present

## 2019-09-16 DIAGNOSIS — A419 Sepsis, unspecified organism: Secondary | ICD-10-CM

## 2019-09-16 HISTORY — DX: Perforation of intestine (nontraumatic): K63.1

## 2019-09-16 LAB — PROTIME-INR
INR: 1.1 (ref 0.8–1.2)
Prothrombin Time: 13.8 seconds (ref 11.4–15.2)

## 2019-09-16 LAB — CBC WITH DIFFERENTIAL/PLATELET
Abs Immature Granulocytes: 0.05 10*3/uL (ref 0.00–0.07)
Abs Immature Granulocytes: 0.13 10*3/uL — ABNORMAL HIGH (ref 0.00–0.07)
Basophils Absolute: 0 10*3/uL (ref 0.0–0.1)
Basophils Absolute: 0.1 10*3/uL (ref 0.0–0.1)
Basophils Relative: 0 %
Basophils Relative: 0 %
Eosinophils Absolute: 0.1 10*3/uL (ref 0.0–0.5)
Eosinophils Absolute: 0 10*3/uL (ref 0.0–0.5)
Eosinophils Relative: 1 %
Eosinophils Relative: 0 %
HCT: 42 % (ref 36.0–46.0)
HCT: 40.8 % (ref 36.0–46.0)
Hemoglobin: 14.1 g/dL (ref 12.0–15.0)
Hemoglobin: 13.9 g/dL (ref 12.0–15.0)
Immature Granulocytes: 0 %
Immature Granulocytes: 1 %
Lymphocytes Relative: 14 %
Lymphocytes Relative: 5 %
Lymphs Abs: 2 10*3/uL (ref 0.7–4.0)
Lymphs Abs: 1.1 10*3/uL (ref 0.7–4.0)
MCH: 31.3 pg (ref 26.0–34.0)
MCH: 31 pg (ref 26.0–34.0)
MCHC: 33.6 g/dL (ref 30.0–36.0)
MCHC: 34.1 g/dL (ref 30.0–36.0)
MCV: 93.1 fL (ref 80.0–100.0)
MCV: 91.1 fL (ref 80.0–100.0)
Monocytes Absolute: 0.4 10*3/uL (ref 0.1–1.0)
Monocytes Absolute: 0.7 10*3/uL (ref 0.1–1.0)
Monocytes Relative: 3 %
Monocytes Relative: 3 %
Neutro Abs: 11.5 10*3/uL — ABNORMAL HIGH (ref 1.7–7.7)
Neutro Abs: 22.8 10*3/uL — ABNORMAL HIGH (ref 1.7–7.7)
Neutrophils Relative %: 82 %
Neutrophils Relative %: 91 %
Platelets: 464 10*3/uL — ABNORMAL HIGH (ref 150–400)
Platelets: 478 10*3/uL — ABNORMAL HIGH (ref 150–400)
RBC: 4.51 MIL/uL (ref 3.87–5.11)
RBC: 4.48 MIL/uL (ref 3.87–5.11)
RDW: 12.5 % (ref 11.5–15.5)
RDW: 12.5 % (ref 11.5–15.5)
WBC: 14.2 10*3/uL — ABNORMAL HIGH (ref 4.0–10.5)
WBC: 24.9 10*3/uL — ABNORMAL HIGH (ref 4.0–10.5)
nRBC: 0 % (ref 0.0–0.2)
nRBC: 0 % (ref 0.0–0.2)

## 2019-09-16 LAB — COMPREHENSIVE METABOLIC PANEL
ALT: 26 U/L (ref 0–44)
AST: 31 U/L (ref 15–41)
Albumin: 3.6 g/dL (ref 3.5–5.0)
Alkaline Phosphatase: 136 U/L — ABNORMAL HIGH (ref 38–126)
Anion gap: 12 (ref 5–15)
BUN: 11 mg/dL (ref 6–20)
CO2: 21 mmol/L — ABNORMAL LOW (ref 22–32)
Calcium: 9.2 mg/dL (ref 8.9–10.3)
Chloride: 105 mmol/L (ref 98–111)
Creatinine, Ser: 0.99 mg/dL (ref 0.44–1.00)
GFR calc Af Amer: >60 mL/min (ref >60)
GFR calc non Af Amer: >60 mL/min (ref >60)
Glucose, Bld: 117 mg/dL — ABNORMAL HIGH (ref 70–99)
Potassium: 3.9 mmol/L (ref 3.5–5.1)
Sodium: 138 mmol/L (ref 135–145)
Total Bilirubin: 1 mg/dL (ref 0.3–1.2)
Total Protein: 7.9 g/dL (ref 6.5–8.1)

## 2019-09-16 LAB — HCG, QUANTITATIVE, PREGNANCY: hCG, Beta Chain, Quant, S: <1 m[IU]/mL (ref <5)

## 2019-09-16 LAB — RESPIRATORY PANEL BY RT PCR (FLU A&B, COVID)
Influenza A by PCR: NEGATIVE
Influenza B by PCR: NEGATIVE
SARS Coronavirus 2 by RT PCR: NEGATIVE

## 2019-09-16 LAB — LACTIC ACID, PLASMA: Lactic Acid, Venous: 1.7 mmol/L (ref 0.5–1.9)

## 2019-09-16 MED ORDER — ONDANSETRON 4 MG PO TBDP: 4.0000 mg | ORAL_TABLET | Freq: Four times a day (QID) | ORAL | Status: DC | PRN

## 2019-09-16 MED ORDER — SODIUM CHLORIDE 0.9 % IV SOLN: INTRAVENOUS | Status: DC

## 2019-09-16 MED ORDER — SODIUM CHLORIDE 0.9 % IV BOLUS (SEPSIS)
1000.0000 mL | Freq: Once | INTRAVENOUS | Status: AC
  Administered 2019-09-16: 1000 mL via INTRAVENOUS

## 2019-09-16 MED ORDER — ACETAMINOPHEN 500 MG PO TABS
1000.0000 mg | ORAL_TABLET | Freq: Four times a day (QID) | ORAL | Status: DC
  Administered 2019-09-17 – 2019-09-19 (×9): 1000 mg via ORAL
  Filled 2019-09-16 (×12): qty 2

## 2019-09-16 MED ORDER — ONDANSETRON HCL 4 MG/2ML IJ SOLN
4.0000 mg | Freq: Once | INTRAMUSCULAR | Status: AC
  Administered 2019-09-16: 4 mg via INTRAVENOUS
  Filled 2019-09-16: qty 2

## 2019-09-16 MED ORDER — DIPHENHYDRAMINE HCL 50 MG/ML IJ SOLN: 12.5000 mg | Freq: Four times a day (QID) | INTRAMUSCULAR | Status: DC | PRN

## 2019-09-16 MED ORDER — DIPHENHYDRAMINE HCL 12.5 MG/5ML PO ELIX
12.5000 mg | ORAL_SOLUTION | Freq: Four times a day (QID) | ORAL | Status: DC | PRN
  Filled 2019-09-16: qty 5

## 2019-09-16 MED ORDER — SODIUM CHLORIDE 0.9 % IV SOLN
2.0000 g | Freq: Once | INTRAVENOUS | Status: AC
  Administered 2019-09-16: 2 g via INTRAVENOUS
  Filled 2019-09-16: qty 2

## 2019-09-16 MED ORDER — ONDANSETRON HCL 4 MG/2ML IJ SOLN
4.0000 mg | Freq: Four times a day (QID) | INTRAMUSCULAR | Status: DC | PRN
  Administered 2019-09-19: 4 mg via INTRAVENOUS
  Filled 2019-09-16: qty 2

## 2019-09-16 MED ORDER — HEPARIN SODIUM (PORCINE) 5000 UNIT/ML IJ SOLN
5000.0000 [IU] | Freq: Three times a day (TID) | INTRAMUSCULAR | Status: DC
  Administered 2019-09-16 – 2019-09-19 (×8): 5000 [IU] via SUBCUTANEOUS
  Filled 2019-09-16 (×8): qty 1

## 2019-09-16 MED ORDER — IOHEXOL 300 MG/ML  SOLN
100.0000 mL | Freq: Once | INTRAMUSCULAR | Status: AC | PRN
  Administered 2019-09-16: 100 mL via INTRAVENOUS

## 2019-09-16 MED ORDER — HYDROMORPHONE HCL 1 MG/ML IJ SOLN
1.0000 mg | Freq: Once | INTRAMUSCULAR | Status: AC
  Administered 2019-09-16: 1 mg via INTRAVENOUS
  Filled 2019-09-16: qty 1

## 2019-09-16 MED ORDER — PROCHLORPERAZINE EDISYLATE 10 MG/2ML IJ SOLN
5.0000 mg | Freq: Four times a day (QID) | INTRAMUSCULAR | Status: DC | PRN
  Administered 2019-09-16: 10 mg via INTRAVENOUS
  Filled 2019-09-16: qty 2

## 2019-09-16 MED ORDER — METRONIDAZOLE IN NACL 5-0.79 MG/ML-% IV SOLN
500.0000 mg | Freq: Once | INTRAVENOUS | Status: AC
  Administered 2019-09-16: 500 mg via INTRAVENOUS
  Filled 2019-09-16: qty 100

## 2019-09-16 MED ORDER — PROCHLORPERAZINE MALEATE 10 MG PO TABS
10.0000 mg | ORAL_TABLET | Freq: Four times a day (QID) | ORAL | Status: DC | PRN
  Filled 2019-09-16: qty 1

## 2019-09-16 MED ORDER — SODIUM CHLORIDE 0.9 % IV BOLUS (SEPSIS)
250.0000 mL | Freq: Once | INTRAVENOUS | Status: AC
  Administered 2019-09-16: 17:00:00 250 mL via INTRAVENOUS

## 2019-09-16 MED ORDER — GABAPENTIN 600 MG PO TABS
600.0000 mg | ORAL_TABLET | Freq: Three times a day (TID) | ORAL | Status: DC
  Administered 2019-09-17 – 2019-09-19 (×7): 600 mg via ORAL
  Filled 2019-09-16 (×8): qty 1

## 2019-09-16 MED ORDER — HYDROMORPHONE HCL 1 MG/ML IJ SOLN
1.0000 mg | INTRAMUSCULAR | Status: DC | PRN
  Administered 2019-09-16: 1 mg via INTRAVENOUS
  Filled 2019-09-16: qty 1

## 2019-09-16 MED ORDER — HYDROMORPHONE HCL 1 MG/ML IJ SOLN
1.0000 mg | Freq: Once | INTRAMUSCULAR | Status: AC | PRN
  Administered 2019-09-16: 1 mg via INTRAVENOUS
  Filled 2019-09-16: qty 1

## 2019-09-16 MED ORDER — TOPIRAMATE 100 MG PO TABS
100.0000 mg | ORAL_TABLET | Freq: Two times a day (BID) | ORAL | Status: DC
  Administered 2019-09-17 – 2019-09-19 (×6): 100 mg via ORAL
  Filled 2019-09-16 (×3): qty 1
  Filled 2019-09-16 (×5): qty 4

## 2019-09-16 MED ORDER — PANTOPRAZOLE SODIUM 40 MG PO TBEC
40.0000 mg | DELAYED_RELEASE_TABLET | Freq: Every day | ORAL | Status: DC
  Administered 2019-09-16 – 2019-09-19 (×4): 40 mg via ORAL
  Filled 2019-09-16 (×3): qty 1

## 2019-09-16 MED ORDER — PIPERACILLIN-TAZOBACTAM 3.375 G IVPB
3.3750 g | Freq: Three times a day (TID) | INTRAVENOUS | Status: DC
  Administered 2019-09-17 – 2019-09-19 (×8): 3.375 g via INTRAVENOUS
  Filled 2019-09-16 (×8): qty 50

## 2019-09-16 MED ORDER — OXYCODONE HCL 5 MG PO TABS
5.0000 mg | ORAL_TABLET | Freq: Four times a day (QID) | ORAL | Status: DC | PRN
  Administered 2019-09-17: 5 mg via ORAL
  Filled 2019-09-16 (×2): qty 1

## 2019-09-16 NOTE — Progress Notes (Signed)
CODE SEPSIS - PHARMACY COMMUNICATION  **Broad Spectrum Antibiotics should be administered within 1 hour of Sepsis diagnosis**  Time Code Sepsis Called/Page Received: 1632  Antibiotics Ordered: Cefepime,Flagyl  Time of 1st antibiotic administration: 1702  Additional action taken by pharmacy: none  If necessary, Name of Provider/Nurse Contacted: n/a    Bettey Costa ,PharmD Clinical Pharmacist  09/16/2019  5:33 PM

## 2019-09-16 NOTE — Progress Notes (Signed)
PHARMACY -  BRIEF ANTIBIOTIC NOTE   Pharmacy has received consult(s) for Cefepime from an ED provider.  The patient's profile has been reviewed for ht/wt/allergies/indication/available labs.    One time order(s) placed for Cefepime 2g IV x 1  Further antibiotics/pharmacy consults should be ordered by admitting physician if indicated.                       Thank you, Bettey Costa 09/16/2019  4:31 PM

## 2019-09-16 NOTE — Progress Notes (Addendum)
Surgical Clinic Progress/Follow-up Note   44 y.o. Female presents to clinic for outpatient follow-up   less than a weeks follow the last evaluation. Patient reports no remarkable improvement/resolution of prior issues and has been minimally tolerating regular diet with poor appetite +flatus and "diarrhea in spots", denies N/V, fever/chills, CP, or SOB.  Review of Systems:  Constitutional: denies fever/chills  ENT: denies sore throat, hearing problems  Respiratory: denies shortness of breath, wheezing  Cardiovascular: denies chest pain, palpitations  Gastrointestinal: Left lower quadrant abdominal pain. Skin: Denies any other rashes or skin discolorations  Vital Signs:  BP (!) 135/95   Pulse (!) 106   Temp (!) 95.2 F (35.1 C) (Temporal)   Resp 12   Ht 4\' 10"  (1.473 m)   Wt 152 lb 3.2 oz (69 kg)   LMP 08/30/2019 Comment: neg preg test at [facility] 09/02/19  SpO2 98%   BMI 31.81 kg/m    Physical Exam:  Constitutional:  -- Normal/Obese diarrhea habitus  -- Awake, alert, and oriented x3  Pulmonary:  -- No crackles -- Equal breath sounds bilaterally -- Breathing non-labored at rest Cardiovascular:  -- S1, S2 present  -- No pericardial rubs  Gastrointestinal:  -- Soft and non-distended, the drain which goes to the patient's right remains serous as usual with minimal drainage in bulb which has not been drained overnight.  The drain which goes to the patient's left has seropurulent fluid, less than 20 mL for the last 24 hours with good bulb patency and no evidence of air or fistula.   We removed the drain with trace of serous fluid which was on the pt's right side.  Musculoskeletal / Integumentary:  -- Wounds or skin discoloration: None appreciated -- Extremities: B/L UE and LE FROM, hands and feet warm, no edema     Imaging: No new pertinent imaging available for review   Assessment:  44 y.o. yo Female with a problem list including...  Patient Active Problem List   Diagnosis  Date Noted  . Diverticulitis of large intestine with perforation and abscess 09/03/2019    presents to clinic for follow-up evaluation of drains following percutaneous perforated diverticular abscess/fluid collection drainages, progressing well.  Plan:  -We will repeat CT scan later this week, continue current drain.  Complete course of Augmentin and then resume Cipro Flagyl, to continue this course well drain present.             - return to clinic next week or as needed, instructed to call office if any questions or concerns  All of the above recommendations were discussed with the patient and patient's family, and all of patient's and family's questions were answered to their expressed satisfaction.  11/01/2019, MD, FACS [place]: [facility] General Surgery - Partnering for exceptional care. Office: [phone]

## 2019-09-16 NOTE — Patient Instructions (Addendum)
Dr.Rodenberg removed one of the two drains at today's visit with patient.  Dr.Rodenberg also recommended patient to have a repeat lab work (CBC) and CT Abdomen Pelvis w Contrast.  Patient will go to Outpatient Imaging to pick up the prep kit and instructions. Patient is scheduled for [facility], Friday 09/19/19 at 11 am. 10:45 am arrival. NPO for 4 hours prior. Needs to pick up a prep kit.   Dr.Rodenberg to advised patient to have a discussion with Rheumatologist about current medications.  

## 2019-09-16 NOTE — ED Provider Notes (Signed)
[facility] Emergency Department Provider Note  ____________________________________________   First MD Initiated Contact with Patient 09/16/19 1608     (approximate)  I have reviewed the triage vital signs and the nursing notes.   HISTORY  Chief Complaint Abdominal Pain    HPI Sara Chapman is a 44 y.o. female with psoriatic arthritis on Cosentyx and Humira, recent admission from 2/3 until 2/13 due to sigmoid diverticulitis with pneumoperitoneum.  During this admission patient was found to have intra-abdominal abscesses and 2 drains were placed.  Patient stated 1 drain was removed today.  Patient has been on antibiotics.  Patient is currently on Augmentin.  Patient had follow-up in clinic today.  She stated that she did have some abdominal pain but the pain seemed to have gotten worse.  The pain is generalized, constant, nothing makes better, nothing makes it worse.  However patient stated that after she got home she started develop fevers up to 100.2 and worsening pain which is why she returned to the ER for reevaluation.  She denies any shortness of breath, chest pain, Covid contacts.          Past Medical History:  Diagnosis Date  . Bowel perforation (HCC)   . Psoriatic arthritis (HCC)   . Stomach ulcer     Patient Active Problem List   Diagnosis Date Noted  . Diverticulitis of large intestine with perforation and abscess 09/03/2019    History reviewed. No pertinent surgical history.  Prior to Admission medications   Medication Sig Start Date End Date Taking? Authorizing Provider  amoxicillin-clavulanate (AUGMENTIN) 875-125 MG tablet Take 1 tablet by mouth 2 (two) times daily for 7 days. 09/12/19 09/19/19  Donovan Kail, PA-C  COSENTYX SENSOREADY, 300 MG, 150 MG/ML SOAJ Inject 300 mg as directed once a week. 08/28/19   [provider]  HUMIRA PEN 40 MG/0.8ML PNKT Inject 1 Syringe into the skin every 14 (fourteen) days. 07/22/19    [provider]  omeprazole (PRILOSEC) 40 MG capsule Take 40 mg by mouth 2 (two) times daily. 05/07/19   [provider]  ondansetron (ZOFRAN-ODT) 4 MG disintegrating tablet Take 1 tablet (4 mg total) by mouth every 6 (six) hours as needed for nausea. 09/12/19   Donovan Kail, PA-C  oxyCODONE (OXY IR/ROXICODONE) 5 MG immediate release tablet Take 1 tablet (5 mg total) by mouth every 6 (six) hours as needed for severe pain or breakthrough pain. 09/12/19   Donovan Kail, PA-C  topiramate (TOPAMAX) 100 MG tablet Take 100 mg by mouth 2 (two) times daily. 05/07/19   [provider]    Allergies Clonazepam, Ibuprofen, Nsaids, Pseudoephedrine, Tolmetin, Pseudoephedrine hcl, and Tape  No family history on file.  Social History Social History   Tobacco Use  . Smoking status: Never Smoker  . Smokeless tobacco: Never Used  Substance Use Topics  . Alcohol use: Never  . Drug use: Never      Review of Systems Constitutional: Positive fevers Eyes: No visual changes. ENT: No sore throat. Cardiovascular: Denies chest pain. Respiratory: Denies shortness of breath. Gastrointestinal: Positive abdominal pain, nausea, diarrhea but that has been present since surgery Genitourinary: Negative for dysuria. Musculoskeletal: Negative for back pain. Skin: Negative for rash. Neurological: Negative for headaches, focal weakness or numbness. All other ROS negative ____________________________________________   PHYSICAL EXAM:  VITAL SIGNS: ED Triage Vitals [09/16/19 1545]  Enc Vitals Group     BP (!) 145/98     Pulse Rate Marland Kitchen)  116     Resp (!) 22     Temp 99.4 F (37.4 C)     Temp src      SpO2 99 %     Weight 152 lb (68.9 kg)     Height 4\' 10"  (1.473 m)     Head Circumference      Peak Flow      Pain Score 10     Pain Loc      Pain Edu?      Excl. in GC?     Constitutional: Alert and oriented.  Appears in pain curled up on the bed Eyes: Conjunctivae are  normal. EOMI. Head: Atraumatic. Nose: No congestion/rhinnorhea. Mouth/Throat: Mucous membranes are moist.   Neck: No stridor. Trachea Midline. FROM Cardiovascular: Normal rate, regular rhythm. Grossly normal heart sounds.  Good peripheral circulation. Respiratory: Normal respiratory effort.  No retractions. Lungs CTAB. Gastrointestinal: Significantly tender in the lower abdomen.  JP drain. no distention. No abdominal bruits.  Musculoskeletal: No lower extremity tenderness nor edema.  No joint effusions. Neurologic:  Normal speech and language. No gross focal neurologic deficits are appreciated.  Skin:  Skin is warm, dry and intact. No rash noted. Psychiatric: Mood and affect are normal. Speech and behavior are normal. GU: Deferred   ____________________________________________   LABS (all labs ordered are listed, but only abnormal results are displayed)  Labs Reviewed  CULTURE, BLOOD (ROUTINE X 2)  CULTURE, BLOOD (ROUTINE X 2)  RESPIRATORY PANEL BY RT PCR (FLU A&B, COVID)  COMPREHENSIVE METABOLIC PANEL  LACTIC ACID, PLASMA  LACTIC ACID, PLASMA  CBC WITH DIFFERENTIAL/PLATELET  PROTIME-INR  URINALYSIS, COMPLETE (UACMP) WITH MICROSCOPIC  HCG, QUANTITATIVE, PREGNANCY  POC URINE PREG, ED   ______ RADIOLOGY , personally viewed and evaluated these images (plain radiographs) as part of my medical decision making, as well as reviewing the written report by the radiologist.  ED MD interpretation: No pneumonia  Official radiology report(s): DG Chest 1 View  Result Date: 09/16/2019 CLINICAL DATA:  Sepsis EXAM: CHEST  1 VIEW COMPARISON:  04/21/2013 FINDINGS: The heart size and mediastinal contours are within normal limits. Both lungs are clear. The visualized skeletal structures are unremarkable. IMPRESSION: No active disease. Electronically Signed   By: 04/23/2013 M.D.   On: 09/16/2019 16:40   CT ABDOMEN PELVIS W CONTRAST  Result Date: 09/16/2019 CLINICAL DATA:   Abdominal pain and fever. Prior bowel perforation and abscess formation. Post percutaneous drainage catheter placement. One of 2 drains removed today. EXAM: CT ABDOMEN AND PELVIS WITH CONTRAST TECHNIQUE: Multidetector CT imaging of the abdomen and pelvis was performed using the standard protocol following bolus administration of intravenous contrast. CONTRAST:  09/18/2019 OMNIPAQUE IOHEXOL 300 MG/ML  SOLN COMPARISON:  CT 09/08/2019 procedural CT 09/09/2019 FINDINGS: Lower chest: Lung bases are clear. Hepatobiliary: No focal hepatic lesion. No biliary duct dilatation. Gallbladder is normal. Common bile duct is normal. Pancreas: Pancreas is normal. No ductal dilatation. No pancreatic inflammation. Spleen: Normal spleen Adrenals/urinary tract: Adrenal glands and kidneys are normal. The ureters and bladder normal. Stomach/Bowel: Stomach, duodenum normal. Within the leaves of the mesentery there is residual mild inflammation. Percutaneous drainage catheter extends into the LEFT mid abdomen. Interval resolution of the gas pocket associated with the LEFT pigtail catheter. No organized fluid collections adjacent to the LEFT catheter. There is mild inflammation of the sigmoid colon adjacent the LEFT catheter at site of prior diverticulitis. Small amount of gas superficial to the rectus muscle on the LEFT associated  with the drainage catheter (image 44/2). A second fluid collection midline in the mesentery has an enhancing rim measuring 2.1 x 1.6 cm. This collection is decreased in size from 4.1 x 2.2 cm. There was a drainage catheter within this collection on comparison CT from 09/09/2019. No bowel obstruction. Appendix and terminal ileum normal. Ascending, transverse and descending colon normal. Rectosigmoid colon normal. Mild inflammation of the proximal sigmoid colon. Vascular/Lymphatic: Abdominal aorta is normal caliber. No periportal or retroperitoneal adenopathy. No pelvic adenopathy. Reproductive: Uterus and adnexa normal  Other: No free fluid. Musculoskeletal: No aggressive osseous lesion. IMPRESSION: 1. Small residual abscess in the central mesentery decreased in size from comparison exam. Pigtail drainage catheter associated with this small abscess (2 cm) has been removed. 2. Second drainage catheter in the LEFT mid abdomen with no measurable fluid collection associated with this catheter. There is small amount gas superficial to the rectus muscle in the LEFT abdominal wall along the catheter tract. 3. Mild inflammation along the proximal sigmoid colon at site of prior diverticulitis. Electronically Signed   By: Suzy Bouchard M.D.   On: 09/16/2019 17:44    ____________________________________________   PROCEDURES  Procedure(s) performed (including Critical Care):  .Critical Care Performed by: Vanessa [place], MD Authorized by: Vanessa [place], MD   Critical care provider statement:    Critical care time (minutes):  45   Critical care was necessary to treat or prevent imminent or life-threatening deterioration of the following conditions:  Sepsis   Critical care was time spent personally by me on the following activities:  Discussions with consultants, evaluation of patient's response to treatment, examination of patient, ordering and performing treatments and interventions, ordering and review of laboratory studies, ordering and review of radiographic studies, pulse oximetry, re-evaluation of patient's condition, obtaining history from patient or surrogate and review of old charts     ____________________________________________   INITIAL IMPRESSION / ASSESSMENT AND PLAN / ED COURSE  Sara Chapman was evaluated in Emergency Department on 09/16/2019 for the symptoms described in the history of present illness. She was evaluated in the context of the global COVID-19 pandemic, which necessitated consideration that the patient might be at risk for infection with the SARS-CoV-2 virus that causes COVID-19.  Institutional protocols and algorithms that pertain to the evaluation of patients at risk for COVID-19 are in a state of rapid change based on information released by regulatory bodies including the CDC and federal and state organizations. These policies and algorithms were followed during the patient's care in the ED.    Patient is a 44 year old recently postop from perforated diverticulitis complicated by abscesses with JP drains who comes in with recurrent fevers and worsening pain.  Will get repeat CT scan to evaluate for worsening abscess, recurrent perforation, SBO, ileus.  Patient's vital signs patient was tachycardic with increased respiratory rate.  Sepsis alert was called.  Broad-spectrum antibiotics were started to cover for most likely abdominal pathology.  Fluid resuscitation was begun.  White count significantly elevated consistent with my concern for infection.  However lactate was normal.  CT imaging was overall reassuring although there was some mild inflammation along the proximal sigmoid colon.  Patient already received antibiotics.  Discussed case with Dr. Dahlia Byes who will admit pt.    ____________________________________________   FINAL CLINICAL IMPRESSION(S) / ED DIAGNOSES   Final diagnoses:  Abdominal pain, unspecified abdominal location  Sepsis, due to unspecified organism, unspecified whether acute organ dysfunction present [facility])  Diverticulitis  MEDICATIONS GIVEN DURING THIS VISIT:  Medications  sodium chloride 0.9 % bolus 1,000 mL (has no administration in time range)    And  sodium chloride 0.9 % bolus 1,000 mL (1,000 mLs Intravenous New Bag/Given 09/16/19 1700)    And  sodium chloride 0.9 % bolus 250 mL (250 mLs Intravenous New Bag/Given 09/16/19 1700)  HYDROmorphone (DILAUDID) injection 1 mg (has no administration in time range)  ceFEPIme (MAXIPIME) 2 g in sodium chloride 0.9 % 100 mL IVPB (2 g Intravenous New Bag/Given 09/16/19 1707)  metroNIDAZOLE  (FLAGYL) IVPB 500 mg (500 mg Intravenous New Bag/Given 09/16/19 1702)  HYDROmorphone (DILAUDID) injection 1 mg (1 mg Intravenous Given 09/16/19 1654)  ondansetron (ZOFRAN) injection 4 mg (4 mg Intravenous Given 09/16/19 1654)  iohexol (OMNIPAQUE) 300 MG/ML solution 100 mL (100 mLs Intravenous Contrast Given 09/16/19 1721)     ED Discharge Orders    None       Note:  This document was prepared using Dragon voice recognition software and may include unintentional dictation errors.   Concha Se, MD 09/16/19 1806

## 2019-09-16 NOTE — ED Triage Notes (Signed)
First Nurse Note:  Arrives with C/O abdominal pain and fever.  Patient recently had bowel perforation and 2 JP drains placed.  1 drain removed today at physician office.  States fever 101.2.  Nothing taken for fever PTA.

## 2019-09-16 NOTE — H&P (Signed)
Patient ID: Sara Chapman, female   DOB: 1975/08/29, 44 y.o.   MRN: [id number]  HPI Sara Chapman is a 44 y.o. female seen in the emergency room she does have a history of recent diverticulitis with abscess requiring 2 percutaneous catheters on 2/9.  She came to the office today and Dr. Christian Mate saw her on removal of the 1 superficial drain.  He then shortly after developed increasing abdominal pain and fever.  She reports that the pain is moderate to severe intensity diffuse.  Worsening with certain movements.  She does report some decreased appetite.  No emesis.  She did have a white count of 24,000 with increasing platelets.  Stable hemoglobin. She did have a CT scan of the abdomen and pelvis that I have personally reviewed and compared to the previous images.  There is a small abscess within the mesentery.  Measures 2 cm x 1.6 cm mild diverticulitis.  has decreasing in size.  There is no free air.  HPI  Past Medical History:  Diagnosis Date  . Bowel perforation ([place])   . Psoriatic arthritis ([place])   . Stomach ulcer     History reviewed. No pertinent surgical history.  No pertinent family history  Social History Social History   Tobacco Use  . Smoking status: Never Smoker  . Smokeless tobacco: Never Used  Substance Use Topics  . Alcohol use: Never  . Drug use: Never    Allergies  Allergen Reactions  . Clonazepam Other (See Comments)    SUICIDAL THOUGHTS SUICIDAL THOUGHTS SUICIDAL THOUGHTS   . Ibuprofen Other (See Comments)    stomach ulcers stomach ulcers stomach ulcers stomach ulcers   . Nsaids Other (See Comments)    GI ulcers  . Pseudoephedrine Other (See Comments)    palpitations palpitations   . Tolmetin Other (See Comments)    GI ulcers GI ulcers   . Pseudoephedrine Hcl Other (See Comments) and Palpitations    Unable to tolerate Unable to tolerate   . Tape Rash    Paper tape breaks down the skin and causes sores    Current  Facility-Administered Medications  Medication Dose Route Frequency Provider Last Rate Last Admin  . HYDROmorphone (DILAUDID) injection 1 mg  1 mg Intravenous Once PRN Vanessa Enlow, MD      . sodium chloride 0.9 % bolus 1,000 mL  1,000 mL Intravenous Once Vanessa [place], MD       Current Outpatient Medications  Medication Sig Dispense Refill  . amoxicillin-clavulanate (AUGMENTIN) 875-125 MG tablet Take 1 tablet by mouth 2 (two) times daily for 7 days. 14 tablet 0  . COSENTYX SENSOREADY, 300 MG, 150 MG/ML SOAJ Inject 300 mg as directed once a week.    Marland Kitchen HUMIRA PEN 40 MG/0.8ML PNKT Inject 1 Syringe into the skin every 14 (fourteen) days.    Marland Kitchen omeprazole (PRILOSEC) 40 MG capsule Take 40 mg by mouth 2 (two) times daily.    . ondansetron (ZOFRAN-ODT) 4 MG disintegrating tablet Take 1 tablet (4 mg total) by mouth every 6 (six) hours as needed for nausea. 20 tablet 1  . oxyCODONE (OXY IR/ROXICODONE) 5 MG immediate release tablet Take 1 tablet (5 mg total) by mouth every 6 (six) hours as needed for severe pain or breakthrough pain. 25 tablet 0  . topiramate (TOPAMAX) 100 MG tablet Take 100 mg by mouth 2 (two) times daily.       Review of Systems Full ROS  was asked and was negative except  for the information on the HPI  Physical Exam Blood pressure (!) 145/98, pulse (!) 116, temperature 99.4 F (37.4 C), resp. rate (!) 22, height 4\' 10"  (1.473 m), weight 68.9 kg, last menstrual period 08/30/2019, SpO2 99 %. CONSTITUTIONAL: NAD EYES: Pupils are equal, round, and reactive to light, Sclera are non-icteric. EARS, NOSE, MOUTH AND THROAT: The oropharynx is clear. The oral mucosa is pink and moist. Hearing is intact to voice. LYMPH NODES:  Lymph nodes in the neck are normal. RESPIRATORY:  Lungs are clear. There is normal respiratory effort, with equal breath sounds bilaterally, and without pathologic use of accessory muscles. CARDIOVASCULAR: Heart is regular without murmurs, gallops, or rubs. GI: The  abdomen is  soft diffusely tender to palpation.  No overt peritonitis.  There is evidence of a percutaneous drain with purulent fluid.. There are no palpable masses. There is no hepatosplenomegaly. There are normal bowel sounds in all quadrants. GU: Rectal deferred.   MUSCULOSKELETAL: Normal muscle strength and tone. No cyanosis or edema.   SKIN: Turgor is good and there are no pathologic skin lesions or ulcers. NEUROLOGIC: Motor and sensation is grossly normal. Cranial nerves are grossly intact. PSYCH:  Oriented to person, place and time. Affect is normal.  Data Reviewed  I have personally reviewed the patient's imaging, laboratory findings and medical records.    Assessment/Plan 44 year old female with reticulitis and abscess with new fever potentially attributed to removal of recent drain today.  Currently there is no evidence of peritonitis or alarming signs that will prompt 54 to do an operation tonight.  Will admit start broad-spectrum antibiotics with Zosyn IV fluids and we will perform serial abdominal exams.  We will continue to observe and he may be beneficial to repeat the CT scan in 48 to 72 hours to make sure that the collection within the abdominal wall is decreasing in size.  She understands that she might need another drain if her condition does not improve.  She also understands that if her condition does not improve with drains and antibiotics she may even require an operation but we are not there yet.  [place], MD FACS General Surgeon 09/16/2019, 6:12 PM

## 2019-09-16 NOTE — ED Triage Notes (Signed)
Pt to the er for diffuse abd pain. Pt is 1 week post op diverticulitis and bowel perf, pt had 2 JP  drains placed and d/c on Friday. Pt saw surgeon this am who was not concerned with pain but pt states pain now is worse and has a fever. Pt had one JP drain  Removed this am. Pt reports pain is worse with movement.

## 2019-09-17 DIAGNOSIS — K5720 Diverticulitis of large intestine with perforation and abscess without bleeding: Principal | ICD-10-CM

## 2019-09-17 LAB — BASIC METABOLIC PANEL
Anion gap: 9 (ref 5–15)
BUN: 8 mg/dL (ref 6–20)
CO2: 20 mmol/L — ABNORMAL LOW (ref 22–32)
Calcium: 7.8 mg/dL — ABNORMAL LOW (ref 8.9–10.3)
Chloride: 110 mmol/L (ref 98–111)
Creatinine, Ser: 1.13 mg/dL — ABNORMAL HIGH (ref 0.44–1.00)
GFR calc Af Amer: >60 mL/min (ref >60)
GFR calc non Af Amer: 59 mL/min — ABNORMAL LOW (ref >60)
Glucose, Bld: 95 mg/dL (ref 70–99)
Potassium: 3.3 mmol/L — ABNORMAL LOW (ref 3.5–5.1)
Sodium: 139 mmol/L (ref 135–145)

## 2019-09-17 LAB — CBC
HCT: 34.4 % — ABNORMAL LOW (ref 36.0–46.0)
Hemoglobin: 11.5 g/dL — ABNORMAL LOW (ref 12.0–15.0)
MCH: 31.7 pg (ref 26.0–34.0)
MCHC: 33.4 g/dL (ref 30.0–36.0)
MCV: 94.8 fL (ref 80.0–100.0)
Platelets: 384 10*3/uL (ref 150–400)
RBC: 3.63 MIL/uL — ABNORMAL LOW (ref 3.87–5.11)
RDW: 12.9 % (ref 11.5–15.5)
WBC: 14.4 10*3/uL — ABNORMAL HIGH (ref 4.0–10.5)
nRBC: 0 % (ref 0.0–0.2)

## 2019-09-17 LAB — PROTIME-INR
INR: 1.2 (ref 0.8–1.2)
Prothrombin Time: 15.2 seconds (ref 11.4–15.2)

## 2019-09-17 LAB — HIV ANTIBODY (ROUTINE TESTING W REFLEX): HIV Screen 4th Generation wRfx: NONREACTIVE

## 2019-09-17 LAB — APTT: aPTT: 44 seconds — ABNORMAL HIGH (ref 24–36)

## 2019-09-17 MED ORDER — BOOST / RESOURCE BREEZE PO LIQD CUSTOM
1.0000 | Freq: Three times a day (TID) | ORAL | Status: DC
  Administered 2019-09-17 (×2): 1 via ORAL

## 2019-09-17 NOTE — Progress Notes (Signed)
[facility] SURGICAL PROGRESS NOTE (cpt [phone])  Hospital Day(s): 1.   Interval History: Patient seen and examined, no acute events or new complaints overnight. Patient reports she still has some abdominal pain, localized centrally, but this is improved compared to presentation. No further fever. No nausea or emesis. She has tolerated CLD. Leukocytosis significantly improved to 14K this morning. Renal function appears baseline. No new complaints.   Review of Systems:  Constitutional: denies fever, chills  HEENT: denies cough or congestion  Respiratory: denies any shortness of breath  Cardiovascular: denies chest pain or palpitations  Gastrointestinal: + abdominal pain, denied N/V, or diarrhea/and bowel function as per interval history Genitourinary: denies burning with urination or urinary frequency   Vital signs in last 24 hours: [min-max] current  Temp:  [95.2 F (35.1 C)-99.4 F (37.4 C)] 97.7 F (36.5 C) (02/17 0340) Pulse Rate:  [91-116] 91 (02/17 0340) Resp:  [12-22] 15 (02/16 2200) BP: (110-145)/(79-98) 110/81 (02/17 0340) SpO2:  [93 %-99 %] 99 % (02/17 0340) Weight:  [68.9 kg-69 kg] 68.9 kg (02/16 1545)     Height: 4\' 10"  (147.3 cm) Weight: 68.9 kg BMI (Calculated): 31.78   Intake/Output last 2 shifts:  02/16 0701 - 02/17 0700 In: 350 [IV Piggyback:350] Out: -    Physical Exam:  Constitutional: alert, cooperative and no distress  HENT: normocephalic without obvious abnormality  Eyes: PERRL, EOM's grossly intact and symmetric  Respiratory: breathing non-labored at rest  Cardiovascular: regular rate and sinus rhythm  Gastrointestinal: soft, tenderness centrally around drain site, and non-distended. No rebound/guarding. Remaining percutaneous drain does appear to have seropurulent output  Musculoskeletal: no edema or wounds, motor and sensation grossly intact, NT    Labs:  CBC Latest Ref Rng & Units 09/16/2019 09/16/2019 09/11/2019  WBC 4.0 - 10.5 K/uL  24.9(H) 14.2(H) 6.4  Hemoglobin 12.0 - 15.0 g/dL 11/09/2019 84.6 11.8(L)  Hematocrit 36.0 - 46.0 % 40.8 42.0 35.7(L)  Platelets 150 - 400 K/uL 478(H) 464(H) 367   CMP Latest Ref Rng & Units 09/17/2019 09/16/2019 09/11/2019  Glucose 70 - 99 mg/dL 95 11/09/2019) 952(W)  BUN 6 - 20 mg/dL 8 11 413(K)  Creatinine 0.44 - 1.00 mg/dL <4(M) 0.10(U 7.25)  Sodium 135 - 145 mmol/L 139 138 142  Potassium 3.5 - 5.1 mmol/L 3.3(L) 3.9 4.0  Chloride 98 - 111 mmol/L 110 105 113(H)  CO2 22 - 32 mmol/L 20(L) 21(L) 21(L)  Calcium 8.9 - 10.3 mg/dL 7.8(L) 9.2 8.3(L)  Total Protein 6.5 - 8.1 g/dL - 7.9 -  Total Bilirubin 0.3 - 1.2 mg/dL - 1.0 -  Alkaline Phos 38 - 126 U/L - 136(H) -  AST 15 - 41 U/L - 31 -  ALT 0 - 44 U/L - 26 -     Imaging studies: No new pertinent imaging studies   Assessment/Plan: (ICD-10's: K7.20) 44 y.o. female with improving leukocytosis still with persistent abdominal pain but no fevers readmitted with known perforated sigmoid diverticulitis with abscess s/p percutaneous drainage   - No emergent surgical indication. We will again attempt conservative management. Will likely need repeat imaging in next 72 hours or so pending clinically condition to reassess. She understands that in the interim should she clinically deteriorate then she would likely need surgical intervention (ex: Hartman's).   - Okay to continue CLD    - IVF resuscitation  - IV Abx (Zosyn)   - monitor abdominal examination  - Continue current intra-abdominal drain; monitor and record output  - Monitor leukocytosis; morning labs   -  Medical management of comorbidities  - DVT prophylaxis    All of the above findings and recommendations were discussed with the patient, and the medical team, and all of patient's questions were answered to her expressed satisfaction.  -- Edison Simon, PA-C [facility] 09/17/2019, 8:00 AM [phone] M-F: 7am - 4pm

## 2019-09-17 NOTE — Progress Notes (Signed)
Initial Nutrition Assessment  DOCUMENTATION CODES:   Not applicable  INTERVENTION:   Boost Breeze po TID, each supplement provides 250 kcal and 9 grams of protein  Pt likely at moderate refeed risk  NUTRITION DIAGNOSIS:   Inadequate oral intake related to acute illness as evidenced by per patient/family report.  GOAL:   Patient will meet greater than or equal to 90% of their needs  MONITOR:   PO intake, Supplement acceptance, Labs, Weight trends, Skin, I & O's  REASON FOR ASSESSMENT:   Malnutrition Screening Tool    ASSESSMENT:   44 y.o. female with h/o PUD and psoriatic arthritis who is admitted with perforated sigmoid diverticulitis with abscess s/p percutaneous drainage 2/9   Met with pt in room today. Pt is known to this RD from her recent previous admit. Pt reports fair appetite and oral intake in hospital; pt documented to have eaten 100% of breakfast today. Pt reports that she was eating ok home after her last discharge but her appetite has decreased. Per chart, pt has lost 8lbs(5%) in < 1 month which is significant. RD will add supplements to help pt meet her estimated needs. Suspect pt is at refeed risk.   Medications reviewed and include: heparin, protonic, NaCl _0 /hr, zosyn   Labs reviewed: K 3.3(L), creat 1.13(H) Wbc- 14.4(H)  NUTRITION - FOCUSED PHYSICAL EXAM:    Most Recent Value  Orbital Region  No depletion  Upper Arm Region  No depletion  Thoracic and Lumbar Region  No depletion  Buccal Region  No depletion  Temple Region  No depletion  Clavicle Bone Region  No depletion  Clavicle and Acromion Bone Region  No depletion  Scapular Bone Region  No depletion  Dorsal Hand  No depletion  Patellar Region  Mild depletion  Anterior Thigh Region  Mild depletion  Posterior Calf Region  Mild depletion  Edema (RD Assessment)  None  Hair  Reviewed  Eyes  Reviewed  Mouth  Reviewed  Skin  Reviewed  Nails  Reviewed     Diet Order:   Diet Order          Diet clear liquid Room service appropriate? Yes  Diet effective now             EDUCATION NEEDS:   Education needs have been addressed  Skin:  Skin Assessment: Reviewed RN Assessment  Last BM:  2/16  Height:   Ht Readings from Last 1 Encounters:  09/16/19 _1  (1.473 m)    Weight:   Wt Readings from Last 1 Encounters:  09/16/19 68.9 kg    Ideal Body Weight:  44.5 kg  BMI:  Body mass index is 31.77 kg/m.  Estimated Nutritional Needs:   Kcal:  1500-1700kcal/day  Protein:  75-85g/day  Fluid:  >1.4L/day  Koleen Distance MS, RD, LDN Contact information available in Amion

## 2019-09-18 LAB — BASIC METABOLIC PANEL
Anion gap: 3 — ABNORMAL LOW (ref 5–15)
BUN: 6 mg/dL (ref 6–20)
CO2: 21 mmol/L — ABNORMAL LOW (ref 22–32)
Calcium: 7.5 mg/dL — ABNORMAL LOW (ref 8.9–10.3)
Chloride: 116 mmol/L — ABNORMAL HIGH (ref 98–111)
Creatinine, Ser: 1.28 mg/dL — ABNORMAL HIGH (ref 0.44–1.00)
GFR calc Af Amer: 58 mL/min — ABNORMAL LOW (ref >60)
GFR calc non Af Amer: 50 mL/min — ABNORMAL LOW (ref >60)
Glucose, Bld: 85 mg/dL (ref 70–99)
Potassium: 3.5 mmol/L (ref 3.5–5.1)
Sodium: 140 mmol/L (ref 135–145)

## 2019-09-18 LAB — CBC
HCT: 29.9 % — ABNORMAL LOW (ref 36.0–46.0)
Hemoglobin: 10 g/dL — ABNORMAL LOW (ref 12.0–15.0)
MCH: 31.7 pg (ref 26.0–34.0)
MCHC: 33.4 g/dL (ref 30.0–36.0)
MCV: 94.9 fL (ref 80.0–100.0)
Platelets: 329 10*3/uL (ref 150–400)
RBC: 3.15 MIL/uL — ABNORMAL LOW (ref 3.87–5.11)
RDW: 12.9 % (ref 11.5–15.5)
WBC: 7.9 10*3/uL (ref 4.0–10.5)
nRBC: 0 % (ref 0.0–0.2)

## 2019-09-18 MED ORDER — SODIUM CHLORIDE 0.9 % IV SOLN
INTRAVENOUS | Status: DC | PRN
  Administered 2019-09-18 – 2019-09-19 (×2): 500 mL via INTRAVENOUS

## 2019-09-18 NOTE — Progress Notes (Addendum)
[facility] SUR[facility]ICAL PROGRESS NOTE (cpt [phone])  Hospital Day(s): 2.   Interval History:  Patient seen and examined, no acute events or new complaints overnight.  Patient reports she is doing much better, and this is certainly the best ive ever seen her clinically Abdominal pain is significantly improved No nausea or emesis Leukocytosis has resolved, now 7.9K; afebrile x24 hours Slight bump in sCr to 1.28, good measured U/O   Review of Systems:  Constitutional: denies fever, chills  HEENT: denies cough or congestion  Respiratory: denies any shortness of breath  Cardiovascular: denies chest pain or palpitations  Gastrointestinal: denies abdominal pain, N/V, or diarrhea/and bowel function as per interval history Genitourinary: denies burning with urination or urinary frequency   Vital signs in last 24 hours: [min-max] current  Temp:  [97.7 F (36.5 C)-98.6 F (37 C)] 98.6 F (37 C) (02/18 0441) Pulse Rate:  [78-87] 87 (02/18 0441) Resp:  [17] 17 (02/18 0441) BP: (99-120)/(70-78) 120/78 (02/18 0441) SpO2:  [99 %] 99 % (02/18 0441)     Height: 4\' 10"  (147.3 cm) Weight: 68.9 kg BMI (Calculated): 31.78   Intake/Output last 2 shifts:  02/17 0701 - 02/18 0700 In: 65 [P.O.:555] Out: 670 [Urine:650; Drains:20]   Physical Exam:  Constitutional: alert, cooperative and no distress, sitting up on the side of the bed  HENT: normocephalic without obvious abnormality  Eyes: PERRL, EOM's grossly intact and symmetric  Respiratory: breathing non-labored at rest  Cardiovascular: regular rate and sinus rhythm  Gastrointestinal: soft, non-tender, and non-distended. No rebound or guarding. Midline percutaneous drain with seropurulent drainage Musculoskeletal: no edema or wounds, motor and sensation grossly intact, NT    Labs:  CBC Latest Ref Rng & Units 09/18/2019 09/17/2019 09/16/2019  WBC 4.0 - 10.5 K/uL 7.9 14.4(H) 24.9(H)  Hemoglobin 12.0 - 15.0 g/dL 10.0(L) 11.5(L) 13.9   Hematocrit 36.0 - 46.0 % 29.9(L) 34.4(L) 40.8  Platelets 150 - 400 K/uL 329 384 478(H)   CMP Latest Ref Rng & Units 09/18/2019 09/17/2019 09/16/2019  Glucose 70 - 99 mg/dL 85 95 117(H)  BUN 6 - 20 mg/dL 6 8 11   Creatinine 0.44 - 1.00 mg/dL 1.28(H) 1.13(H) 0.99  Sodium 135 - 145 mmol/L 140 139 138  Potassium 3.5 - 5.1 mmol/L 3.5 3.3(L) 3.9  Chloride 98 - 111 mmol/L 116(H) 110 105  CO2 22 - 32 mmol/L 21(L) 20(L) 21(L)  Calcium 8.9 - 10.3 mg/dL 7.5(L) 7.8(L) 9.2  Total Protein 6.5 - 8.1 g/dL - - 7.9  Total Bilirubin 0.3 - 1.2 mg/dL - - 1.0  Alkaline Phos 38 - 126 U/L - - 136(H)  AST 15 - 41 U/L - - 31  ALT 0 - 44 U/L - - 26     Imaging studies: No new pertinent imaging studies   Assessment/Plan: (ICD-10's: K78.20) 44 y.o. female with resolved leukocytosis and significant clinical improvement this morning with known perforated sigmoid diverticulitis with abscess s/p percutaneous drainage   - We will plan on repeating CT scan in the AM to reassess  - Advance diet this morning   - Discontinue IVF  - Continue IV ABx (Zosyn)   - monitor abdominal examination             - Continue current intra-abdominal drain; monitor and record output   - Medical management of comorbidities  - DVT prophylaxis    All of the above findings and recommendations were discussed with the patient, and the medical team, and all of patient's questions were answered  to her expressed satisfaction.  -- Lynden Oxford, PA-C [facility] 09/18/2019, 7:52 AM [phone] M-F: 7am - 4pm

## 2019-09-19 ENCOUNTER — Ambulatory Visit: Admission: RE | Admit: 2019-09-19 | Payer: BC Managed Care – PPO | Source: Ambulatory Visit

## 2019-09-19 ENCOUNTER — Inpatient Hospital Stay: Payer: BC Managed Care – PPO

## 2019-09-19 ENCOUNTER — Encounter: Payer: Self-pay | Admitting: Surgery

## 2019-09-19 MED ORDER — AMOXICILLIN-POT CLAVULANATE 875-125 MG PO TABS: 1.0000 | ORAL_TABLET | Freq: Two times a day (BID) | ORAL | 0 refills | Status: AC

## 2019-09-19 MED ORDER — IOHEXOL 9 MG/ML PO SOLN
500.0000 mL | ORAL | Status: AC
  Administered 2019-09-19 (×2): 500 mL via ORAL

## 2019-09-19 MED ORDER — IOHEXOL 300 MG/ML  SOLN
100.0000 mL | Freq: Once | INTRAMUSCULAR | Status: AC | PRN
  Administered 2019-09-19: 100 mL via INTRAVENOUS

## 2019-09-19 NOTE — Progress Notes (Signed)
09/19/2019 2:29 PM  Kenney Houseman Frederico Hamman to be D/C'd Home per MD order.  Discussed prescriptions and follow up appointments with the patient. Prescriptions given to patient, medication list explained in detail. Pt verbalized understanding.  Allergies as of 09/19/2019      Reactions   Clonazepam Other (See Comments)   SUICIDAL THOUGHTS SUICIDAL THOUGHTS SUICIDAL THOUGHTS   Ibuprofen Other (See Comments)   stomach ulcers stomach ulcers stomach ulcers stomach ulcers   Nsaids Other (See Comments)   GI ulcers   Pseudoephedrine Other (See Comments)   palpitations palpitations   Tolmetin Other (See Comments)   GI ulcers GI ulcers   Pseudoephedrine Hcl Other (See Comments), Palpitations   Unable to tolerate Unable to tolerate   Tape Rash   Paper tape breaks down the skin and causes sores      Medication List    TAKE these medications   amoxicillin-clavulanate 875-125 MG tablet Commonly known as: Augmentin Take 1 tablet by mouth 2 (two) times daily for 7 days. Notes to patient: Before bed 09/19/19   Cosentyx Sensoready (300 MG) 150 MG/ML Soaj Generic drug: Secukinumab (300 MG Dose) Inject 300 mg as directed once a week. Notes to patient: According to established schedule   Humira Pen 40 MG/0.8ML Pnkt Generic drug: Adalimumab Inject 1 Syringe into the skin every 14 (fourteen) days. Notes to patient: According to established schedule   omeprazole 40 MG capsule Commonly known as: PRILOSEC Take 40 mg by mouth 2 (two) times daily. Notes to patient: Before bed 09/19/19   ondansetron 4 MG disintegrating tablet Commonly known as: ZOFRAN-ODT Take 1 tablet (4 mg total) by mouth every 6 (six) hours as needed for nausea. Notes to patient: As needed   oxyCODONE 5 MG immediate release tablet Commonly known as: Oxy IR/ROXICODONE Take 1 tablet (5 mg total) by mouth every 6 (six) hours as needed for severe pain or breakthrough pain. Notes to patient: As needed   topiramate 100 MG  tablet Commonly known as: TOPAMAX Take 100 mg by mouth 2 (two) times daily. Notes to patient: Before bed 09/19/19       Vitals:   09/19/19 0512 09/19/19 1208  BP: 130/80 (!) 146/92  Pulse: 80 74  Resp: 20 16  Temp: 98.1 F (36.7 C) 97.9 F (36.6 C)  SpO2: 96% 100%    Skin clean, dry and intact without evidence of skin break down, no evidence of skin tears noted. IV catheter discontinued intact. Site without signs and symptoms of complications. Dressing and pressure applied. Pt denies pain at this time. No complaints noted.  An After Visit Summary was printed and given to the patient. Patient escorted via WC, and D/C home via private auto.  Bradly Chris

## 2019-09-19 NOTE — Discharge Summary (Signed)
[facility] SUR[facility]ICAL DISCHARGE SUMMARY (cpt: [id number])  Patient ID: Sara Chapman MRN: [id number] DOB/AGE: Apr 05, 1976 44 y.o.  Admit date: 09/16/2019 Discharge date: 09/19/2019  Discharge Diagnoses Patient Active Problem List   Diagnosis Date Noted  . Diverticulitis of large intestine with perforation and abscess 09/03/2019    Consultants None  Procedures None  HPI: Sara Chapman is a 44 y.o. female who is known to our practice for diverticulitis with perforation and abscess managed conservatively in the hospital. She followed up with Dr Christian Mate on 02/16 and one of her two percutaneous drains were removed. Following this she noticed worsening abdominal pain and fever at home which prompted presentation to the ED. At that time, she was found to have a leukocytosis to 24K but repeat CT scan was relatively stable compared to previous admission. She was admitted to general surgery.  Hospital Course: Over the course of the first two hospital days her pain gradually improved to the point of resolution. Her leukocytosis resolved in 48 hours with IV Abx. Her remaining drain continued to have seropurulent output throughout the admission. Given the readmission we did re-scan her on 02/19 which showed interval improvement compared 02/16 and clinically she was doing well. Advancement of patient's diet and ambulation were well-tolerated. The remainder of patient's hospital course was essentially unremarkable, and discharge planning was initiated accordingly with patient safely able to be discharged home with appropriate discharge instructions, antibiotics (Augmentin x7 days), pain control, and outpatient follow-up after all of her questions were answered to her expressed satisfaction.   Discharge Condition: Good   Physical Examination:  Constitutional: Well appearing female, NAD Pulmonary: Normal effort, No respiratory distress Gastrointestinal: Soft, non-tender,  non-distended, no rebound or guarding. Remaining percutaneous drain in central abdomen with seropurulent output Skin: warm, dry    Allergies as of 09/19/2019      Reactions   Clonazepam Other (See Comments)   SUICIDAL THOUGHTS SUICIDAL THOUGHTS SUICIDAL THOUGHTS   Ibuprofen Other (See Comments)   stomach ulcers stomach ulcers stomach ulcers stomach ulcers   Nsaids Other (See Comments)   GI ulcers   Pseudoephedrine Other (See Comments)   palpitations palpitations   Tolmetin Other (See Comments)   GI ulcers GI ulcers   Pseudoephedrine Hcl Other (See Comments), Palpitations   Unable to tolerate Unable to tolerate   Tape Rash   Paper tape breaks down the skin and causes sores      Medication List    TAKE these medications   amoxicillin-clavulanate 875-125 MG tablet Commonly known as: Augmentin Take 1 tablet by mouth 2 (two) times daily for 7 days.   Cosentyx Sensoready (300 MG) 150 MG/ML Soaj Generic drug: Secukinumab (300 MG Dose) Inject 300 mg as directed once a week.   Humira Pen 40 MG/0.8ML Pnkt Generic drug: Adalimumab Inject 1 Syringe into the skin every 14 (fourteen) days.   omeprazole 40 MG capsule Commonly known as: PRILOSEC Take 40 mg by mouth 2 (two) times daily.   ondansetron 4 MG disintegrating tablet Commonly known as: ZOFRAN-ODT Take 1 tablet (4 mg total) by mouth every 6 (six) hours as needed for nausea.   oxyCODONE 5 MG immediate release tablet Commonly known as: Oxy IR/ROXICODONE Take 1 tablet (5 mg total) by mouth every 6 (six) hours as needed for severe pain or breakthrough pain.   topiramate 100 MG tablet Commonly known as: TOPAMAX Take 100 mg by mouth 2 (two) times daily.        Follow-up Information  Campbell Lerner, MD. Nyra Capes on 09/23/2019.   Specialty: Surgery Why: Keep appointment with Dr Claudine Mouton on 02/23 Contact information: [address] [place] [address] [phone]            Time spent on  discharge management including discussion of hospital course, clinical condition, outpatient instructions, prescriptions, and follow up with the patient and members of the medical team: >30 minutes  -- Lynden Oxford , PA-C [facility]  09/19/2019, 12:14 PM [phone] M-F: 7am - 4pm

## 2019-09-21 LAB — CULTURE, BLOOD (ROUTINE X 2)
Culture: NO GROWTH
Culture: NO GROWTH
Special Requests: ADEQUATE
Special Requests: ADEQUATE

## 2019-09-22 ENCOUNTER — Telehealth: Payer: Self-pay | Admitting: *Deleted

## 2019-09-22 NOTE — Telephone Encounter (Signed)
Per discharge instructions she is to keep her appointment for Tuesday. Patient notified and in agreement.

## 2019-09-22 NOTE — Telephone Encounter (Signed)
Patient called and stated that she was just discharged on Friday 09/19/19 by Dr. Everlene Farrier and patient stated that she has an appointment with Dr Claudine Mouton on 09/23/19 Tomorrow, but she asked Dr Everlene Farrier if she needs to push this appointment back or should she still come in tomorrow. Please call and advise

## 2019-09-23 ENCOUNTER — Ambulatory Visit (INDEPENDENT_AMBULATORY_CARE_PROVIDER_SITE_OTHER): Payer: BC Managed Care – PPO | Admitting: Surgery

## 2019-09-23 ENCOUNTER — Other Ambulatory Visit: Payer: Self-pay

## 2019-09-23 VITALS — BP 134/99 | HR 89 | Temp 97.5°F | Resp 12 | Ht 59.0 in | Wt 151.0 lb

## 2019-09-23 DIAGNOSIS — R109 Unspecified abdominal pain: Secondary | ICD-10-CM

## 2019-09-23 DIAGNOSIS — K5720 Diverticulitis of large intestine with perforation and abscess without bleeding: Secondary | ICD-10-CM | POA: Diagnosis not present

## 2019-09-23 NOTE — Progress Notes (Signed)
Surgical Clinic Progress/Follow-up Note   HPI:  44 y.o. Female presents to clinic for diverticular abscess drainage follow-up 5 days follow the last evaluation. Patient reports improvement/resolution of prior issues and has been tolerating regular diet with +flatus and BM's, denies N/V, fever/chills, CP, or SOB.  Review of Systems:  Constitutional: denies fever/chills  ENT: denies sore throat, hearing problems  Respiratory: denies shortness of breath, wheezing  Cardiovascular: denies chest pain, palpitations  Gastrointestinal: denies abdominal pain, N/V, or diarrhea/and bowel function as per interval history Skin: Denies any other rashes or skin discolorations   Vital Signs:  BP (!) 134/99   Pulse 89   Temp (!) 97.5 F (36.4 C)   Resp 12   Ht 4\' 11"  (1.499 m)   Wt 151 lb (68.5 kg)   LMP 08/30/2019 Comment: neg preg test  SpO2 98%   BMI 30.50 kg/m    Physical Exam:  Constitutional:  -- Normal body habitus  -- Awake, alert, and oriented x3  Pulmonary:  -- No crackles -- Equal breath sounds bilaterally -- Breathing non-labored at rest Cardiovascular:  -- S1, S2 present  -- No pericardial rubs  Gastrointestinal:  -- Soft and non-distended, non-tender/with mild peri-drain tenderness to palpation, no guarding/rebound tenderness, drain output appears to be more serous than purulent. -- -- No abdominal masses appreciated, pulsatile or otherwise    Musculoskeletal / Integumentary:  -- Wounds or skin discoloration: none appreciated  -- Extremities: B/L UE and LE FROM, hands and feet warm, no edema   Laboratory studies: Nothing new since last hospitalization.  Imaging: No new pertinent imaging available for review   Assessment:  44 y.o. yo Female with a problem list including...  Patient Active Problem List   Diagnosis Date Noted  . Diverticulitis large intestine 09/16/2019  . Diverticulitis of large intestine with perforation and abscess 09/03/2019    presents to clinic  for follow-up evaluation of diverticular abscess drain, progressing well.  Plan:              - return to clinic Tuesday 1 week or as needed, instructed to call office if any questions or concerns Plan for follow-up CT scan for Monday.  Continue Augmentin through next visit.  Bring pain medications/fever reducer and Cipro/fat Flagyl to the next visit as well.  Anticipating possible drain removal 1 week from today. All of the above recommendations were discussed with the patient and patient's family, and all of patient's and family's questions were answered to his/her/their expressed satisfaction.  Saturday, MD, FACS [place]: [facility] General [facility] - Partnering for exceptional care. Office: [phone]

## 2019-09-23 NOTE — Patient Instructions (Addendum)
Dr.Rodenberg recommended patient to get a follow up CT Scan. Patient was advised to continue with her course of treatment.   Patient is scheduled for a repeat CT Abdomen Pelvis w. Contrast on Monday 09/29/19 with arrival time at 10:45am for scan at 11:00am at [facility]. Patient is to pick up prep kit with instructions prior to procedure.

## 2019-09-29 ENCOUNTER — Ambulatory Visit
Admission: RE | Admit: 2019-09-29 | Discharge: 2019-09-29 | Disposition: A | Discharge status: Home / Self Care | Payer: BC Managed Care – PPO | Source: Ambulatory Visit | Attending: Surgery | Admitting: Surgery

## 2019-09-29 ENCOUNTER — Other Ambulatory Visit: Payer: Self-pay

## 2019-09-29 DIAGNOSIS — R109 Unspecified abdominal pain: Secondary | ICD-10-CM | POA: Diagnosis not present

## 2019-09-29 DIAGNOSIS — K5720 Diverticulitis of large intestine with perforation and abscess without bleeding: Secondary | ICD-10-CM | POA: Insufficient documentation

## 2019-09-29 MED ORDER — IOHEXOL 300 MG/ML  SOLN
100.0000 mL | Freq: Once | INTRAMUSCULAR | Status: AC | PRN
  Administered 2019-09-29: 100 mL via INTRAVENOUS

## 2019-09-30 ENCOUNTER — Ambulatory Visit (INDEPENDENT_AMBULATORY_CARE_PROVIDER_SITE_OTHER): Payer: BC Managed Care – PPO | Admitting: Surgery

## 2019-09-30 ENCOUNTER — Encounter: Payer: Self-pay | Admitting: Surgery

## 2019-09-30 VITALS — BP 172/115 | HR 86 | Temp 97.5°F | Resp 12 | Ht 59.0 in | Wt 152.6 lb

## 2019-09-30 DIAGNOSIS — K5720 Diverticulitis of large intestine with perforation and abscess without bleeding: Secondary | ICD-10-CM | POA: Diagnosis not present

## 2019-09-30 NOTE — Progress Notes (Signed)
[facility] POST-OP OFFICE VISIT  09/30/2019  HPI: Sara Chapman is a 44 y.o. female  s/p hospitalization for fever after first of 2 diverticular abscess drain removals.  With IV antibiotic she had no additional fevers in the hospital, her white blood cell count normalized rather quickly.  We are able to transition her back to oral antibiotics and she was discharged home with a drain.  Drain output remains minimal, CT scan imaging from yesterday reviewed confirming there is no residual fluid collection.  Trace of residual diverticulitis present.  Her husband reports that the last saline flush came all back around the drain, confirming to me that the tract has formed around the drain sufficiently.  This was done before yesterday CT scan was completed. Apparently she did not tolerate the oral contrast well from yesterday as she reports vomiting.  Denies fevers or chills.  The drain content appears the most serous its ever looked, and this is very minimal from last night. Vital signs: BP (!) 172/115   Pulse 86   Temp (!) 97.5 F (36.4 C) (Temporal)   Resp 12   Ht 4\' 11"  (1.499 m)   Wt 152 lb 9.6 oz (69.2 kg)   SpO2 97%   BMI 30.82 kg/m    Physical Exam: Constitutional: Appears well, nontoxic. Abdomen: Soft nontender with midline drain present, I removed the drain without difficulty.  Applied dry dressing. Skin: No evidence of rash, erythema or induration.  Assessment/Plan: This is a 44 y.o. female s/p drain removal treatment for diverticular abscess.  Patient Active Problem List   Diagnosis Date Noted  . Diverticulitis large intestine 09/16/2019  . Diverticulitis of large intestine with perforation and abscess 09/03/2019    -Advised to continue Cipro Flagyl until current supply is completed.  This should be a complete course.  Advised to utilize Tylenol and Advil proactively to pretreat any potential fever.  We will anticipate good things and schedule her for  follow-up in 3 weeks.  She is well aware she may follow-up with 11/01/2019 as needed should she have any turn in her symptoms.  I would like to keep this lady out of the emergency room.   [place] M.D., FACS 09/30/2019, 12:05 PM

## 2019-09-30 NOTE — Patient Instructions (Addendum)
Dr.Rodenberg will remove patient's drain at today's visit. He recommends patient to take Tylenol or Ibuprofen to prevent fever(s) or pain management. Patient also advised to continue with the course antibiotic treatment of Cipro and Flagyl. Dr.Rodenberg placed a dry gauze dressing over the wound at today's visit. Patient may removed the dressing when she shower(s) and then apply a band-aid to the area after shower. Patient will follow up in three weeks, if she has any problems or concerns. Patient may contact our office.   GENERAL POST-OPERATIVE PATIENT INSTRUCTIONS   FOLLOW-UP:  Please make an appointment with your physician in.  Call your physician immediately if you have any fevers greater than 102.5, drainage from you wound that is not clear or looks infected, persistent bleeding, increasing abdominal pain, problems urinating, or persistent nausea/vomiting.    WOUND CARE INSTRUCTIONS:  Keep a dry clean dressing on the wound if there is drainage. The initial bandage may be removed after 24 hours.  Once the wound has quit draining you may leave it open to air.  If clothing rubs against the wound or causes irritation and the wound is not draining you may cover it with a dry dressing during the daytime.  Try to keep the wound dry and avoid ointments on the wound unless directed to do so.  If the wound becomes bright red and painful or starts to drain infected material that is not clear, please contact your physician immediately.  If the wound is mildly pink and has a thick firm ridge underneath it, this is normal, and is referred to as a healing ridge.  This will resolve over the next 4-6 weeks.  DIET:  You may eat any foods that you can tolerate.  It is a good idea to eat a high fiber diet and take in plenty of fluids to prevent constipation.  If you do become constipated you may want to take a mild laxative or take ducolax tablets on a daily basis until your bowel habits are regular.  Constipation can be  very uncomfortable, along with straining, after recent surgery.  ACTIVITY:  You are encouraged to cough and deep breath or use your incentive spirometer if you were given one, every 15-30 minutes when awake.  This will help prevent respiratory complications and low grade fevers post-operatively if you had a general anesthetic.  You may want to hug a pillow when coughing and sneezing to add additional support to the surgical area, if you had abdominal or chest surgery, which will decrease pain during these times.  You are encouraged to walk and engage in light activity for the next two weeks.  You should not lift more than 20 pounds during this time frame as it could put you at increased risk for complications.  Twenty pounds is roughly equivalent to a plastic bag of groceries.    MEDICATIONS:  Try to take narcotic medications and anti-inflammatory medications, such as tylenol, ibuprofen, naprosyn, etc., with food.  This will minimize stomach upset from the medication.  Should you develop nausea and vomiting from the pain medication, or develop a rash, please discontinue the medication and contact your physician.  You should not drive, make important decisions, or operate machinery when taking narcotic pain medication.  QUESTIONS:  Please feel free to call your physician or the hospital operator if you have any questions, and they will be glad to assist you.

## 2019-10-06 ENCOUNTER — Telehealth: Payer: Self-pay | Admitting: *Deleted

## 2019-10-06 NOTE — Telephone Encounter (Signed)
She states that her pain has never really gone away. She states that the pain feels like gas and bloating pain, not super bad but still there. The pain woke her up the other night and eased off after about an hour. She denies any fevers, but does feel cold at times. She still gets nauseated at times unless her stomach is almost empty. She does report urgency with her bowels. She has been eating Activa yogurt. she has been eating a bland diet. Just wants to know if these symptoms are normal after diverticulitis.    I spoke with Dr Claudine Mouton about this and he states that she should start to see improvement once she has completed the antibiotics. No concerns at this time with her symptoms. She does need to make sure she is increasing her fluid intake and is getting some fiber in her diet either through food or supplements.   She will increase her fluids and will increase her fiber intake. She is aware to call back if her symptoms worsen.

## 2019-10-06 NOTE — Telephone Encounter (Signed)
Patient called and stated that she is still experciening some stomach pain and nausea from the diverticulitis. She stated that the pain is a constant ache, and hurts worse at night. She also feels like "air" inside her stomach and is bloated. She finished the rounds of augumtien and is still taking Flagyl and cipro that the hospital prescribed her. She is more concern if this is normal symptoms or does she need to come into the office sooner than her appointment on 10/23/19 with Dr.Rodenberg  Please call and advise

## 2019-10-09 ENCOUNTER — Telehealth: Payer: Self-pay | Admitting: Surgery

## 2019-10-09 NOTE — Telephone Encounter (Signed)
Called number listed to gather more information and follow up with patient. Was unable to leave a message on the answering machine.

## 2019-10-09 NOTE — Telephone Encounter (Signed)
Patient is asking for one of the nurses to give her a call back. Please call patient and advise.

## 2019-10-10 ENCOUNTER — Telehealth: Payer: Self-pay | Admitting: Surgery

## 2019-10-10 NOTE — Telephone Encounter (Signed)
Patient is calling again said she missed a call yesterday and was returning the call. Please call patient and advise.

## 2019-10-10 NOTE — Telephone Encounter (Signed)
Called patient and no answer . Unable to leave message.

## 2019-10-14 ENCOUNTER — Telehealth: Payer: Self-pay | Admitting: *Deleted

## 2019-10-14 NOTE — Telephone Encounter (Signed)
Faxed FMLA to [phone]

## 2019-10-16 ENCOUNTER — Telehealth: Payer: Self-pay | Admitting: *Deleted

## 2019-10-16 NOTE — Telephone Encounter (Signed)
Patient called and stated her pain level is still really bad  She has been having to take her pain medication due to the pain but it makes her feel "weird" she thinks the pain is getting worse the last couple of days and not sure if can wait much longer until she has surgery.   Patient has no fever but still having nausea and vomiting   Please call and advise

## 2019-10-16 NOTE — Telephone Encounter (Signed)
Per Dr.Rodenberg states he would contact patient and speak with her.

## 2019-10-17 ENCOUNTER — Encounter: Payer: Self-pay | Admitting: Emergency Medicine

## 2019-10-17 ENCOUNTER — Other Ambulatory Visit: Payer: Self-pay

## 2019-10-17 DIAGNOSIS — N179 Acute kidney failure, unspecified: Secondary | ICD-10-CM | POA: Diagnosis not present

## 2019-10-17 DIAGNOSIS — F419 Anxiety disorder, unspecified: Secondary | ICD-10-CM | POA: Diagnosis present

## 2019-10-17 DIAGNOSIS — R519 Headache, unspecified: Secondary | ICD-10-CM | POA: Diagnosis present

## 2019-10-17 DIAGNOSIS — A411 Sepsis due to other specified staphylococcus: Principal | ICD-10-CM | POA: Diagnosis present

## 2019-10-17 DIAGNOSIS — Z6832 Body mass index (BMI) 32.0-32.9, adult: Secondary | ICD-10-CM

## 2019-10-17 DIAGNOSIS — K5780 Diverticulitis of intestine, part unspecified, with perforation and abscess without bleeding: Secondary | ICD-10-CM | POA: Diagnosis present

## 2019-10-17 DIAGNOSIS — Z888 Allergy status to other drugs, medicaments and biological substances status: Secondary | ICD-10-CM

## 2019-10-17 DIAGNOSIS — K5720 Diverticulitis of large intestine with perforation and abscess without bleeding: Secondary | ICD-10-CM | POA: Diagnosis present

## 2019-10-17 DIAGNOSIS — E669 Obesity, unspecified: Secondary | ICD-10-CM | POA: Diagnosis present

## 2019-10-17 DIAGNOSIS — K651 Peritoneal abscess: Secondary | ICD-10-CM | POA: Diagnosis not present

## 2019-10-17 DIAGNOSIS — Z886 Allergy status to analgesic agent status: Secondary | ICD-10-CM

## 2019-10-17 DIAGNOSIS — W57XXXA Bitten or stung by nonvenomous insect and other nonvenomous arthropods, initial encounter: Secondary | ICD-10-CM | POA: Diagnosis present

## 2019-10-17 DIAGNOSIS — Z20822 Contact with and (suspected) exposure to covid-19: Secondary | ICD-10-CM | POA: Diagnosis present

## 2019-10-17 DIAGNOSIS — Z8711 Personal history of peptic ulcer disease: Secondary | ICD-10-CM

## 2019-10-17 DIAGNOSIS — L4050 Arthropathic psoriasis, unspecified: Secondary | ICD-10-CM | POA: Diagnosis present

## 2019-10-17 DIAGNOSIS — E860 Dehydration: Secondary | ICD-10-CM | POA: Diagnosis not present

## 2019-10-17 DIAGNOSIS — Z79899 Other long term (current) drug therapy: Secondary | ICD-10-CM

## 2019-10-17 DIAGNOSIS — E876 Hypokalemia: Secondary | ICD-10-CM | POA: Diagnosis not present

## 2019-10-17 DIAGNOSIS — I10 Essential (primary) hypertension: Secondary | ICD-10-CM | POA: Diagnosis present

## 2019-10-17 DIAGNOSIS — R000 Tachycardia, unspecified: Secondary | ICD-10-CM | POA: Diagnosis present

## 2019-10-17 DIAGNOSIS — K5792 Diverticulitis of intestine, part unspecified, without perforation or abscess without bleeding: Secondary | ICD-10-CM | POA: Diagnosis not present

## 2019-10-17 DIAGNOSIS — Z91048 Other nonmedicinal substance allergy status: Secondary | ICD-10-CM

## 2019-10-17 DIAGNOSIS — R112 Nausea with vomiting, unspecified: Secondary | ICD-10-CM | POA: Diagnosis present

## 2019-10-17 LAB — CBC
HCT: 39.1 % (ref 36.0–46.0)
Hemoglobin: 13 g/dL (ref 12.0–15.0)
MCH: 31 pg (ref 26.0–34.0)
MCHC: 33.2 g/dL (ref 30.0–36.0)
MCV: 93.1 fL (ref 80.0–100.0)
Platelets: 278 10*3/uL (ref 150–400)
RBC: 4.2 MIL/uL (ref 3.87–5.11)
RDW: 13 % (ref 11.5–15.5)
WBC: 12 10*3/uL — ABNORMAL HIGH (ref 4.0–10.5)
nRBC: 0 % (ref 0.0–0.2)

## 2019-10-17 MED ORDER — HYDROMORPHONE HCL 1 MG/ML IJ SOLN
0.5000 mg | Freq: Once | INTRAMUSCULAR | Status: AC
  Administered 2019-10-17: 0.5 mg via INTRAVENOUS
  Filled 2019-10-17: qty 1

## 2019-10-17 MED ORDER — ONDANSETRON HCL 4 MG/2ML IJ SOLN
4.0000 mg | Freq: Once | INTRAMUSCULAR | Status: AC | PRN
  Administered 2019-10-17: 4 mg via INTRAVENOUS
  Filled 2019-10-17: qty 2

## 2019-10-17 MED ORDER — SODIUM CHLORIDE 0.9% FLUSH
3.0000 mL | Freq: Once | INTRAVENOUS | Status: AC
  Administered 2019-10-18: 3 mL via INTRAVENOUS

## 2019-10-17 NOTE — ED Triage Notes (Signed)
Pt presents to ER from home with complaints of abdominal that started tonight about 21:30. Pt reports February had perforated colon and developed an infection, pt reports is has been 3 weeks since discharge from hospital, surgeon recommended pt to  Come to ER. Pt is crying reports never experienced this pain before.  Moderate distress noted.

## 2019-10-18 ENCOUNTER — Emergency Department: Payer: BC Managed Care – PPO

## 2019-10-18 ENCOUNTER — Encounter: Payer: Self-pay | Admitting: Internal Medicine

## 2019-10-18 ENCOUNTER — Inpatient Hospital Stay
Admission: EM | Admit: 2019-10-18 | Discharge: 2019-10-27 | DRG: 871 | Disposition: A | Discharge status: Home Health | Payer: BC Managed Care – PPO | Attending: Internal Medicine | Admitting: Internal Medicine

## 2019-10-18 DIAGNOSIS — R7881 Bacteremia: Secondary | ICD-10-CM | POA: Diagnosis not present

## 2019-10-18 DIAGNOSIS — L4050 Arthropathic psoriasis, unspecified: Secondary | ICD-10-CM

## 2019-10-18 DIAGNOSIS — B957 Other staphylococcus as the cause of diseases classified elsewhere: Secondary | ICD-10-CM | POA: Diagnosis not present

## 2019-10-18 DIAGNOSIS — G8929 Other chronic pain: Secondary | ICD-10-CM

## 2019-10-18 DIAGNOSIS — K5792 Diverticulitis of intestine, part unspecified, without perforation or abscess without bleeding: Secondary | ICD-10-CM

## 2019-10-18 DIAGNOSIS — Z6832 Body mass index (BMI) 32.0-32.9, adult: Secondary | ICD-10-CM | POA: Diagnosis not present

## 2019-10-18 DIAGNOSIS — K651 Peritoneal abscess: Secondary | ICD-10-CM | POA: Diagnosis not present

## 2019-10-18 DIAGNOSIS — A411 Sepsis due to other specified staphylococcus: Secondary | ICD-10-CM | POA: Diagnosis not present

## 2019-10-18 DIAGNOSIS — A419 Sepsis, unspecified organism: Secondary | ICD-10-CM | POA: Diagnosis not present

## 2019-10-18 DIAGNOSIS — R000 Tachycardia, unspecified: Secondary | ICD-10-CM | POA: Diagnosis present

## 2019-10-18 DIAGNOSIS — K5780 Diverticulitis of intestine, part unspecified, with perforation and abscess without bleeding: Secondary | ICD-10-CM | POA: Diagnosis present

## 2019-10-18 DIAGNOSIS — Z8711 Personal history of peptic ulcer disease: Secondary | ICD-10-CM | POA: Diagnosis not present

## 2019-10-18 DIAGNOSIS — K5720 Diverticulitis of large intestine with perforation and abscess without bleeding: Secondary | ICD-10-CM

## 2019-10-18 DIAGNOSIS — N179 Acute kidney failure, unspecified: Secondary | ICD-10-CM | POA: Diagnosis not present

## 2019-10-18 DIAGNOSIS — R1084 Generalized abdominal pain: Secondary | ICD-10-CM | POA: Diagnosis not present

## 2019-10-18 DIAGNOSIS — Z978 Presence of other specified devices: Secondary | ICD-10-CM | POA: Diagnosis not present

## 2019-10-18 DIAGNOSIS — R112 Nausea with vomiting, unspecified: Secondary | ICD-10-CM

## 2019-10-18 DIAGNOSIS — Z79899 Other long term (current) drug therapy: Secondary | ICD-10-CM | POA: Diagnosis not present

## 2019-10-18 DIAGNOSIS — E876 Hypokalemia: Secondary | ICD-10-CM | POA: Diagnosis not present

## 2019-10-18 DIAGNOSIS — Z91048 Other nonmedicinal substance allergy status: Secondary | ICD-10-CM | POA: Diagnosis not present

## 2019-10-18 DIAGNOSIS — I10 Essential (primary) hypertension: Secondary | ICD-10-CM

## 2019-10-18 DIAGNOSIS — R519 Headache, unspecified: Secondary | ICD-10-CM | POA: Diagnosis not present

## 2019-10-18 DIAGNOSIS — W57XXXD Bitten or stung by nonvenomous insect and other nonvenomous arthropods, subsequent encounter: Secondary | ICD-10-CM | POA: Diagnosis not present

## 2019-10-18 DIAGNOSIS — E669 Obesity, unspecified: Secondary | ICD-10-CM | POA: Diagnosis present

## 2019-10-18 DIAGNOSIS — Z20822 Contact with and (suspected) exposure to covid-19: Secondary | ICD-10-CM | POA: Diagnosis present

## 2019-10-18 DIAGNOSIS — E860 Dehydration: Secondary | ICD-10-CM | POA: Diagnosis not present

## 2019-10-18 DIAGNOSIS — F419 Anxiety disorder, unspecified: Secondary | ICD-10-CM | POA: Diagnosis present

## 2019-10-18 DIAGNOSIS — W57XXXA Bitten or stung by nonvenomous insect and other nonvenomous arthropods, initial encounter: Secondary | ICD-10-CM | POA: Diagnosis present

## 2019-10-18 DIAGNOSIS — Z888 Allergy status to other drugs, medicaments and biological substances status: Secondary | ICD-10-CM | POA: Diagnosis not present

## 2019-10-18 DIAGNOSIS — Z886 Allergy status to analgesic agent status: Secondary | ICD-10-CM | POA: Diagnosis not present

## 2019-10-18 LAB — CBC WITH DIFFERENTIAL/PLATELET
Abs Immature Granulocytes: 0.03 10*3/uL (ref 0.00–0.07)
Basophils Absolute: 0 10*3/uL (ref 0.0–0.1)
Basophils Relative: 0 %
Eosinophils Absolute: 0 10*3/uL (ref 0.0–0.5)
Eosinophils Relative: 0 %
HCT: 34.4 % — ABNORMAL LOW (ref 36.0–46.0)
Hemoglobin: 11.5 g/dL — ABNORMAL LOW (ref 12.0–15.0)
Immature Granulocytes: 0 %
Lymphocytes Relative: 9 %
Lymphs Abs: 1 10*3/uL (ref 0.7–4.0)
MCH: 31.3 pg (ref 26.0–34.0)
MCHC: 33.4 g/dL (ref 30.0–36.0)
MCV: 93.5 fL (ref 80.0–100.0)
Monocytes Absolute: 0.4 10*3/uL (ref 0.1–1.0)
Monocytes Relative: 3 %
Neutro Abs: 10.6 10*3/uL — ABNORMAL HIGH (ref 1.7–7.7)
Neutrophils Relative %: 88 %
Platelets: 237 10*3/uL (ref 150–400)
RBC: 3.68 MIL/uL — ABNORMAL LOW (ref 3.87–5.11)
RDW: 13.1 % (ref 11.5–15.5)
WBC: 12.1 10*3/uL — ABNORMAL HIGH (ref 4.0–10.5)
nRBC: 0 % (ref 0.0–0.2)

## 2019-10-18 LAB — URINALYSIS, COMPLETE (UACMP) WITH MICROSCOPIC
Bilirubin Urine: NEGATIVE
Glucose, UA: NEGATIVE mg/dL
Ketones, ur: NEGATIVE mg/dL
Leukocytes,Ua: NEGATIVE
Nitrite: NEGATIVE
Protein, ur: NEGATIVE mg/dL
Specific Gravity, Urine: >1.046 — ABNORMAL HIGH (ref 1.005–1.030)
pH: 6 (ref 5.0–8.0)

## 2019-10-18 LAB — BLOOD CULTURE ID PANEL (REFLEXED)

## 2019-10-18 LAB — COMPREHENSIVE METABOLIC PANEL
ALT: 18 U/L (ref 0–44)
AST: 21 U/L (ref 15–41)
Albumin: 3.8 g/dL (ref 3.5–5.0)
Alkaline Phosphatase: 72 U/L (ref 38–126)
Anion gap: 8 (ref 5–15)
BUN: 12 mg/dL (ref 6–20)
CO2: 26 mmol/L (ref 22–32)
Calcium: 8.5 mg/dL — ABNORMAL LOW (ref 8.9–10.3)
Chloride: 107 mmol/L (ref 98–111)
Creatinine, Ser: 1.05 mg/dL — ABNORMAL HIGH (ref 0.44–1.00)
GFR calc Af Amer: >60 mL/min (ref >60)
GFR calc non Af Amer: >60 mL/min (ref >60)
Glucose, Bld: 120 mg/dL — ABNORMAL HIGH (ref 70–99)
Potassium: 3.8 mmol/L (ref 3.5–5.1)
Sodium: 141 mmol/L (ref 135–145)
Total Bilirubin: 0.8 mg/dL (ref 0.3–1.2)
Total Protein: 7.6 g/dL (ref 6.5–8.1)

## 2019-10-18 LAB — PROTIME-INR
INR: 1.1 (ref 0.8–1.2)
Prothrombin Time: 13.9 seconds (ref 11.4–15.2)

## 2019-10-18 LAB — LACTIC ACID, PLASMA
Lactic Acid, Venous: 2.5 mmol/L (ref 0.5–1.9)
Lactic Acid, Venous: 2 mmol/L (ref 0.5–1.9)

## 2019-10-18 LAB — RESPIRATORY PANEL BY RT PCR (FLU A&B, COVID)
Influenza A by PCR: NEGATIVE
Influenza B by PCR: NEGATIVE
SARS Coronavirus 2 by RT PCR: NEGATIVE

## 2019-10-18 LAB — APTT: aPTT: 30 seconds (ref 24–36)

## 2019-10-18 LAB — LIPASE, BLOOD: Lipase: 39 U/L (ref 11–51)

## 2019-10-18 LAB — MRSA PCR SCREENING: MRSA by PCR: NEGATIVE

## 2019-10-18 LAB — PREGNANCY, URINE: Preg Test, Ur: NEGATIVE

## 2019-10-18 MED ORDER — LACTATED RINGERS IV SOLN: INTRAVENOUS | Status: DC

## 2019-10-18 MED ORDER — HYDROMORPHONE HCL 1 MG/ML IJ SOLN
0.5000 mg | Freq: Once | INTRAMUSCULAR | Status: AC
  Administered 2019-10-18: 0.5 mg via INTRAVENOUS
  Filled 2019-10-18: qty 1

## 2019-10-18 MED ORDER — ENOXAPARIN SODIUM 40 MG/0.4ML ~~LOC~~ SOLN: 40.0000 mg | SUBCUTANEOUS | Status: DC

## 2019-10-18 MED ORDER — IOHEXOL 300 MG/ML  SOLN
100.0000 mL | Freq: Once | INTRAMUSCULAR | Status: AC | PRN
  Administered 2019-10-18: 100 mL via INTRAVENOUS

## 2019-10-18 MED ORDER — ONDANSETRON HCL 4 MG PO TABS: 4.0000 mg | ORAL_TABLET | Freq: Four times a day (QID) | ORAL | Status: DC | PRN

## 2019-10-18 MED ORDER — PIPERACILLIN-TAZOBACTAM 3.375 G IVPB 30 MIN
3.3750 g | Freq: Once | INTRAVENOUS | Status: AC
  Administered 2019-10-18: 03:00:00 3.375 g via INTRAVENOUS
  Filled 2019-10-18: qty 50

## 2019-10-18 MED ORDER — LACTATED RINGERS IV BOLUS (SEPSIS)
250.0000 mL | Freq: Once | INTRAVENOUS | Status: AC
  Administered 2019-10-18: 06:00:00 250 mL via INTRAVENOUS

## 2019-10-18 MED ORDER — ACETAMINOPHEN 650 MG RE SUPP: 650.0000 mg | Freq: Four times a day (QID) | RECTAL | Status: DC | PRN

## 2019-10-18 MED ORDER — ACETAMINOPHEN 325 MG PO TABS
650.0000 mg | ORAL_TABLET | Freq: Four times a day (QID) | ORAL | Status: DC | PRN
  Administered 2019-10-18 – 2019-10-25 (×4): 650 mg via ORAL
  Filled 2019-10-18 (×4): qty 2

## 2019-10-18 MED ORDER — VANCOMYCIN HCL 1750 MG/350ML IV SOLN
1750.0000 mg | Freq: Three times a day (TID) | INTRAVENOUS | Status: DC
  Filled 2019-10-18 (×2): qty 350

## 2019-10-18 MED ORDER — VANCOMYCIN HCL 1750 MG/350ML IV SOLN
1750.0000 mg | Freq: Once | INTRAVENOUS | Status: AC
  Administered 2019-10-19: 1750 mg via INTRAVENOUS
  Filled 2019-10-18: qty 350

## 2019-10-18 MED ORDER — LACTATED RINGERS IV BOLUS (SEPSIS)
1000.0000 mL | Freq: Once | INTRAVENOUS | Status: AC
  Administered 2019-10-18: 04:00:00 1000 mL via INTRAVENOUS

## 2019-10-18 MED ORDER — ONDANSETRON HCL 4 MG/2ML IJ SOLN
4.0000 mg | Freq: Once | INTRAMUSCULAR | Status: AC
  Administered 2019-10-18: 03:00:00 4 mg via INTRAVENOUS
  Filled 2019-10-18: qty 2

## 2019-10-18 MED ORDER — METOPROLOL TARTRATE 25 MG PO TABS
25.0000 mg | ORAL_TABLET | Freq: Once | ORAL | Status: AC
  Administered 2019-10-18: 25 mg via ORAL
  Filled 2019-10-18: qty 1

## 2019-10-18 MED ORDER — MORPHINE SULFATE (PF) 4 MG/ML IV SOLN
4.0000 mg | INTRAVENOUS | Status: DC | PRN
  Administered 2019-10-18 – 2019-10-25 (×41): 4 mg via INTRAVENOUS
  Filled 2019-10-18 (×41): qty 1

## 2019-10-18 MED ORDER — PROMETHAZINE HCL 25 MG/ML IJ SOLN
25.0000 mg | Freq: Once | INTRAMUSCULAR | Status: AC
  Administered 2019-10-18: 18:00:00 25 mg via INTRAVENOUS
  Filled 2019-10-18: qty 1

## 2019-10-18 MED ORDER — PIPERACILLIN-TAZOBACTAM 3.375 G IVPB
3.3750 g | Freq: Three times a day (TID) | INTRAVENOUS | Status: DC
  Administered 2019-10-18 – 2019-10-24 (×18): 3.375 g via INTRAVENOUS
  Filled 2019-10-18 (×23): qty 50

## 2019-10-18 MED ORDER — ONDANSETRON HCL 4 MG/2ML IJ SOLN
4.0000 mg | Freq: Four times a day (QID) | INTRAMUSCULAR | Status: DC | PRN
  Administered 2019-10-18 – 2019-10-26 (×14): 4 mg via INTRAVENOUS
  Filled 2019-10-18 (×15): qty 2

## 2019-10-18 MED ORDER — SODIUM CHLORIDE 0.9 % IV BOLUS
1000.0000 mL | Freq: Once | INTRAVENOUS | Status: AC
  Administered 2019-10-18: 03:00:00 1000 mL via INTRAVENOUS

## 2019-10-18 NOTE — ED Provider Notes (Signed)
[facility] Emergency Department Provider Note   ____________________________________________   First MD Initiated Contact with Patient 10/18/19 0150     (approximate)  I have reviewed the triage vital signs and the nursing notes.   HISTORY  Chief Complaint Abdominal Pain    HPI Sara Chapman is a 44 y.o. female who presents to the ED from home with a chief complaint abdominal pain.  Patient has a recent history of diverticulitis of the large intestine with perforation and abscess.  She was hospitalized from 2/3 - 2/12; treated with IV antibiotics and percutaneous drains.  She presented back to the ED on 2/16 with increasing abdominal pain and fever after removal of one superficial drain.  Found to have small abscess within the mesentery on CT scan at that time.  Her pain and leukocytosis resolved with IV antibiotics and she was discharged on Augmentin.  Remaining drain was removed on 3/2 and she finished a 10-day course of Cipro and Flagyl.  She has been calling the surgery office because since she has finished the antibiotics she has had increasing abdominal pain over the last 3 days.  Onset of nausea and vomiting tonight.  Spoke with the on-call physician and directed to the ER for evaluation.  Patient reports low-grade fever of 99 F at home.  Denies cough, chest pain, shortness of breath, diarrhea.       Past Medical History:  Diagnosis Date  . Bowel perforation ([place])   . Psoriatic arthritis ([place])   . Stomach ulcer     Patient Active Problem List   Diagnosis Date Noted  . Diverticulitis large intestine 09/16/2019  . Diverticulitis of large intestine with perforation and abscess 09/03/2019    History reviewed. No pertinent surgical history.  Prior to Admission medications   Medication Sig Start Date End Date Taking? Authorizing Provider  COSENTYX SENSOREADY, 300 MG, 150 MG/ML SOAJ Inject 300 mg as directed once a week. 08/28/19   [provider]  HUMIRA PEN 40 MG/0.8ML PNKT Inject 1 Syringe into the skin every 14 (fourteen) days. 07/22/19   [provider]  omeprazole (PRILOSEC) 40 MG capsule Take 40 mg by mouth 2 (two) times daily. 05/07/19   [provider]  ondansetron (ZOFRAN-ODT) 4 MG disintegrating tablet Take 1 tablet (4 mg total) by mouth every 6 (six) hours as needed for nausea. 09/12/19   Tylene Fantasia, PA-C  oxyCODONE (OXY IR/ROXICODONE) 5 MG immediate release tablet Take 1 tablet (5 mg total) by mouth every 6 (six) hours as needed for severe pain or breakthrough pain. 09/12/19   Tylene Fantasia, PA-C  topiramate (TOPAMAX) 100 MG tablet Take 100 mg by mouth 2 (two) times daily. 05/07/19   [provider]    Allergies Clonazepam, Ibuprofen, Nsaids, Pseudoephedrine, Tolmetin, Pseudoephedrine hcl, and Tape  No family history on file.  Social History Social History   Tobacco Use  . Smoking status: Never Smoker  . Smokeless tobacco: Never Used  Substance Use Topics  . Alcohol use: Never  . Drug use: Never    Review of Systems  Constitutional: No fever/chills Eyes: No visual changes. ENT: No sore throat. Cardiovascular: Denies chest pain. Respiratory: Denies shortness of breath. Gastrointestinal: Positive for abdominal pain, nausea and vomiting.  No diarrhea.  No constipation. Genitourinary: Negative for dysuria. Musculoskeletal: Negative for back pain. Skin: Negative for rash. Neurological: Negative for headaches, focal weakness or numbness.   ____________________________________________   PHYSICAL EXAM:  VITAL SIGNS: ED Triage  Vitals  Enc Vitals Group     BP 10/17/19 2338 (!) 187/125     Pulse Rate 10/17/19 2338 (!) 125     Resp 10/17/19 2338 (!) 24     Temp 10/17/19 2338 98.7 F (37.1 C)     Temp Source 10/17/19 2338 Oral     SpO2 10/17/19 2338 95 %     Weight 10/17/19 2339 152 lb (68.9 kg)     Height 10/17/19 2339 4\' 11"  (1.499 m)     Head  Circumference --      Peak Flow --      Pain Score 10/17/19 2338 10     Pain Loc --      Pain Edu? --      Excl. in GC? --     Constitutional: Alert and oriented. Well appearing and in mild to moderate acute distress. Eyes: Conjunctivae are normal. PERRL. EOMI. Head: Atraumatic. Nose: No congestion/rhinnorhea. Mouth/Throat: Mucous membranes are moist.  Oropharynx non-erythematous. Neck: No stridor.   Cardiovascular: Normal rate, regular rhythm. Grossly normal heart sounds.  Good peripheral circulation. Respiratory: Normal respiratory effort.  No retractions. Lungs CTAB. Gastrointestinal: Soft with diffuse tenderness to palpation without rebound or guarding. No distention. No abdominal bruits. No CVA tenderness. Musculoskeletal: No lower extremity tenderness nor edema.  No joint effusions. Neurologic:  Normal speech and language. No gross focal neurologic deficits are appreciated. No gait instability. Skin:  Skin is warm, dry and intact. No rash noted. Psychiatric: Mood and affect are normal. Speech and behavior are normal.  ____________________________________________   LABS (all labs ordered are listed, but only abnormal results are displayed)  Labs Reviewed  COMPREHENSIVE METABOLIC PANEL - Abnormal; Notable for the following components:      Result Value   Glucose, Bld 120 (*)    Creatinine, Ser 1.05 (*)    Calcium 8.5 (*)    All other components within normal limits  CBC - Abnormal; Notable for the following components:   WBC 12.0 (*)    All other components within normal limits  RESPIRATORY PANEL BY RT PCR (FLU A&B, COVID)  CULTURE, BLOOD (ROUTINE X 2)  CULTURE, BLOOD (ROUTINE X 2)  LIPASE, BLOOD  URINALYSIS, COMPLETE (UACMP) WITH MICROSCOPIC  LACTIC ACID, PLASMA  LACTIC ACID, PLASMA  POC URINE PREG, ED   ____________________________________________  EKG  ED ECG REPORT I, Aadya Kindler J, the attending physician, personally viewed and interpreted this ECG.   Date:  10/18/2019  EKG Time: 2336  Rate: 126  Rhythm: sinus tachycardia  Axis: Normal  Intervals:none  ST&T Change: Nonspecific  ____________________________________________  RADIOLOGY  ED MD interpretation: Increased inflammatory change around sigmoid and descending colon indicating worsening/recurrent diverticulitis.  Small amounts of free air in left mid abdomen may be from prior percutaneous drains but recurrent perforation could also be the cause  Official radiology report(s): CT ABDOMEN PELVIS W CONTRAST  Result Date: 10/18/2019 CLINICAL DATA:  Abdominal distension EXAM: CT ABDOMEN AND PELVIS WITH CONTRAST TECHNIQUE: Multidetector CT imaging of the abdomen and pelvis was performed using the standard protocol following bolus administration of intravenous contrast. CONTRAST:  10/20/2019 OMNIPAQUE IOHEXOL 300 MG/ML  SOLN COMPARISON:  09/29/2019 FINDINGS: LOWER CHEST: Normal. HEPATOBILIARY: Normal hepatic contours. No intra- or extrahepatic biliary dilatation. Status post cholecystectomy. PANCREAS: Normal pancreas. No ductal dilatation or peripancreatic fluid collection. SPLEEN: Normal. ADRENALS/URINARY TRACT: The adrenal glands are normal. No hydronephrosis, nephroureterolithiasis or solid renal mass. The urinary bladder is normal for degree of distention STOMACH/BOWEL: There is a small  amounts of gas again seen in the left mid abdomen at the site of the now removed percutaneous drain. There is mild stranding surrounding the left upper quadrant small bowel and the junction of the sigmoid and descending colon. The degree of inflammatory changes greater than on the prior study. There is no discrete fluid collection. VASCULAR/LYMPHATIC: Normal course and caliber of the major abdominal vessels. No abdominal or pelvic lymphadenopathy. REPRODUCTIVE: Multiple uterine fibroids. MUSCULOSKELETAL. No bony spinal canal stenosis or focal osseous abnormality. OTHER: None. IMPRESSION: Increased inflammatory change  surrounding the left upper quadrant small bowel and the junction of the sigmoid and descending colon, likely indicating worsening/recurrent diverticulitis. Small amounts of free air in the left mid abdomen may have been introduced by the drainage catheter, but recurrent perforation could also cause this appearance. Electronically Signed   By: Deatra Robinson M.D.   On: 10/18/2019 01:33    ____________________________________________   PROCEDURES  Procedure(s) performed (including Critical Care):  .1-3 Lead EKG Interpretation Performed by: Irean Hong, MD Authorized by: Irean Hong, MD     Interpretation: normal     ECG rate:  97   ECG rate assessment: normal     Rhythm: sinus rhythm     Ectopy: none     Conduction: normal   Comments:     Patient placed on cardiac monitor to monitor for arrhythmias as she is receiving IV opiates     ____________________________________________   INITIAL IMPRESSION / ASSESSMENT AND PLAN / ED COURSE  As part of my medical decision making, I reviewed the following data within the electronic MEDICAL RECORD NUMBER Nursing notes reviewed and incorporated, Labs reviewed, EKG interpreted, Old chart reviewed, Radiograph reviewed, Discussed with admitting physician and Notes from prior ED visits     Sara Chapman was evaluated in Emergency Department on 10/18/2019 for the symptoms described in the history of present illness. She was evaluated in the context of the global COVID-19 pandemic, which necessitated consideration that the patient might be at risk for infection with the SARS-CoV-2 virus that causes COVID-19. Institutional protocols and algorithms that pertain to the evaluation of patients at risk for COVID-19 are in a state of rapid change based on information released by regulatory bodies including the CDC and federal and state organizations. These policies and algorithms were followed during the patient's care in the ED.     44 year old female  who presents with increasing abdominal pain, nausea/vomiting with recent history of diverticulitis with perforation and drainage status post percutaneous drains. Differential diagnosis includes, but is not limited to, ovarian cyst, ovarian torsion, acute appendicitis, diverticulitis, urinary tract infection/pyelonephritis, endometriosis, bowel obstruction, colitis, renal colic, gastroenteritis, hernia, fibroids, endometriosis, pregnancy related pain including ectopic pregnancy, etc.  Laboratory results revealed mild leukocytosis which is up from 1 month ago.  CT indicates worsening/recurrent diverticulitis with a small recurrent perforation.  Will redose IV analgesia/antiemetic, start IV antibiotic and discuss with general surgery.   Clinical Course as of Oct 17 237  Sat Oct 18, 2019  7035 Discussed case with surgery on-call Dr. Lady Gary who reviewed patient's CT scan.  Feel free air in the mid left abdomen is from prior percutaneous drains rather than recurrent perforation.  Will discuss with hospitalist services for admission.  Since patient just finished a course of Cipro and Flagyl, will start IV Zosyn for broad-spectrum antibiotic coverage.   [JS]    Clinical Course User Index [JS] Irean Hong, MD     ____________________________________________   FINAL  CLINICAL IMPRESSION(S) / ED DIAGNOSES  Final diagnoses:  Generalized abdominal pain  Nausea and vomiting, intractability of vomiting not specified, unspecified vomiting type  Diverticulitis     ED Discharge Orders    None       Note:  This document was prepared using Dragon voice recognition software and may include unintentional dictation errors.   Irean Hong, MD 10/18/19 [phone]

## 2019-10-18 NOTE — Consult Note (Addendum)
PHARMACY - PHYSICIAN COMMUNICATION CRITICAL VALUE ALERT - BLOOD CULTURE IDENTIFICATION (BCID)  Sara Chapman is an 44 y.o. female who presented to [facility] on 10/18/2019 with a chief complaint of sepsis d/t acute diverticulitis.   Assessment:  Anaerobic bottle 2/4 bottles; GPC- Staphylococcus species, MRSA detected (coagulase negative). WBC 12.1. Patient has been afebrile but with a low-grade fever this evening. Patient is currently on Zosyn (extended infusion).   Name of physician (or Provider) Contacted: Webb Silversmith, NP    Current antibiotics: Zosyn  Changes to prescribed antibiotics recommended: Vancomycin for MRSA bacteremia  Awaiting response from provider.    Update:  Webb Silversmith, NP confirmed starting Vancomycin   No results found for this or any previous visit.  [name] 10/18/2019  8:51 PM

## 2019-10-18 NOTE — Progress Notes (Signed)
The patient's husband reports the patient had a tick bite last week in the upper left side of her back. The side is a little red and has a scab over the site. MD and surgeon have been notified.

## 2019-10-18 NOTE — Progress Notes (Signed)
CODE SEPSIS - PHARMACY COMMUNICATION  **Broad Spectrum Antibiotics should be administered within 1 hour of Sepsis diagnosis**  Time Code Sepsis Called/Page Received: 0406  Antibiotics Ordered: Zosyn  Time of 1st antibiotic administration: 0300 (already given)  Additional action taken by pharmacy: n/a  If necessary, Name of Provider/Nurse Contacted: n/a    Wayland Denis ,PharmD Clinical Pharmacist  10/18/2019  4:11 AM

## 2019-10-18 NOTE — Progress Notes (Signed)
Same day rounding progress note  Patient seen and examined in the ED.  Please see Dr. Pernell Dupre dictated H&P for further details  Patient was sleepy when I saw her in the emergency department  * Sepsis [facility]) present on admission due to Acute diverticulitis -Patient presented with fever, tachycardia, tachypnea, leukocytosis, lactic acid of 2.5 and acute diverticulitis on CT -IV Zosyn -IV hydration, IV pain meds -Lactic acid trending down -Surgical consult -Pain under better control  Time spent: 15 minutes

## 2019-10-18 NOTE — H&P (Signed)
History and Physical    Sara Chapman OVF:[id number] DOB: May 08, 1976 DOA: 10/18/2019  PCP: Midge Minium, PA   Patient coming from: home  I have personally briefly reviewed patient's old medical records in [place]  Chief Complaint: Abdominal pain, fever  HPI: Sara Chapman is a 44 y.o. female with medical history significant for recurrent diverticulitis, recently hospitalized under the surgical service for treatment of diverticulitis with perforation and abscess, treated conservatively with percutaneous drains, removed on 09/29/2019 who presents to the emergency room with sudden onset left lower quadrant pain associated with one episode of emesis. Abdominal pain was severe and crampy in left lower quadrant, nonradiating with no alleviating or aggravating factors. Emesis was ingested food stuff.  ED Course: The emergency room she was afebrile at 98.7, hypertensive at 187/125, tachycardic at 125 and breathing at 24 breaths/min with O2 sat 95% on room air. White cell count was 12,000, lactic acid 2.5. CT abdomen and pelvis showed recurrent diverticulitis. The emergency room provider spoke with surgeon Dr. Celine Ahr who will see patient in consultation. Hospitalist service consulted for admission.  Review of Systems: As per HPI otherwise 10 point review of systems negative.    Past Medical History:  Diagnosis Date   Bowel perforation ([place])    Psoriatic arthritis ([place])    Stomach ulcer     History reviewed. No pertinent surgical history.   reports that she has never smoked. She has never used smokeless tobacco. She reports that she does not drink alcohol or use drugs.  Allergies  Allergen Reactions   Clonazepam Other (See Comments)    SUICIDAL THOUGHTS SUICIDAL THOUGHTS SUICIDAL THOUGHTS    Ibuprofen Other (See Comments)    stomach ulcers stomach ulcers stomach ulcers stomach ulcers    Nsaids Other (See Comments)    GI ulcers   Pseudoephedrine  Other (See Comments)    palpitations palpitations    Tolmetin Other (See Comments)    GI ulcers GI ulcers    Pseudoephedrine Hcl Other (See Comments) and Palpitations    Unable to tolerate Unable to tolerate    Tape Rash    Paper tape breaks down the skin and causes sores    No family history on file.   Prior to Admission medications   Medication Sig Start Date End Date Taking? Authorizing Provider  omeprazole (PRILOSEC) 40 MG capsule Take 40 mg by mouth 2 (two) times daily. 05/07/19  Yes [provider]  ondansetron (ZOFRAN-ODT) 4 MG disintegrating tablet Take 1 tablet (4 mg total) by mouth every 6 (six) hours as needed for nausea. 09/12/19  Yes Edison Simon R, PA-C  oxyCODONE (OXY IR/ROXICODONE) 5 MG immediate release tablet Take 1 tablet (5 mg total) by mouth every 6 (six) hours as needed for severe pain or breakthrough pain. 09/12/19  Yes Tylene Fantasia, PA-C  topiramate (TOPAMAX) 100 MG tablet Take 100 mg by mouth 2 (two) times daily. 05/07/19  Yes [provider]  COSENTYX SENSOREADY, 300 MG, 150 MG/ML SOAJ Inject 300 mg as directed once a week. 08/28/19   [provider]    Physical Exam: Vitals:   10/17/19 2338 10/17/19 2339  BP: (!) 187/125   Pulse: (!) 125   Resp: (!) 24   Temp: 98.7 F (37.1 C)   TempSrc: Oral   SpO2: 95%   Weight:  68.9 kg  Height:  4\' 11"  (1.499 m)     Vitals:   10/17/19 2338 10/17/19 2339  BP: (!) 187/125  Pulse: (!) 125   Resp: (!) 24   Temp: 98.7 F (37.1 C)   TempSrc: Oral   SpO2: 95%   Weight:  68.9 kg  Height:  4\' 11"  (1.499 m)    Constitutional: Alert and awake, oriented x3, in pain discomfort Eyes: PERLA, EOMI, irises appear normal, anicteric sclera,  ENMT: external ears and nose appear normal, normal hearing             Lips appears normal, oropharynx mucosa, tongue, posterior pharynx appear normal  Neck: neck appears normal, no masses, normal ROM, no thyromegaly, no JVD  CVS: S1-S2  clear, no murmur rubs or gallops,  , no carotid bruits, pedal pulses palpable, No LE edema Respiratory:  clear to auscultation bilaterally, no wheezing, rales or rhonchi. Respiratory effort normal. No accessory muscle use.  Abdomen: Tender in left and mid lower abdomen, normal bowel sounds, no hepatosplenomegaly, no hernias Musculoskeletal: : no cyanosis, clubbing , no contractures or atrophy Neuro: Cranial nerves II-XII intact, sensation, reflexes normal, strength Psych: judgement and insight appear normal, stable mood and affect,  Skin: no rashes or lesions or ulcers, no induration or nodules   Labs on Admission: I have personally reviewed following labs and imaging studies  CBC: Recent Labs  Lab 10/17/19 2347  WBC 12.0*  HGB 13.0  HCT 39.1  MCV 93.1  PLT 278   Basic Metabolic Panel: Recent Labs  Lab 10/17/19 2347  NA 141  K 3.8  CL 107  CO2 26  GLUCOSE 120*  BUN 12  CREATININE 1.05*  CALCIUM 8.5*   GFR: Estimated Creatinine Clearance: 57.1 mL/min (A) (by C-G formula based on SCr of 1.05 mg/dL (H)). Liver Function Tests: Recent Labs  Lab 10/17/19 2347  AST 21  ALT 18  ALKPHOS 72  BILITOT 0.8  PROT 7.6  ALBUMIN 3.8   Recent Labs  Lab 10/17/19 2347  LIPASE 39   No results for input(s): AMMONIA in the last 168 hours. Coagulation Profile: No results for input(s): INR, PROTIME in the last 168 hours. Cardiac Enzymes: No results for input(s): CKTOTAL, CKMB, CKMBINDEX, TROPONINI in the last 168 hours. BNP (last 3 results) No results for input(s): PROBNP in the last 8760 hours. HbA1C: No results for input(s): HGBA1C in the last 72 hours. CBG: No results for input(s): GLUCAP in the last 168 hours. Lipid Profile: No results for input(s): CHOL, HDL, LDLCALC, TRIG, CHOLHDL, LDLDIRECT in the last 72 hours. Thyroid Function Tests: No results for input(s): TSH, T4TOTAL, FREET4, T3FREE, THYROIDAB in the last 72 hours. Anemia Panel: No results for input(s):  VITAMINB12, FOLATE, FERRITIN, TIBC, IRON, RETICCTPCT in the last 72 hours. Urine analysis:    Component Value Date/Time   COLORURINE STRAW (A) 10/18/2019 0241   APPEARANCEUR CLEAR (A) 10/18/2019 0241   LABSPEC >1.046 (H) 10/18/2019 0241   PHURINE 6.0 10/18/2019 0241   GLUCOSEU NEGATIVE 10/18/2019 0241   HGBUR LARGE (A) 10/18/2019 0241   BILIRUBINUR NEGATIVE 10/18/2019 0241   KETONESUR NEGATIVE 10/18/2019 0241   PROTEINUR NEGATIVE 10/18/2019 0241   NITRITE NEGATIVE 10/18/2019 0241   LEUKOCYTESUR NEGATIVE 10/18/2019 0241    Radiological Exams on Admission: CT ABDOMEN PELVIS W CONTRAST  Result Date: 10/18/2019 CLINICAL DATA:  Abdominal distension EXAM: CT ABDOMEN AND PELVIS WITH CONTRAST TECHNIQUE: Multidetector CT imaging of the abdomen and pelvis was performed using the standard protocol following bolus administration of intravenous contrast. CONTRAST:  10/20/2019 OMNIPAQUE IOHEXOL 300 MG/ML  SOLN COMPARISON:  09/29/2019 FINDINGS: LOWER CHEST: Normal. HEPATOBILIARY: Normal hepatic  contours. No intra- or extrahepatic biliary dilatation. Status post cholecystectomy. PANCREAS: Normal pancreas. No ductal dilatation or peripancreatic fluid collection. SPLEEN: Normal. ADRENALS/URINARY TRACT: The adrenal glands are normal. No hydronephrosis, nephroureterolithiasis or solid renal mass. The urinary bladder is normal for degree of distention STOMACH/BOWEL: There is a small amounts of gas again seen in the left mid abdomen at the site of the now removed percutaneous drain. There is mild stranding surrounding the left upper quadrant small bowel and the junction of the sigmoid and descending colon. The degree of inflammatory changes greater than on the prior study. There is no discrete fluid collection. VASCULAR/LYMPHATIC: Normal course and caliber of the major abdominal vessels. No abdominal or pelvic lymphadenopathy. REPRODUCTIVE: Multiple uterine fibroids. MUSCULOSKELETAL. No bony spinal canal stenosis or focal  osseous abnormality. OTHER: None. IMPRESSION: Increased inflammatory change surrounding the left upper quadrant small bowel and the junction of the sigmoid and descending colon, likely indicating worsening/recurrent diverticulitis. Small amounts of free air in the left mid abdomen may have been introduced by the drainage catheter, but recurrent perforation could also cause this appearance. Electronically Signed   By: Deatra Robinson M.D.   On: 10/18/2019 01:33    EKG: Independently reviewed.   Assessment/Plan    Sepsis (HCC   Acute diverticulitis -Patient presented with fever, tachycardia, tachypnea, leukocytosis, lactic acid of 2.5 and acute diverticulitis on CT -IV Zosyn -IV hydration, IV pain meds -Surgical consult    DVT prophylaxis: Lovenox  Code Status: full code  Family Communication:  none  Disposition Plan: Back to previous home environment Consults called: Dr Lady Gary  Status:inp    Andris Baumann MD [facility]     10/18/2019, 3:56 AM

## 2019-10-18 NOTE — Consult Note (Signed)
Reason for Consult: Acute diverticulitis Referring Physician: Lindajo Royal, MD (hospital medicine)  Sara Chapman is an 44 y.o. female.  HPI: She is well-known to the general surgery service, having been cared for by Dr. Claudine Mouton for acute perforated diverticulitis.  She had 2 abscess drains placed.  She was rehospitalized after developing a fever when the first abscess drain was removed.  CT scan performed at that time, showed trace residual diverticulitis.  The second drain was removed on 30 September 2019.  She apparently began experiencing more abdominal pain after she completed her course of antibiotics.  She reports having attempted to try to reach our office several times, however she and our office kept missing each other, leaving messages.  On 18 March, the patient once again contacted our office, reporting that her pain was quite severe.  She reported nausea and vomiting.  She presented to the emergency department late on 19 March, with pain, nausea, vomiting, subjective fevers.  No diarrhea.  A repeat CT scan showed increased inflammation around the sigmoid colon, consistent with recurrent acute diverticulitis.  White blood cell count was elevated, along with lactic acid.  She was admitted to the hospital medicine service for ongoing management.  General surgery has been consulted in this context for evaluation and recommendations.  At the time of my evaluation, her pain had improved, however she was still fairly uncomfortable in the lower abdomen.  She had not had any further episodes of emesis.  She was initiated on Zosyn.  Past Medical History:  Diagnosis Date  . Bowel perforation (HCC)   . Psoriatic arthritis (HCC)   . Stomach ulcer     History reviewed. No pertinent surgical history.  History reviewed. No pertinent family history.  Social History:  reports that she has never smoked. She has never used smokeless tobacco. She reports that she does not drink alcohol or use  drugs.  Allergies:  Allergies  Allergen Reactions  . Clonazepam Other (See Comments)    SUICIDAL THOUGHTS SUICIDAL THOUGHTS SUICIDAL THOUGHTS   . Ibuprofen Other (See Comments)    stomach ulcers stomach ulcers stomach ulcers stomach ulcers   . Nsaids Other (See Comments)    GI ulcers  . Pseudoephedrine Other (See Comments)    palpitations palpitations   . Tolmetin Other (See Comments)    GI ulcers GI ulcers   . Pseudoephedrine Hcl Other (See Comments) and Palpitations    Unable to tolerate Unable to tolerate   . Tape Rash    Paper tape breaks down the skin and causes sores    Medications: I have reviewed the patient's current medications.  Results for orders placed or performed during the hospital encounter of 10/18/19 (from the past 48 hour(s))  Lipase, blood     Status: None   Collection Time: 10/17/19 11:47 PM  Result Value Ref Range   Lipase 39 11 - 51 U/L    Comment: Performed at [facility], [address] [place], [place] [address]  Comprehensive metabolic panel     Status: Abnormal   Collection Time: 10/17/19 11:47 PM  Result Value Ref Range   Sodium 141 135 - 145 mmol/L   Potassium 3.8 3.5 - 5.1 mmol/L   Chloride 107 98 - 111 mmol/L   CO2 26 22 - 32 mmol/L   Glucose, Bld 120 (H) 70 - 99 mg/dL    Comment: Glucose reference range applies only to samples taken after fasting for at least 8 hours.   BUN 12  6 - 20 mg/dL   Creatinine, Ser 1.05 (H) 0.44 - 1.00 mg/dL   Calcium 8.5 (L) 8.9 - 10.3 mg/dL   Total Protein 7.6 6.5 - 8.1 g/dL   Albumin 3.8 3.5 - 5.0 g/dL   AST 21 15 - 41 U/L   ALT 18 0 - 44 U/L   Alkaline Phosphatase 72 38 - 126 U/L   Total Bilirubin 0.8 0.3 - 1.2 mg/dL   GFR calc non Af Amer >60 >60 mL/min   GFR calc Af Amer >60 >60 mL/min   Anion gap 8 5 - 15    Comment: Performed at [facility], [address] [place], [place] [address]  CBC     Status: Abnormal   Collection Time: 10/17/19 11:47 PM  Result Value  Ref Range   WBC 12.0 (H) 4.0 - 10.5 K/uL   RBC 4.20 3.87 - 5.11 MIL/uL   Hemoglobin 13.0 12.0 - 15.0 g/dL   HCT 39.1 36.0 - 46.0 %   MCV 93.1 80.0 - 100.0 fL   MCH 31.0 26.0 - 34.0 pg   MCHC 33.2 30.0 - 36.0 g/dL   RDW 13.0 11.5 - 15.5 %   Platelets 278 150 - 400 K/uL   nRBC 0.0 0.0 - 0.2 %    Comment: Performed at [facility], [address] [place], [place] [address]  Urinalysis, Complete w Microscopic     Status: Abnormal   Collection Time: 10/18/19  2:41 AM  Result Value Ref Range   Color, Urine STRAW (A) YELLOW   APPearance CLEAR (A) CLEAR   Specific Gravity, Urine >1.046 (H) 1.005 - 1.030   pH 6.0 5.0 - 8.0   Glucose, UA NEGATIVE NEGATIVE mg/dL   Hgb urine dipstick LARGE (A) NEGATIVE   Bilirubin Urine NEGATIVE NEGATIVE   Ketones, ur NEGATIVE NEGATIVE mg/dL   Protein, ur NEGATIVE NEGATIVE mg/dL   Nitrite NEGATIVE NEGATIVE   Leukocytes,Ua NEGATIVE NEGATIVE   RBC / HPF 21-50 0 - 5 RBC/hpf   WBC, UA 6-10 0 - 5 WBC/hpf   Bacteria, UA RARE (A) NONE SEEN   Squamous Epithelial / LPF 0-5 0 - 5   Mucus PRESENT     Comment: Performed at [facility], [address], [place], [place] [address]  Respiratory Panel by RT PCR (Flu A&B, Covid) -     Status: None   Collection Time: 10/18/19  2:41 AM  Result Value Ref Range   SARS Coronavirus 2 by RT PCR NEGATIVE NEGATIVE    Comment: (NOTE) SARS-CoV-2 target nucleic acids are NOT DETECTED. The SARS-CoV-2 RNA is generally detectable in upper respiratoy specimens during the acute phase of infection. The lowest concentration of SARS-CoV-2 viral copies this assay can detect is 131 copies/mL. A negative result does not preclude SARS-Cov-2 infection and should not be used as the sole basis for treatment or other patient management decisions. A negative result may occur with  improper specimen collection/handling, submission of specimen other than nasopharyngeal swab, presence of viral mutation(s) within the areas  targeted by this assay, and inadequate number of viral copies (<131 copies/mL). A negative result must be combined with clinical observations, patient history, and epidemiological information. The expected result is Negative. Fact Sheet for Patients:  PinkCheek.be Fact Sheet for Healthcare Providers:  GravelBags.it This test is not yet ap proved or cleared by the [place] FDA and  has been authorized for detection and/or diagnosis of SARS-CoV-2 by FDA under an Emergency Use Authorization (EUA). This  EUA will remain  in effect (meaning this test can be used) for the duration of the COVID-19 declaration under Section 564(b)(1) of the Act, 21 U.S.C. section 360bbb-3(b)(1), unless the authorization is terminated or revoked sooner.    Influenza A by PCR NEGATIVE NEGATIVE   Influenza B by PCR NEGATIVE NEGATIVE    Comment: (NOTE) The Xpert Xpress SARS-CoV-2/FLU/RSV assay is intended as an aid in  the diagnosis of influenza from Nasopharyngeal swab specimens and  should not be used as a sole basis for treatment. Nasal washings and  aspirates are unacceptable for Xpert Xpress SARS-CoV-2/FLU/RSV  testing. Fact Sheet for Patients: https://www.moore.com/ Fact Sheet for Healthcare Providers: https://www.young.biz/ This test is not yet approved or cleared by the [place] FDA and  has been authorized for detection and/or diagnosis of SARS-CoV-2 by  FDA under an Emergency Use Authorization (EUA). This EUA will remain  in effect (meaning this test can be used) for the duration of the  Covid-19 declaration under Section 564(b)(1) of the Act, 21  U.S.C. section 360bbb-3(b)(1), unless the authorization is  terminated or revoked. Performed at [facility], [address] [place], [place] [address]   Culture, blood (routine x 2)     Status: None (Preliminary result)   Collection Time:  10/18/19  2:41 AM   Specimen: BLOOD  Result Value Ref Range   Specimen Description BLOOD RIGHT FOREARM    Special Requests      BOTTLES DRAWN AEROBIC AND ANAEROBIC Blood Culture adequate volume   Culture  Setup Time Organism ID to follow    Culture      NO GROWTH < 12 HOURS Performed at [facility], [address] [place], [place] [address]    Report Status PENDING   Culture, blood (routine x 2)     Status: None (Preliminary result)   Collection Time: 10/18/19  2:41 AM   Specimen: BLOOD  Result Value Ref Range   Specimen Description BLOOD LEFT ANTECUBITAL    Special Requests      BOTTLES DRAWN AEROBIC AND ANAEROBIC Blood Culture adequate volume   Culture  Setup Time      GRAM POSITIVE COCCI ANAEROBIC BOTTLE ONLY CRITICAL RESULT CALLED TO, READ BACK BY AND VERIFIED WITH: [facility] DUNCAN AT 1742 10/18/19.PMF Performed at [facility], [address] [place], [place] [address]    Culture GRAM POSITIVE COCCI    Report Status PENDING   Lactic acid, plasma     Status: Abnormal   Collection Time: 10/18/19  2:41 AM  Result Value Ref Range   Lactic Acid, Venous 2.5 (HH) 0.5 - 1.9 mmol/L    Comment: CRITICAL RESULT CALLED TO, READ BACK BY AND VERIFIED WITH VANESSA ASHELEY ON 10/18/19 AT [facility] Performed at [facility], [address], [place], [place] [address]   Pregnancy, urine     Status: None   Collection Time: 10/18/19  2:41 AM  Result Value Ref Range   Preg Test, Ur NEGATIVE NEGATIVE    Comment: Performed at [facility], [address] [place], [place] [address]  Lactic acid, plasma     Status: Abnormal   Collection Time: 10/18/19  4:21 AM  Result Value Ref Range   Lactic Acid, Venous 2.0 (HH) 0.5 - 1.9 mmol/L    Comment: CRITICAL VALUE NOTED. VALUE IS CONSISTENT WITH PREVIOUSLY REPORTED/CALLED VALUE [facility] Performed at [facility], [address], [place], [place] [address]   CBC with Differential  Status: Abnormal    Collection Time: 10/18/19  4:21 AM  Result Value Ref Range   WBC 12.1 (H) 4.0 - 10.5 K/uL   RBC 3.68 (L) 3.87 - 5.11 MIL/uL   Hemoglobin 11.5 (L) 12.0 - 15.0 g/dL   HCT 91.434.4 (L) 78.236.0 - 95.646.0 %   MCV 93.5 80.0 - 100.0 fL   MCH 31.3 26.0 - 34.0 pg   MCHC 33.4 30.0 - 36.0 g/dL   RDW 21.313.1 08.611.5 - 57.815.5 %   Platelets 237 150 - 400 K/uL   nRBC 0.0 0.0 - 0.2 %   Neutrophils Relative % 88 %   Neutro Abs 10.6 (H) 1.7 - 7.7 K/uL   Lymphocytes Relative 9 %   Lymphs Abs 1.0 0.7 - 4.0 K/uL   Monocytes Relative 3 %   Monocytes Absolute 0.4 0.1 - 1.0 K/uL   Eosinophils Relative 0 %   Eosinophils Absolute 0.0 0.0 - 0.5 K/uL   Basophils Relative 0 %   Basophils Absolute 0.0 0.0 - 0.1 K/uL   Immature Granulocytes 0 %   Abs Immature Granulocytes 0.03 0.00 - 0.07 K/uL    Comment: Performed at [facility], [address] [place], [place] [address]  Protime-INR     Status: None   Collection Time: 10/18/19  4:21 AM  Result Value Ref Range   Prothrombin Time 13.9 11.4 - 15.2 seconds   INR 1.1 0.8 - 1.2    Comment: (NOTE) INR goal varies based on device and disease states. Performed at [facility], [address] [place], [place] [address]   APTT     Status: None   Collection Time: 10/18/19  4:21 AM  Result Value Ref Range   aPTT 30 24 - 36 seconds    Comment: Performed at [facility], [address] [place], [place] [address]    CT ABDOMEN PELVIS W CONTRAST  Result Date: 10/18/2019 CLINICAL DATA:  Abdominal distension EXAM: CT ABDOMEN AND PELVIS WITH CONTRAST TECHNIQUE: Multidetector CT imaging of the abdomen and pelvis was performed using the standard protocol following bolus administration of intravenous contrast. CONTRAST:  100mL OMNIPAQUE IOHEXOL 300 MG/ML  SOLN COMPARISON:  09/29/2019 FINDINGS: LOWER CHEST: Normal. HEPATOBILIARY: Normal hepatic contours. No intra- or extrahepatic biliary dilatation. Status post cholecystectomy. PANCREAS: Normal pancreas. No  ductal dilatation or peripancreatic fluid collection. SPLEEN: Normal. ADRENALS/URINARY TRACT: The adrenal glands are normal. No hydronephrosis, nephroureterolithiasis or solid renal mass. The urinary bladder is normal for degree of distention STOMACH/BOWEL: There is a small amounts of gas again seen in the left mid abdomen at the site of the now removed percutaneous drain. There is mild stranding surrounding the left upper quadrant small bowel and the junction of the sigmoid and descending colon. The degree of inflammatory changes greater than on the prior study. There is no discrete fluid collection. VASCULAR/LYMPHATIC: Normal course and caliber of the major abdominal vessels. No abdominal or pelvic lymphadenopathy. REPRODUCTIVE: Multiple uterine fibroids. MUSCULOSKELETAL. No bony spinal canal stenosis or focal osseous abnormality. OTHER: None. IMPRESSION: Increased inflammatory change surrounding the left upper quadrant small bowel and the junction of the sigmoid and descending colon, likely indicating worsening/recurrent diverticulitis. Small amounts of free air in the left mid abdomen may have been introduced by the drainage catheter, but recurrent perforation could also cause this appearance. Electronically Signed   By: Deatra RobinsonKevin  Herman M.D.   On: 10/18/2019 01:33    Review of Systems  All other systems reviewed and are negative. Or as  discussed in the history of present illness. Blood pressure 132/79, pulse 98, temperature 99.6 F (37.6 C), temperature source Oral, resp. rate 18, height 4\' 11"  (1.499 m), weight 68.9 kg, last menstrual period 10/15/2019, SpO2 98 %. Physical Exam  Constitutional: She is oriented to person, place, and time. She appears well-developed and well-nourished.  Appears uncomfortable.  HENT:  Head: Normocephalic and atraumatic.  Nose: Nose normal.  Eyes: Right eye exhibits no discharge. Left eye exhibits no discharge. No scleral icterus.  Neck: No tracheal deviation present.   Cardiovascular: Normal rate and regular rhythm.  Respiratory: Effort normal. No stridor. No respiratory distress.  GI: Soft. There is abdominal tenderness. There is no rebound and no guarding.  She is tender to palpation basically everywhere from the umbilicus to the pubis.  No rebound, guarding, or other peritoneal signs.  Genitourinary:    Genitourinary Comments: Deferred   Musculoskeletal:        General: No deformity or edema.  Neurological: She is alert and oriented to person, place, and time.  Skin: Skin is warm and dry.  Psychiatric: She has a normal mood and affect. Her behavior is normal.    Assessment/Plan: This is a 43 year old woman who had complicated diverticulitis.  She was treated with antibiotics and abscess drainage.  Since her last drain was removed and she completed her course of antibiotics, she has had increased abdominal pain, worsening to the point that she presented to the emergency department last night.  CT scan shows worsening diverticulitis.  She has been admitted to hospital medicine.  -N.p.o. -IV fluids -IV antibiotics -Monitor lactic acid, leukocytosis, and abdominal exam. -General surgery will continue to follow.   54 10/18/2019, 7:52 PM

## 2019-10-18 NOTE — ED Notes (Signed)
Report to [place], Charity fundraiser

## 2019-10-18 NOTE — Consult Note (Signed)
PHARMACY - PHYSICIAN COMMUNICATION CRITICAL VALUE ALERT - BLOOD CULTURE IDENTIFICATION (BCID)  Sara Chapman is an 44 y.o. female who presented to [facility] on 10/18/2019 with a chief complaint of sepsis d/t acute diverticulitis.   Assessment:  Anaerobic bottle 1/4; GPC. WBC 12.1. Patient has been afebrile but with a low-grade fever this evening. Patient is currently on Zosyn (extended infusion). This is likely a contaminant.     Name of physician (or Provider) Contacted: Dr. Sherryll Burger  Current antibiotics: Zosyn  Changes to prescribed antibiotics recommended:  Patient is on recommended antibiotics - No changes needed  No results found for this or any previous visit.  [name] 10/18/2019  5:49 PM

## 2019-10-19 LAB — CBC
HCT: 35.1 % — ABNORMAL LOW (ref 36.0–46.0)
Hemoglobin: 11.4 g/dL — ABNORMAL LOW (ref 12.0–15.0)
MCH: 30.7 pg (ref 26.0–34.0)
MCHC: 32.5 g/dL (ref 30.0–36.0)
MCV: 94.6 fL (ref 80.0–100.0)
Platelets: 224 10*3/uL (ref 150–400)
RBC: 3.71 MIL/uL — ABNORMAL LOW (ref 3.87–5.11)
RDW: 13.1 % (ref 11.5–15.5)
WBC: 12.1 10*3/uL — ABNORMAL HIGH (ref 4.0–10.5)
nRBC: 0 % (ref 0.0–0.2)

## 2019-10-19 LAB — BASIC METABOLIC PANEL
Anion gap: 10 (ref 5–15)
BUN: 9 mg/dL (ref 6–20)
CO2: 24 mmol/L (ref 22–32)
Calcium: 8.1 mg/dL — ABNORMAL LOW (ref 8.9–10.3)
Chloride: 103 mmol/L (ref 98–111)
Creatinine, Ser: 1.04 mg/dL — ABNORMAL HIGH (ref 0.44–1.00)
GFR calc Af Amer: >60 mL/min (ref >60)
GFR calc non Af Amer: >60 mL/min (ref >60)
Glucose, Bld: 90 mg/dL (ref 70–99)
Potassium: 3.6 mmol/L (ref 3.5–5.1)
Sodium: 137 mmol/L (ref 135–145)

## 2019-10-19 LAB — GLUCOSE, CAPILLARY
Glucose-Capillary: 68 mg/dL — ABNORMAL LOW (ref 70–99)
Glucose-Capillary: 83 mg/dL (ref 70–99)

## 2019-10-19 MED ORDER — VANCOMYCIN HCL IN DEXTROSE 1-5 GM/200ML-% IV SOLN
1000.0000 mg | INTRAVENOUS | Status: DC
  Administered 2019-10-20 – 2019-10-23 (×4): 1000 mg via INTRAVENOUS
  Filled 2019-10-19 (×7): qty 200

## 2019-10-19 MED ORDER — DEXTROSE IN LACTATED RINGERS 5 % IV SOLN
INTRAVENOUS | Status: DC
  Administered 2019-10-20: 100 mL/h via INTRAVENOUS

## 2019-10-19 MED ORDER — CHLORHEXIDINE GLUCONATE CLOTH 2 % EX PADS
6.0000 | MEDICATED_PAD | Freq: Every day | CUTANEOUS | Status: DC
  Administered 2019-10-19 – 2019-10-21 (×2): 6 via TOPICAL

## 2019-10-19 NOTE — Progress Notes (Signed)
1        [place] at [facility]   PATIENT NAME: Sara Chapman    MR#:  [id number]  DATE OF BIRTH:  12-08-1975  SUBJECTIVE:  CHIEF COMPLAINT:   Chief Complaint  Patient presents with  . Abdominal Pain  Seems very uncomfortable with pain in the belly.  Patient reports tick bite last week on the upper left side of back REVIEW OF SYSTEMS:  Review of Systems  Constitutional: Negative for diaphoresis, fever, malaise/fatigue and weight loss.  HENT: Negative for ear discharge, ear pain, hearing loss, nosebleeds, sore throat and tinnitus.   Eyes: Negative for blurred vision and pain.  Respiratory: Negative for cough, hemoptysis, shortness of breath and wheezing.   Cardiovascular: Negative for chest pain, palpitations, orthopnea and leg swelling.  Gastrointestinal: Positive for abdominal pain and nausea. Negative for blood in stool, constipation, diarrhea, heartburn and vomiting.  Genitourinary: Negative for dysuria, frequency and urgency.  Musculoskeletal: Negative for back pain and myalgias.  Skin: Negative for itching and rash.  Neurological: Negative for dizziness, tingling, tremors, focal weakness, seizures, weakness and headaches.  Psychiatric/Behavioral: Negative for depression. The patient is not nervous/anxious.    DRUG ALLERGIES:   Allergies  Allergen Reactions  . Clonazepam Other (See Comments)    SUICIDAL THOUGHTS SUICIDAL THOUGHTS SUICIDAL THOUGHTS   . Ibuprofen Other (See Comments)    stomach ulcers stomach ulcers stomach ulcers stomach ulcers   . Nsaids Other (See Comments)    GI ulcers  . Pseudoephedrine Other (See Comments)    palpitations palpitations   . Tolmetin Other (See Comments)    GI ulcers GI ulcers   . Pseudoephedrine Hcl Other (See Comments) and Palpitations    Unable to tolerate Unable to tolerate   . Tape Rash    Paper tape breaks down the skin and causes sores   VITALS:  Blood pressure 108/78, pulse 85, temperature 99.4 F  (37.4 C), temperature source Oral, resp. rate 18, height 4\' 11"  (1.499 m), weight 68.9 kg, last menstrual period 10/15/2019, SpO2 96 %. PHYSICAL EXAMINATION:  Physical Exam HENT:     Head: Normocephalic and atraumatic.  Eyes:     Conjunctiva/sclera: Conjunctivae normal.     Pupils: Pupils are equal, round, and reactive to light.  Neck:     Thyroid: No thyromegaly.     Trachea: No tracheal deviation.  Cardiovascular:     Rate and Rhythm: Normal rate and regular rhythm.     Heart sounds: Normal heart sounds.  Pulmonary:     Effort: Pulmonary effort is normal. No respiratory distress.     Breath sounds: Normal breath sounds. No wheezing.  Chest:     Chest wall: No tenderness.  Abdominal:     General: Bowel sounds are normal. There is no distension.     Palpations: Abdomen is soft.     Tenderness: There is generalized abdominal tenderness.  Musculoskeletal:        General: Normal range of motion.     Cervical back: Normal range of motion and neck supple.  Skin:    General: Skin is warm and dry.     Findings: No rash.  Neurological:     Mental Status: She is alert and oriented to person, place, and time.     Cranial Nerves: No cranial nerve deficit.    LABORATORY PANEL:  Female CBC Recent Labs  Lab 10/19/19 0537  WBC 12.1*  HGB 11.4*  HCT 35.1*  PLT 224   ------------------------------------------------------------------------------------------------------------------ Chemistries  Recent Labs  Lab 10/17/19 2347 10/17/19 2347 10/19/19 0537  NA 141   < > 137  K 3.8   < > 3.6  CL 107   < > 103  CO2 26   < > 24  GLUCOSE 120*   < > 90  BUN 12   < > 9  CREATININE 1.05*   < > 1.04*  CALCIUM 8.5*   < > 8.1*  AST 21  --   --   ALT 18  --   --   ALKPHOS 72  --   --   BILITOT 0.8  --   --    < > = values in this interval not displayed.   RADIOLOGY:  No results found. ASSESSMENT AND PLAN:  44 y.o. female with medical history significant for recurrent diverticulitis,  recently hospitalized under the surgical service for treatment of diverticulitis with perforation and abscess, treated conservatively with percutaneous drains, removed on 09/29/2019 who presents to the emergency room with sudden onset left lower quadrant pain associated with one episode of emesis. Abdominal pain was severe and crampy in left lower quadrant, nonradiating with no alleviating or aggravating factors. Emesis was ingested food stuff.  * Sepsis ([place]) present on admission due to Acute diverticulitis -recurrent in nature -Patient presented with fever, tachycardia, tachypnea, leukocytosis, lactic acid of 2.5 and acute diverticulitis on CT -continue IV Zosyn, vancomycin added last evening as BCID positive for MRSA -Consult infectious disease -IV hydration, IV pain meds -Lactic acid trending down -Surgical input appreciated -no current plans for surgery per Dr. Celine Ahr -Pain under better control  *Recent tick bite -A little redness and scab over her back -Patient remembers taking out disc.  Will check RMSF titers   DVT prophylaxis: SCD's Family Communication: NO "discussed with patient" Disposition Plan: Home Barriers to DC - improvement in clinical condition.  Still having significant abdominal pain - she may require surgery  All the records are reviewed and case discussed with Care Management/Social Worker. Management plans discussed with the patient, nursing and they are in agreement.  CODE STATUS: Full Code  TOTAL TIME TAKING CARE OF THIS PATIENT: 35 minutes.   More than 50% of the time was spent in counseling/coordination of care: YES  POSSIBLE D/C IN 2-3 DAYS, DEPENDING ON CLINICAL CONDITION.   Max Sane M.D on 10/19/2019 at 2:59 PM  [facility]   CC: Primary care physician; Midge Minium, PA  Note: This dictation was prepared with Dragon dictation along with smaller phrase technology. Any transcriptional errors that result from this process are  unintentional.

## 2019-10-19 NOTE — Progress Notes (Signed)
Pharmacy Antibiotic Note  Sara Chapman is a 44 y.o. female admitted on 10/18/2019 with bacteremia.  Pharmacy has been consulted for Vancomycin dosing.   Pt received Vanc 1750mg  loading dose  Plan: Vancomycin 1000 mg IV Q 24 hrs. Goal AUC 400-550. Expected AUC: 472.1, Css 11.8 SCr used: 1.05   Height: 4\' 11"  (149.9 cm) Weight: 152 lb (68.9 kg) IBW/kg (Calculated) : 43.2  Temp (24hrs), Avg:99.3 F (37.4 C), Min:99 F (37.2 C), Max:99.6 F (37.6 C)  Recent Labs  Lab 10/17/19 2347 10/18/19 0241 10/18/19 0421  WBC 12.0*  --  12.1*  CREATININE 1.05*  --   --   LATICACIDVEN  --  2.5* 2.0*    Estimated Creatinine Clearance: 57.1 mL/min (A) (by C-G formula based on SCr of 1.05 mg/dL (H)).    Allergies  Allergen Reactions  . Clonazepam Other (See Comments)    SUICIDAL THOUGHTS SUICIDAL THOUGHTS SUICIDAL THOUGHTS   . Ibuprofen Other (See Comments)    stomach ulcers stomach ulcers stomach ulcers stomach ulcers   . Nsaids Other (See Comments)    GI ulcers  . Pseudoephedrine Other (See Comments)    palpitations palpitations   . Tolmetin Other (See Comments)    GI ulcers GI ulcers   . Pseudoephedrine Hcl Other (See Comments) and Palpitations    Unable to tolerate Unable to tolerate   . Tape Rash    Paper tape breaks down the skin and causes sores    Antimicrobials this admission:   >>    >>   Dose adjustments this admission:   Microbiology results:  BCx:   UCx:    Sputum:    MRSA PCR:   Thank you for allowing pharmacy to be a part of this patient's care.  10/20/19 A 10/19/2019 3:47 AM

## 2019-10-19 NOTE — Consult Note (Signed)
Reason for Consult: Acute diverticulitis Referring Physician: Judd Gaudier, MD (hospital medicine)  Sara Chapman is an 44 y.o. female.  HPI: She is well-known to the general surgery service, having been cared for by Dr. Christian Mate for acute perforated diverticulitis.  She had 2 abscess drains placed.  She was rehospitalized after developing a fever when the first abscess drain was removed.  CT scan performed at that time, showed trace residual diverticulitis.  The second drain was removed on 30 September 2019.  She apparently began experiencing more abdominal pain after she completed her course of antibiotics.  She reports having attempted to try to reach our office several times, however she and our office kept missing each other, leaving messages.  On 18 March, the patient once again contacted our office, reporting that her pain was quite severe.  She reported nausea and vomiting.  She presented to the emergency department late on 19 March, with pain, nausea, vomiting, subjective fevers.  No diarrhea.  A repeat CT scan showed increased inflammation around the sigmoid colon, consistent with recurrent acute diverticulitis.  White blood cell count was elevated, along with lactic acid.  She was admitted to the hospital medicine service for ongoing management.  General surgery has been consulted in this context for evaluation and recommendations.  At the time of my evaluation, her pain had improved, however she was still fairly uncomfortable in the lower abdomen.  She had not had any further episodes of emesis.  She was initiated on Zosyn.  Interval events: She states that there has been no significant improvement in her symptoms.  Her white blood cell count is stable.  Past Medical History:  Diagnosis Date  . Bowel perforation ([place])   . Psoriatic arthritis ([place])   . Stomach ulcer     History reviewed. No pertinent surgical history.  History reviewed. No pertinent family history.  Social History:   reports that she has never smoked. She has never used smokeless tobacco. She reports that she does not drink alcohol or use drugs.  Allergies:  Allergies  Allergen Reactions  . Clonazepam Other (See Comments)    SUICIDAL THOUGHTS SUICIDAL THOUGHTS SUICIDAL THOUGHTS   . Ibuprofen Other (See Comments)    stomach ulcers stomach ulcers stomach ulcers stomach ulcers   . Nsaids Other (See Comments)    GI ulcers  . Pseudoephedrine Other (See Comments)    palpitations palpitations   . Tolmetin Other (See Comments)    GI ulcers GI ulcers   . Pseudoephedrine Hcl Other (See Comments) and Palpitations    Unable to tolerate Unable to tolerate   . Tape Rash    Paper tape breaks down the skin and causes sores    Medications: I have reviewed the patient's current medications.  Results for orders placed or performed during the hospital encounter of 10/18/19 (from the past 48 hour(s))  Lipase, blood     Status: None   Collection Time: 10/17/19 11:47 PM  Result Value Ref Range   Lipase 39 11 - 51 U/L    Comment: Performed at [facility], [address] [place], [place] [address]  Comprehensive metabolic panel     Status: Abnormal   Collection Time: 10/17/19 11:47 PM  Result Value Ref Range   Sodium 141 135 - 145 mmol/L   Potassium 3.8 3.5 - 5.1 mmol/L   Chloride 107 98 - 111 mmol/L   CO2 26 22 - 32 mmol/L   Glucose, Bld 120 (H) 70 - 99 mg/dL  Comment: Glucose reference range applies only to samples taken after fasting for at least 8 hours.   BUN 12 6 - 20 mg/dL   Creatinine, Ser 1.61 (H) 0.44 - 1.00 mg/dL   Calcium 8.5 (L) 8.9 - 10.3 mg/dL   Total Protein 7.6 6.5 - 8.1 g/dL   Albumin 3.8 3.5 - 5.0 g/dL   AST 21 15 - 41 U/L   ALT 18 0 - 44 U/L   Alkaline Phosphatase 72 38 - 126 U/L   Total Bilirubin 0.8 0.3 - 1.2 mg/dL   GFR calc non Af Amer >60 >60 mL/min   GFR calc Af Amer >60 >60 mL/min   Anion gap 8 5 - 15    Comment: Performed at [facility],  [address] [place], [place] [address]  CBC     Status: Abnormal   Collection Time: 10/17/19 11:47 PM  Result Value Ref Range   WBC 12.0 (H) 4.0 - 10.5 K/uL   RBC 4.20 3.87 - 5.11 MIL/uL   Hemoglobin 13.0 12.0 - 15.0 g/dL   HCT 54.0 98.1 - 19.1 %   MCV 93.1 80.0 - 100.0 fL   MCH 31.0 26.0 - 34.0 pg   MCHC 33.2 30.0 - 36.0 g/dL   RDW 47.8 29.5 - 62.1 %   Platelets 278 150 - 400 K/uL   nRBC 0.0 0.0 - 0.2 %    Comment: Performed at [facility], [address] [place], [place] [address]  Urinalysis, Complete w Microscopic     Status: Abnormal   Collection Time: 10/18/19  2:41 AM  Result Value Ref Range   Color, Urine STRAW (A) YELLOW   APPearance CLEAR (A) CLEAR   Specific Gravity, Urine >1.046 (H) 1.005 - 1.030   pH 6.0 5.0 - 8.0   Glucose, UA NEGATIVE NEGATIVE mg/dL   Hgb urine dipstick LARGE (A) NEGATIVE   Bilirubin Urine NEGATIVE NEGATIVE   Ketones, ur NEGATIVE NEGATIVE mg/dL   Protein, ur NEGATIVE NEGATIVE mg/dL   Nitrite NEGATIVE NEGATIVE   Leukocytes,Ua NEGATIVE NEGATIVE   RBC / HPF 21-50 0 - 5 RBC/hpf   WBC, UA 6-10 0 - 5 WBC/hpf   Bacteria, UA RARE (A) NONE SEEN   Squamous Epithelial / LPF 0-5 0 - 5   Mucus PRESENT     Comment: Performed at [facility], [address] [place], [place] [address]  Respiratory Panel by RT PCR (Flu A&B, Covid) -     Status: None   Collection Time: 10/18/19  2:41 AM  Result Value Ref Range   SARS Coronavirus 2 by RT PCR NEGATIVE NEGATIVE    Comment: (NOTE) SARS-CoV-2 target nucleic acids are NOT DETECTED. The SARS-CoV-2 RNA is generally detectable in upper respiratoy specimens during the acute phase of infection. The lowest concentration of SARS-CoV-2 viral copies this assay can detect is 131 copies/mL. A negative result does not preclude SARS-Cov-2 infection and should not be used as the sole basis for treatment or other patient management decisions. A negative result may occur with  improper specimen  collection/handling, submission of specimen other than nasopharyngeal swab, presence of viral mutation(s) within the areas targeted by this assay, and inadequate number of viral copies (<131 copies/mL). A negative result must be combined with clinical observations, patient history, and epidemiological information. The expected result is Negative. Fact Sheet for Patients:  https://www.moore.com/ Fact Sheet for Healthcare Providers:  https://www.young.biz/ This test is not yet ap proved or cleared by the [place] FDA  and  has been authorized for detection and/or diagnosis of SARS-CoV-2 by FDA under an Emergency Use Authorization (EUA). This EUA will remain  in effect (meaning this test can be used) for the duration of the COVID-19 declaration under Section 564(b)(1) of the Act, 21 U.S.C. section 360bbb-3(b)(1), unless the authorization is terminated or revoked sooner.    Influenza A by PCR NEGATIVE NEGATIVE   Influenza B by PCR NEGATIVE NEGATIVE    Comment: (NOTE) The Xpert Xpress SARS-CoV-2/FLU/RSV assay is intended as an aid in  the diagnosis of influenza from Nasopharyngeal swab specimens and  should not be used as a sole basis for treatment. Nasal washings and  aspirates are unacceptable for Xpert Xpress SARS-CoV-2/FLU/RSV  testing. Fact Sheet for Patients: https://www.moore.com/ Fact Sheet for Healthcare Providers: https://www.young.biz/ This test is not yet approved or cleared by the [place] FDA and  has been authorized for detection and/or diagnosis of SARS-CoV-2 by  FDA under an Emergency Use Authorization (EUA). This EUA will remain  in effect (meaning this test can be used) for the duration of the  Covid-19 declaration under Section 564(b)(1) of the Act, 21  U.S.C. section 360bbb-3(b)(1), unless the authorization is  terminated or revoked. Performed at [facility], [address] [place], [place] [address]   Culture, blood (routine x 2)     Status: Abnormal (Preliminary result)   Collection Time: 10/18/19  2:41 AM   Specimen: BLOOD  Result Value Ref Range   Specimen Description      BLOOD RIGHT FOREARM Performed at [facility], [address] [place], [place] [address]    Special Requests      BOTTLES DRAWN AEROBIC AND ANAEROBIC Blood Culture adequate volume Performed at [facility], [address] [place], [place] [address]    Culture  Setup Time      Organism ID to follow GRAM POSITIVE COCCI IN BOTH AEROBIC AND ANAEROBIC BOTTLES CRITICAL RESULT CALLED TO, READ BACK BY AND VERIFIED WITH:  ASAJAH DUNCAN ON 10/18/2019 AT 2051 TIK Performed at [facility], [address], [place], [place] [address]    Culture STAPHYLOCOCCUS HAEMOLYTICUS (A)    Report Status PENDING   Culture, blood (routine x 2)     Status: Abnormal (Preliminary result)   Collection Time: 10/18/19  2:41 AM   Specimen: BLOOD  Result Value Ref Range   Specimen Description      BLOOD LEFT ANTECUBITAL Performed at [facility], [address], [place], [place] [address]    Special Requests      BOTTLES DRAWN AEROBIC AND ANAEROBIC Blood Culture adequate volume Performed at [facility], [address] [place], [place] [address]    Culture  Setup Time      GRAM POSITIVE COCCI ANAEROBIC BOTTLE ONLY CRITICAL RESULT CALLED TO, READ BACK BY AND VERIFIED WITH: [facility] DUNCAN AT 1742 10/18/19.PMF Performed at [facility], [address] [place], [place] [address]    Culture (A)     STAPHYLOCOCCUS HAEMOLYTICUS SUSCEPTIBILITIES TO FOLLOW Performed at [facility], [address], [place], [place] [address]    Report Status PENDING   Lactic acid, plasma     Status: Abnormal   Collection Time: 10/18/19  2:41 AM  Result Value Ref Range   Lactic Acid, Venous 2.5 (HH) 0.5 - 1.9 mmol/L    Comment: CRITICAL RESULT  CALLED TO, READ BACK BY AND VERIFIED WITH VANESSA ASHELEY ON 10/18/19 AT 0349 [facility] Performed  at [facility], [address] [place], [place] [address]   Pregnancy, urine     Status: None   Collection Time: 10/18/19  2:41 AM  Result Value Ref Range   Preg Test, Ur NEGATIVE NEGATIVE    Comment: Performed at [facility], [address] [place], [place] [address]  Blood Culture ID Panel (Reflexed)     Status: Abnormal   Collection Time: 10/18/19  2:41 AM  Result Value Ref Range   Enterococcus species NOT DETECTED NOT DETECTED   Listeria monocytogenes NOT DETECTED NOT DETECTED   Staphylococcus species DETECTED (A) NOT DETECTED    Comment: Methicillin (oxacillin) resistant coagulase negative staphylococcus. Possible blood culture contaminant (unless isolated from more than one blood culture draw or clinical case suggests pathogenicity). No antibiotic treatment is indicated for blood  culture contaminants. CRITICAL RESULT CALLED TO, READ BACK BY AND VERIFIED WITH: ASAJAH DUNCAN ON 10/18/2019 AT 2051 TIK    Staphylococcus aureus (BCID) NOT DETECTED NOT DETECTED   Methicillin resistance DETECTED (A) NOT DETECTED    Comment: CRITICAL RESULT CALLED TO, READ BACK BY AND VERIFIED WITH: ASAJAH DUNCAN ON 10/18/2019 AT 2051 TIK    Streptococcus species NOT DETECTED NOT DETECTED   Streptococcus agalactiae NOT DETECTED NOT DETECTED   Streptococcus pneumoniae NOT DETECTED NOT DETECTED   Streptococcus pyogenes NOT DETECTED NOT DETECTED   Acinetobacter baumannii NOT DETECTED NOT DETECTED   Enterobacteriaceae species NOT DETECTED NOT DETECTED   Enterobacter cloacae complex NOT DETECTED NOT DETECTED   Escherichia coli NOT DETECTED NOT DETECTED   Klebsiella oxytoca NOT DETECTED NOT DETECTED   Klebsiella pneumoniae NOT DETECTED NOT DETECTED   Proteus species NOT DETECTED NOT DETECTED   Serratia marcescens NOT DETECTED NOT DETECTED   Haemophilus influenzae NOT DETECTED NOT DETECTED    Neisseria meningitidis NOT DETECTED NOT DETECTED   Pseudomonas aeruginosa NOT DETECTED NOT DETECTED   Candida albicans NOT DETECTED NOT DETECTED   Candida glabrata NOT DETECTED NOT DETECTED   Candida krusei NOT DETECTED NOT DETECTED   Candida parapsilosis NOT DETECTED NOT DETECTED   Candida tropicalis NOT DETECTED NOT DETECTED    Comment: Performed at [facility], [address] [place], [place] [address]  Lactic acid, plasma     Status: Abnormal   Collection Time: 10/18/19  4:21 AM  Result Value Ref Range   Lactic Acid, Venous 2.0 (HH) 0.5 - 1.9 mmol/L    Comment: CRITICAL VALUE NOTED. VALUE IS CONSISTENT WITH PREVIOUSLY REPORTED/CALLED VALUE [facility] Performed at [facility], [address] [place], [place] [address]   CBC with Differential     Status: Abnormal   Collection Time: 10/18/19  4:21 AM  Result Value Ref Range   WBC 12.1 (H) 4.0 - 10.5 K/uL   RBC 3.68 (L) 3.87 - 5.11 MIL/uL   Hemoglobin 11.5 (L) 12.0 - 15.0 g/dL   HCT 52.834.4 (L) 41.336.0 - 24.446.0 %   MCV 93.5 80.0 - 100.0 fL   MCH 31.3 26.0 - 34.0 pg   MCHC 33.4 30.0 - 36.0 g/dL   RDW 01.013.1 27.211.5 - 53.615.5 %   Platelets 237 150 - 400 K/uL   nRBC 0.0 0.0 - 0.2 %   Neutrophils Relative % 88 %   Neutro Abs 10.6 (H) 1.7 - 7.7 K/uL   Lymphocytes Relative 9 %   Lymphs Abs 1.0 0.7 - 4.0 K/uL   Monocytes Relative 3 %   Monocytes Absolute 0.4 0.1 - 1.0 K/uL   Eosinophils Relative 0 %  Eosinophils Absolute 0.0 0.0 - 0.5 K/uL   Basophils Relative 0 %   Basophils Absolute 0.0 0.0 - 0.1 K/uL   Immature Granulocytes 0 %   Abs Immature Granulocytes 0.03 0.00 - 0.07 K/uL    Comment: Performed at [facility], [address] [place], [place] [address]  Protime-INR     Status: None   Collection Time: 10/18/19  4:21 AM  Result Value Ref Range   Prothrombin Time 13.9 11.4 - 15.2 seconds   INR 1.1 0.8 - 1.2    Comment: (NOTE) INR goal varies based on device and disease states. Performed at [facility], [address] [place], [place] [address]   APTT     Status: None   Collection Time: 10/18/19  4:21 AM  Result Value Ref Range   aPTT 30 24 - 36 seconds    Comment: Performed at [facility], [address] [place], [place] [address]  MRSA PCR Screening     Status: None   Collection Time: 10/18/19  6:44 PM   Specimen: Nasopharyngeal  Result Value Ref Range   MRSA by PCR NEGATIVE NEGATIVE    Comment:        The GeneXpert MRSA Assay (FDA approved for NASAL specimens only), is one component of a comprehensive MRSA colonization surveillance program. It is not intended to diagnose MRSA infection nor to guide or monitor treatment for MRSA infections. Performed at [facility], [address] [place], [place] [address]   Basic metabolic panel     Status: Abnormal   Collection Time: 10/19/19  5:37 AM  Result Value Ref Range   Sodium 137 135 - 145 mmol/L   Potassium 3.6 3.5 - 5.1 mmol/L   Chloride 103 98 - 111 mmol/L   CO2 24 22 - 32 mmol/L   Glucose, Bld 90 70 - 99 mg/dL    Comment: Glucose reference range applies only to samples taken after fasting for at least 8 hours.   BUN 9 6 - 20 mg/dL   Creatinine, Ser 6.94 (H) 0.44 - 1.00 mg/dL   Calcium 8.1 (L) 8.9 - 10.3 mg/dL   GFR calc non Af Amer >60 >60 mL/min   GFR calc Af Amer >60 >60 mL/min   Anion gap 10 5 - 15    Comment: Performed at [facility], [address] [place], [place] [address]  CBC     Status: Abnormal   Collection Time: 10/19/19  5:37 AM  Result Value Ref Range   WBC 12.1 (H) 4.0 - 10.5 K/uL   RBC 3.71 (L) 3.87 - 5.11 MIL/uL   Hemoglobin 11.4 (L) 12.0 - 15.0 g/dL   HCT 70.3 (L) 50.0 - 93.8 %   MCV 94.6 80.0 - 100.0 fL   MCH 30.7 26.0 - 34.0 pg   MCHC 32.5 30.0 - 36.0 g/dL   RDW 18.2 99.3 - 71.6 %   Platelets 224 150 - 400 K/uL   nRBC 0.0 0.0 - 0.2 %    Comment: Performed at [facility], [address] [place], [place] [address]    CT ABDOMEN  PELVIS W CONTRAST  Result Date: 10/18/2019 CLINICAL DATA:  Abdominal distension EXAM: CT ABDOMEN AND PELVIS WITH CONTRAST TECHNIQUE: Multidetector CT imaging of the abdomen and pelvis was performed using the standard protocol following bolus administration of intravenous contrast. CONTRAST:  OMNIPAQUE IOHEXOL 300 MG/ML  SOLN COMPARISON:  09/29/2019 FINDINGS: LOWER CHEST: Normal. HEPATOBILIARY: Normal hepatic contours. No  intra- or extrahepatic biliary dilatation. Status post cholecystectomy. PANCREAS: Normal pancreas. No ductal dilatation or peripancreatic fluid collection. SPLEEN: Normal. ADRENALS/URINARY TRACT: The adrenal glands are normal. No hydronephrosis, nephroureterolithiasis or solid renal mass. The urinary bladder is normal for degree of distention STOMACH/BOWEL: There is a small amounts of gas again seen in the left mid abdomen at the site of the now removed percutaneous drain. There is mild stranding surrounding the left upper quadrant small bowel and the junction of the sigmoid and descending colon. The degree of inflammatory changes greater than on the prior study. There is no discrete fluid collection. VASCULAR/LYMPHATIC: Normal course and caliber of the major abdominal vessels. No abdominal or pelvic lymphadenopathy. REPRODUCTIVE: Multiple uterine fibroids. MUSCULOSKELETAL. No bony spinal canal stenosis or focal osseous abnormality. OTHER: None. IMPRESSION: Increased inflammatory change surrounding the left upper quadrant small bowel and the junction of the sigmoid and descending colon, likely indicating worsening/recurrent diverticulitis. Small amounts of free air in the left mid abdomen may have been introduced by the drainage catheter, but recurrent perforation could also cause this appearance. Electronically Signed   By: Deatra Robinson M.D.   On: 10/18/2019 01:33    Review of Systems  All other systems reviewed and are negative. Or as discussed in the history of present  illness. Blood pressure 108/78, pulse 85, temperature 99.4 F (37.4 C), temperature source Oral, resp. rate 18, height 4\' 11"  (1.499 m), weight 68.9 kg, last menstrual period 10/15/2019, SpO2 96 %. Physical Exam  Constitutional: She is oriented to person, place, and time. She appears well-developed and well-nourished.  Appears uncomfortable.  HENT:  Head: Normocephalic and atraumatic.  Nose: Nose normal.  Eyes: Right eye exhibits no discharge. Left eye exhibits no discharge. No scleral icterus.  Neck: No tracheal deviation present.  Cardiovascular: Normal rate and regular rhythm.  Respiratory: Effort normal. No stridor. No respiratory distress.  GI: Soft. There is abdominal tenderness. There is no rebound and no guarding.  She is tender to palpation basically everywhere from the umbilicus to the pubis.  No rebound, guarding, or other peritoneal signs.  Genitourinary:    Genitourinary Comments: Deferred   Musculoskeletal:        General: No deformity or edema.  Neurological: She is alert and oriented to person, place, and time.  Skin: Skin is warm and dry.  Psychiatric: She has a normal mood and affect. Her behavior is normal.   Her physical exam is completely unchanged from yesterday.  Assessment/Plan: This is a 44 year old woman who had complicated diverticulitis.  She was treated with antibiotics and abscess drainage.  Since her last drain was removed and she completed her course of antibiotics, she has had increased abdominal pain, worsening to the point that she presented to the emergency department last night.  CT scan shows worsening diverticulitis.  She has been admitted to hospital medicine.  -N.p.o. -IV fluids -IV antibiotics -Monitor lactic acid, leukocytosis, and abdominal exam. -General surgery will continue to follow.  Dr. 54 will return on Monday.   Thursday 10/19/2019, 3:27 PM

## 2019-10-19 NOTE — Progress Notes (Signed)
Report was given to step down ICU nurse and patient was transferred over with all belongings.

## 2019-10-19 NOTE — Progress Notes (Signed)
At 1530 patient's V/S:  B/P 128/94 (105) HR 103 RR 18 Temp 99.8 O2 Sat 94%  RUQ is slightly distended compared to the left side, she states it feels full, bowel sounds are more hyperactive in that particular area compared to this morning.  Dr. Sherryll Burger notified and has ordered for patient to be transferred to step down ICU.

## 2019-10-19 NOTE — Plan of Care (Signed)
Continuing with plan of care. 

## 2019-10-20 DIAGNOSIS — W57XXXD Bitten or stung by nonvenomous insect and other nonvenomous arthropods, subsequent encounter: Secondary | ICD-10-CM

## 2019-10-20 LAB — GLUCOSE, CAPILLARY
Glucose-Capillary: 106 mg/dL — ABNORMAL HIGH (ref 70–99)
Glucose-Capillary: 122 mg/dL — ABNORMAL HIGH (ref 70–99)
Glucose-Capillary: 131 mg/dL — ABNORMAL HIGH (ref 70–99)
Glucose-Capillary: 131 mg/dL — ABNORMAL HIGH (ref 70–99)
Glucose-Capillary: 92 mg/dL (ref 70–99)
Glucose-Capillary: 94 mg/dL (ref 70–99)

## 2019-10-20 LAB — BASIC METABOLIC PANEL
Anion gap: 7 (ref 5–15)
BUN: 7 mg/dL (ref 6–20)
CO2: 28 mmol/L (ref 22–32)
Calcium: 7.9 mg/dL — ABNORMAL LOW (ref 8.9–10.3)
Chloride: 104 mmol/L (ref 98–111)
Creatinine, Ser: 1.2 mg/dL — ABNORMAL HIGH (ref 0.44–1.00)
GFR calc Af Amer: >60 mL/min (ref >60)
GFR calc non Af Amer: 55 mL/min — ABNORMAL LOW (ref >60)
Glucose, Bld: 114 mg/dL — ABNORMAL HIGH (ref 70–99)
Potassium: 3.3 mmol/L — ABNORMAL LOW (ref 3.5–5.1)
Sodium: 139 mmol/L (ref 135–145)

## 2019-10-20 LAB — CBC
HCT: 30.6 % — ABNORMAL LOW (ref 36.0–46.0)
Hemoglobin: 9.8 g/dL — ABNORMAL LOW (ref 12.0–15.0)
MCH: 30.4 pg (ref 26.0–34.0)
MCHC: 32 g/dL (ref 30.0–36.0)
MCV: 95 fL (ref 80.0–100.0)
Platelets: 217 10*3/uL (ref 150–400)
RBC: 3.22 MIL/uL — ABNORMAL LOW (ref 3.87–5.11)
RDW: 13 % (ref 11.5–15.5)
WBC: 9.5 10*3/uL (ref 4.0–10.5)
nRBC: 0 % (ref 0.0–0.2)

## 2019-10-20 MED ORDER — SODIUM CHLORIDE 0.9 % IV SOLN
100.0000 mg | Freq: Two times a day (BID) | INTRAVENOUS | Status: DC
  Administered 2019-10-20 – 2019-10-23 (×6): 100 mg via INTRAVENOUS
  Filled 2019-10-20 (×9): qty 100

## 2019-10-20 MED ORDER — POTASSIUM CHLORIDE CRYS ER 20 MEQ PO TBCR
40.0000 meq | EXTENDED_RELEASE_TABLET | Freq: Once | ORAL | Status: DC
  Filled 2019-10-20: qty 2

## 2019-10-20 MED ORDER — POTASSIUM CHLORIDE 10 MEQ/100ML IV SOLN
10.0000 meq | INTRAVENOUS | Status: DC
  Administered 2019-10-20: 10 meq via INTRAVENOUS
  Filled 2019-10-20 (×4): qty 100

## 2019-10-20 NOTE — Progress Notes (Signed)
Pharmacy Antibiotic Note  Sara Chapman is a 44 y.o. female admitted on 10/18/2019 with bacteremia. Patient with recent history of complicated diverticulitis which was treated with antibiotics and abscess drainage. Pharmacy has been consulted for Vancomycin dosing. Patient is also on pip/tazo.   WBC down-trending, afebrile x24h. ID has been consulted.  Plan: Vancomycin 1000 mg IV Q 24 hrs. Goal AUC 400-550. Expected AUC: 509.5, Css 13.5 SCr used: 1.20  Daily Scr per protocol. Levels at steady state as indicated.  Height: 4\' 11"  (149.9 cm) Weight: 158 lb 8.2 oz (71.9 kg) IBW/kg (Calculated) : 43.2  Temp (24hrs), Avg:99.6 F (37.6 C), Min:98.8 F (37.1 C), Max:100.1 F (37.8 C)  Recent Labs  Lab 10/17/19 2347 10/18/19 0241 10/18/19 0421 10/19/19 0537 10/20/19 0240  WBC 12.0*  --  12.1* 12.1* 9.5  CREATININE 1.05*  --   --  1.04* 1.20*  LATICACIDVEN  --  2.5* 2.0*  --   --     Estimated Creatinine Clearance: 51.1 mL/min (A) (by C-G formula based on SCr of 1.2 mg/dL (H)).    Allergies  Allergen Reactions  . Clonazepam Other (See Comments)    SUICIDAL THOUGHTS SUICIDAL THOUGHTS SUICIDAL THOUGHTS   . Ibuprofen Other (See Comments)    stomach ulcers stomach ulcers stomach ulcers stomach ulcers   . Nsaids Other (See Comments)    GI ulcers  . Pseudoephedrine Other (See Comments)    palpitations palpitations   . Tolmetin Other (See Comments)    GI ulcers GI ulcers   . Pseudoephedrine Hcl Other (See Comments) and Palpitations    Unable to tolerate Unable to tolerate   . Tape Rash    Paper tape breaks down the skin and causes sores    Antimicrobials this admission:  Pip/tazo 3/20 >>   Vancomycin 3/21 >>   Dose adjustments this admission: n/a  Microbiology results:  3/20 BCx: 3/4 bottles Staphylococcus haemolyticus  3/20 MRSA PCR: negative  Thank you for allowing pharmacy to be a part of this patient's care.  4/20  Pharmacy  Resident 10/20/2019 12:52 PM

## 2019-10-20 NOTE — Consult Note (Signed)
Infectious Disease     Reason for Consult: Bacteremia   Referring Physician: Dr Manuella Ghazi Date of Admission:  10/18/2019   Principal Problem:   Sepsis [facility]) Active Problems:   Acute diverticulitis   HPI: Sara Chapman is a 43 y.o. female 44 y.o. female with medical history significant for recurrent diverticulitis, recently hospitalized under the surgical service for treatment of diverticulitis with perforation and abscess, treated conservatively with percutaneous drains, removed on 09/29/2019 who presents to the emergency room with sudden onset left lower quadrant pain associated with one episode of emesis. On admit wbc 12, LA 2.5, tachy and hypertensive.   Seen by surgery and plan for NPO and IV abx. Clinical some improvement, no fevers, wbc down. She had finished otpt oral cipro and flagyl about a week ago.  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 GS with GNR and GV rods  CT scan  Increased inflammatory change surrounding the left upper quadrant small bowel and the junction of the sigmoid and descending colon, likely indicating worsening/recurrent diverticulitis. Small amounts of free air in the left mid abdomen may have been introduced by the drainage catheter, but recurrent perforation could also cause this appearance.  Past Medical History:  Diagnosis Date  . Bowel perforation ([place])   . Psoriatic arthritis ([place])   . Stomach ulcer    History reviewed. No pertinent surgical history. Social History   Tobacco Use  . Smoking status: Never Smoker  . Smokeless tobacco: Never Used  Substance Use Topics  . Alcohol use: Never  . Drug use: Never   History reviewed. No pertinent family history.  Allergies:  Allergies  Allergen Reactions  . Clonazepam Other (See Comments)    SUICIDAL THOUGHTS SUICIDAL THOUGHTS SUICIDAL THOUGHTS   . Ibuprofen Other (See Comments)    stomach ulcers stomach ulcers stomach  ulcers stomach ulcers   . Nsaids Other (See Comments)    GI ulcers  . Pseudoephedrine Other (See Comments)    palpitations palpitations   . Tolmetin Other (See Comments)    GI ulcers GI ulcers   . Pseudoephedrine Hcl Other (See Comments) and Palpitations    Unable to tolerate Unable to tolerate   . Tape Rash    Paper tape breaks down the skin and causes sores    Current antibiotics: Antibiotics Given (last 72 hours)    Date/Time Action Medication Dose Rate   10/18/19 0300 New Bag/Given   piperacillin-tazobactam (ZOSYN) IVPB 3.375 g 3.375 g 100 mL/hr   10/18/19 1326 New Bag/Given   piperacillin-tazobactam (ZOSYN) IVPB 3.375 g 3.375 g 12.5 mL/hr   10/18/19 2106 New Bag/Given   piperacillin-tazobactam (ZOSYN) IVPB 3.375 g 3.375 g 12.5 mL/hr   10/19/19 0052 New Bag/Given  [Needed IV access]   vancomycin (VANCOREADY) IVPB 1750 mg/350 mL 1,750 mg 175 mL/hr   10/19/19 0451 New Bag/Given   piperacillin-tazobactam (ZOSYN) IVPB 3.375 g 3.375 g 12.5 mL/hr   10/19/19 1340 New Bag/Given   piperacillin-tazobactam (ZOSYN) IVPB 3.375 g 3.375 g 12.5 mL/hr   10/19/19 2224 New Bag/Given   piperacillin-tazobactam (ZOSYN) IVPB 3.375 g 3.375 g 12.5 mL/hr   10/20/19 0240 New Bag/Given   vancomycin (VANCOCIN) IVPB 1000 mg/200 mL premix 1,000 mg 200 mL/hr   10/20/19 0519 New Bag/Given   piperacillin-tazobactam (ZOSYN) IVPB 3.375 g 3.375 g 12.5 mL/hr      MEDICATIONS: . Chlorhexidine Gluconate Cloth  6 each Topical Daily  . potassium chloride  40 mEq Oral Once    Review of Systems - 11 systems reviewed and negative per HPI   OBJECTIVE: Temp:  [98.8 F (37.1 C)-100.1 F (37.8 C)] 98.8 F (37.1 C) (03/22 0800) Pulse Rate:  [87-111] 91 (03/22 0800) Resp:  [10-36] 15 (03/22 0800) BP: (113-144)/(72-102) 125/86 (03/22 0800) SpO2:  [91 %-100 %] 99 % (03/22 0800) Weight:  [71.9 kg] 71.9 kg (03/21 1700) Physical Exam  Constitutional:  oriented to person, place, and time. appears  well-developed and well-nourished. Mod ill appearing, HENT: [place]/AT, PERRLA, no scleral icterus Mouth/Throat: Oropharynx is clear and moist. No oropharyngeal exudate.  Cardiovascular: Normal rate, regular rhythm and normal heart sounds.  Pulmonary/Chest: Effort normal and breath sounds normal. No respiratory distress.  has no wheezes.  Neck = supple, no nuchal rigidity Abdominal: Soft.+ TTP diffusely Lymphadenopathy: no cervical adenopathy. No axillary adenopathy Neurological: alert and oriented to person, place, and time.  Skin: Skin is warm and dry. No rash noted. No erythema.  Psychiatric: a normal mood and affect.  behavior is normal.    LABS: Results for orders placed or performed during the hospital encounter of 10/18/19 (from the past 48 hour(s))  MRSA PCR Screening     Status: None   Collection Time: 10/18/19  6:44 PM   Specimen: Nasopharyngeal  Result Value Ref Range   MRSA by PCR NEGATIVE NEGATIVE    Comment:        The GeneXpert MRSA Assay (FDA approved for NASAL specimens only), is one component of a comprehensive MRSA colonization surveillance program. It is not intended to diagnose MRSA infection nor to guide or monitor treatment for MRSA infections. Performed at [facility], [address] [place], [place] [address]   Basic metabolic panel     Status: Abnormal   Collection Time: 10/19/19  5:37 AM  Result Value Ref Range   Sodium 137 135 - 145 mmol/L   Potassium 3.6 3.5 - 5.1 mmol/L   Chloride 103 98 - 111 mmol/L   CO2 24 22 - 32 mmol/L   Glucose, Bld 90 70 - 99 mg/dL    Comment: Glucose reference range applies only to samples taken after fasting for at least 8 hours.   BUN 9 6 - 20 mg/dL   Creatinine, Ser 1.04 (H) 0.44 - 1.00 mg/dL   Calcium 8.1 (L) 8.9 - 10.3 mg/dL   GFR calc non Af Amer >60 >60 mL/min   GFR calc Af Amer >60 >60 mL/min   Anion gap 10 5 - 15    Comment: Performed at [facility], [address] [place], [place]  [address]  CBC     Status: Abnormal   Collection Time: 10/19/19  5:37 AM  Result Value Ref Range   WBC 12.1 (H) 4.0 - 10.5 K/uL   RBC 3.71 (L) 3.87 - 5.11 MIL/uL   Hemoglobin 11.4 (L) 12.0 - 15.0 g/dL   HCT 35.1 (L) 36.0 - 46.0 %   MCV 94.6 80.0 - 100.0 fL   MCH 30.7 26.0 - 34.0 pg   MCHC 32.5 30.0 - 36.0 g/dL   RDW 13.1 11.5 - 15.5 %   Platelets 224 150 - 400 K/uL   nRBC 0.0 0.0 - 0.2 %    Comment: Performed at [facility], [address] [place],  [address]  Glucose, capillary     Status: Abnormal   Collection Time: 10/19/19  5:04 PM  Result Value Ref Range   Glucose-Capillary 68 (L) 70 -  99 mg/dL    Comment: Glucose reference range applies only to samples taken after fasting for at least 8 hours.  Glucose, capillary     Status: None   Collection Time: 10/19/19  7:43 PM  Result Value Ref Range   Glucose-Capillary 83 70 - 99 mg/dL    Comment: Glucose reference range applies only to samples taken after fasting for at least 8 hours.  Glucose, capillary     Status: None   Collection Time: 10/20/19 12:04 AM  Result Value Ref Range   Glucose-Capillary 92 70 - 99 mg/dL    Comment: Glucose reference range applies only to samples taken after fasting for at least 8 hours.  CBC     Status: Abnormal   Collection Time: 10/20/19  2:40 AM  Result Value Ref Range   WBC 9.5 4.0 - 10.5 K/uL   RBC 3.22 (L) 3.87 - 5.11 MIL/uL   Hemoglobin 9.8 (L) 12.0 - 15.0 g/dL   HCT 30.6 (L) 36.0 - 46.0 %   MCV 95.0 80.0 - 100.0 fL   MCH 30.4 26.0 - 34.0 pg   MCHC 32.0 30.0 - 36.0 g/dL   RDW 13.0 11.5 - 15.5 %   Platelets 217 150 - 400 K/uL   nRBC 0.0 0.0 - 0.2 %    Comment: Performed at [facility], [address] [place], [place] [address]  Basic metabolic panel     Status: Abnormal   Collection Time: 10/20/19  2:40 AM  Result Value Ref Range   Sodium 139 135 - 145 mmol/L   Potassium 3.3 (L) 3.5 - 5.1 mmol/L   Chloride 104 98 - 111 mmol/L   CO2 28 22 - 32 mmol/L    Glucose, Bld 114 (H) 70 - 99 mg/dL    Comment: Glucose reference range applies only to samples taken after fasting for at least 8 hours.   BUN 7 6 - 20 mg/dL   Creatinine, Ser 1.20 (H) 0.44 - 1.00 mg/dL   Calcium 7.9 (L) 8.9 - 10.3 mg/dL   GFR calc non Af Amer 55 (L) >60 mL/min   GFR calc Af Amer >60 >60 mL/min   Anion gap 7 5 - 15    Comment: Performed at [facility], [address] [place], [place] [address]  Glucose, capillary     Status: Abnormal   Collection Time: 10/20/19  4:14 AM  Result Value Ref Range   Glucose-Capillary 131 (H) 70 - 99 mg/dL    Comment: Glucose reference range applies only to samples taken after fasting for at least 8 hours.  Glucose, capillary     Status: Abnormal   Collection Time: 10/20/19  7:39 AM  Result Value Ref Range   Glucose-Capillary 122 (H) 70 - 99 mg/dL    Comment: Glucose reference range applies only to samples taken after fasting for at least 8 hours.  Glucose, capillary     Status: Abnormal   Collection Time: 10/20/19 11:20 AM  Result Value Ref Range   Glucose-Capillary 131 (H) 70 - 99 mg/dL    Comment: Glucose reference range applies only to samples taken after fasting for at least 8 hours.   No components found for: ESR, C REACTIVE PROTEIN MICRO: Recent Results (from the past 720 hour(s))  Respiratory Panel by RT PCR (Flu A&B, Covid) -     Status: None   Collection Time: 10/18/19  2:41 AM  Result Value Ref Range Status   SARS Coronavirus 2 by RT PCR  NEGATIVE NEGATIVE Final    Comment: (NOTE) SARS-CoV-2 target nucleic acids are NOT DETECTED. The SARS-CoV-2 RNA is generally detectable in upper respiratoy specimens during the acute phase of infection. The lowest concentration of SARS-CoV-2 viral copies this assay can detect is 131 copies/mL. A negative result does not preclude SARS-Cov-2 infection and should not be used as the sole basis for treatment or other patient management decisions. A negative result may occur with   improper specimen collection/handling, submission of specimen other than nasopharyngeal swab, presence of viral mutation(s) within the areas targeted by this assay, and inadequate number of viral copies (<131 copies/mL). A negative result must be combined with clinical observations, patient history, and epidemiological information. The expected result is Negative. Fact Sheet for Patients:  PinkCheek.be Fact Sheet for Healthcare Providers:  GravelBags.it This test is not yet ap proved or cleared by the [place] FDA and  has been authorized for detection and/or diagnosis of SARS-CoV-2 by FDA under an Emergency Use Authorization (EUA). This EUA will remain  in effect (meaning this test can be used) for the duration of the COVID-19 declaration under Section 564(b)(1) of the Act, 21 U.S.C. section 360bbb-3(b)(1), unless the authorization is terminated or revoked sooner.    Influenza A by PCR NEGATIVE NEGATIVE Final   Influenza B by PCR NEGATIVE NEGATIVE Final    Comment: (NOTE) The Xpert Xpress SARS-CoV-2/FLU/RSV assay is intended as an aid in  the diagnosis of influenza from Nasopharyngeal swab specimens and  should not be used as a sole basis for treatment. Nasal washings and  aspirates are unacceptable for Xpert Xpress SARS-CoV-2/FLU/RSV  testing. Fact Sheet for Patients: PinkCheek.be Fact Sheet for Healthcare Providers: GravelBags.it This test is not yet approved or cleared by the [place] FDA and  has been authorized for detection and/or diagnosis of SARS-CoV-2 by  FDA under an Emergency Use Authorization (EUA). This EUA will remain  in effect (meaning this test can be used) for the duration of the  Covid-19 declaration under Section 564(b)(1) of the Act, 21  U.S.C. section 360bbb-3(b)(1), unless the authorization is  terminated or revoked. Performed at  [facility], [address] [place], [place] [address]   Culture, blood (routine x 2)     Status: Abnormal (Preliminary result)   Collection Time: 10/18/19  2:41 AM   Specimen: BLOOD  Result Value Ref Range Status   Specimen Description   Final    BLOOD RIGHT FOREARM Performed at [facility], [address] [place], [place] [address]    Special Requests   Final    BOTTLES DRAWN AEROBIC AND ANAEROBIC Blood Culture adequate volume Performed at [facility], [address] [place], [place] [address]    Culture  Setup Time   Final    Organism ID to follow GRAM POSITIVE COCCI IN BOTH AEROBIC AND ANAEROBIC BOTTLES CRITICAL RESULT CALLED TO, READ BACK BY AND VERIFIED WITH:  ASAJAH DUNCAN ON 10/18/2019 AT 2051 TIK    Culture (A)  Final    STAPHYLOCOCCUS HAEMOLYTICUS SUSCEPTIBILITIES TO FOLLOW Performed at [facility], [place] [address], [place], [place] [address]    Report Status PENDING  Incomplete  Culture, blood (routine x 2)     Status: Abnormal (Preliminary result)   Collection Time: 10/18/19  2:41 AM   Specimen: BLOOD  Result Value Ref Range Status   Specimen Description   Final    BLOOD LEFT ANTECUBITAL Performed at [facility], [place],  [place] [address]    Special Requests   Final    BOTTLES DRAWN AEROBIC AND ANAEROBIC Blood Culture adequate volume Performed at [facility], [address] [place], [place] [address]    Culture  Setup Time   Final    GRAM POSITIVE COCCI ANAEROBIC BOTTLE ONLY CRITICAL RESULT CALLED TO, READ BACK BY AND VERIFIED WITH: [facility] DUNCAN AT 1742 10/18/19.PMF Performed at [facility], [address] [place], [place] [address]    Culture (A)  Final    STAPHYLOCOCCUS HAEMOLYTICUS SUSCEPTIBILITIES TO FOLLOW Performed at [facility], [place] [address] [place], [place] [address]    Report Status PENDING  Incomplete  Blood Culture ID Panel (Reflexed)      Status: Abnormal   Collection Time: 10/18/19  2:41 AM  Result Value Ref Range Status   Enterococcus species NOT DETECTED NOT DETECTED Final   Listeria monocytogenes NOT DETECTED NOT DETECTED Final   Staphylococcus species DETECTED (A) NOT DETECTED Final    Comment: Methicillin (oxacillin) resistant coagulase negative staphylococcus. Possible blood culture contaminant (unless isolated from more than one blood culture draw or clinical case suggests pathogenicity). No antibiotic treatment is indicated for blood  culture contaminants. CRITICAL RESULT CALLED TO, READ BACK BY AND VERIFIED WITH: ASAJAH DUNCAN ON 10/18/2019 AT 2051 TIK    Staphylococcus aureus (BCID) NOT DETECTED NOT DETECTED Final   Methicillin resistance DETECTED (A) NOT DETECTED Final    Comment: CRITICAL RESULT CALLED TO, READ BACK BY AND VERIFIED WITH: ASAJAH DUNCAN ON 10/18/2019 AT 2051 TIK    Streptococcus species NOT DETECTED NOT DETECTED Final   Streptococcus agalactiae NOT DETECTED NOT DETECTED Final   Streptococcus pneumoniae NOT DETECTED NOT DETECTED Final   Streptococcus pyogenes NOT DETECTED NOT DETECTED Final   Acinetobacter baumannii NOT DETECTED NOT DETECTED Final   Enterobacteriaceae species NOT DETECTED NOT DETECTED Final   Enterobacter cloacae complex NOT DETECTED NOT DETECTED Final   Escherichia coli NOT DETECTED NOT DETECTED Final   Klebsiella oxytoca NOT DETECTED NOT DETECTED Final   Klebsiella pneumoniae NOT DETECTED NOT DETECTED Final   Proteus species NOT DETECTED NOT DETECTED Final   Serratia marcescens NOT DETECTED NOT DETECTED Final   Haemophilus influenzae NOT DETECTED NOT DETECTED Final   Neisseria meningitidis NOT DETECTED NOT DETECTED Final   Pseudomonas aeruginosa NOT DETECTED NOT DETECTED Final   Candida albicans NOT DETECTED NOT DETECTED Final   Candida glabrata NOT DETECTED NOT DETECTED Final   Candida krusei NOT DETECTED NOT DETECTED Final   Candida parapsilosis NOT DETECTED NOT  DETECTED Final   Candida tropicalis NOT DETECTED NOT DETECTED Final    Comment: Performed at [facility], [address] [place], [place] [address]  MRSA PCR Screening     Status: None   Collection Time: 10/18/19  6:44 PM   Specimen: Nasopharyngeal  Result Value Ref Range Status   MRSA by PCR NEGATIVE NEGATIVE Final    Comment:        The GeneXpert MRSA Assay (FDA approved for NASAL specimens only), is one component of a comprehensive MRSA colonization surveillance program. It is not intended to diagnose MRSA infection nor to guide or monitor treatment for MRSA infections. Performed at [facility], [address] [place], [place] [address]     IMAGING: CT ABDOMEN PELVIS W CONTRAST  Result Date: 10/18/2019 CLINICAL DATA:  Abdominal distension EXAM: CT ABDOMEN AND PELVIS WITH CONTRAST TECHNIQUE: Multidetector CT imaging of the abdomen and pelvis was performed using the  standard protocol following bolus administration of intravenous contrast. CONTRAST:  123m OMNIPAQUE IOHEXOL 300 MG/ML  SOLN COMPARISON:  09/29/2019 FINDINGS: LOWER CHEST: Normal. HEPATOBILIARY: Normal hepatic contours. No intra- or extrahepatic biliary dilatation. Status post cholecystectomy. PANCREAS: Normal pancreas. No ductal dilatation or peripancreatic fluid collection. SPLEEN: Normal. ADRENALS/URINARY TRACT: The adrenal glands are normal. No hydronephrosis, nephroureterolithiasis or solid renal mass. The urinary bladder is normal for degree of distention STOMACH/BOWEL: There is a small amounts of gas again seen in the left mid abdomen at the site of the now removed percutaneous drain. There is mild stranding surrounding the left upper quadrant small bowel and the junction of the sigmoid and descending colon. The degree of inflammatory changes greater than on the prior study. There is no discrete fluid collection. VASCULAR/LYMPHATIC: Normal course and caliber of the major abdominal vessels. No  abdominal or pelvic lymphadenopathy. REPRODUCTIVE: Multiple uterine fibroids. MUSCULOSKELETAL. No bony spinal canal stenosis or focal osseous abnormality. OTHER: None. IMPRESSION: Increased inflammatory change surrounding the left upper quadrant small bowel and the junction of the sigmoid and descending colon, likely indicating worsening/recurrent diverticulitis. Small amounts of free air in the left mid abdomen may have been introduced by the drainage catheter, but recurrent perforation could also cause this appearance. Electronically Signed   By: KUlyses JarredM.D.   On: 10/18/2019 01:33   CT Abdomen Pelvis W Contrast  Result Date: 09/29/2019 CLINICAL DATA:  Follow-up diverticulitis and diverticular abscess. Status post percutaneous catheter drainage. EXAM: CT ABDOMEN AND PELVIS WITH CONTRAST TECHNIQUE: Multidetector CT imaging of the abdomen and pelvis was performed using the standard protocol following bolus administration of intravenous contrast. CONTRAST:  1043mOMNIPAQUE IOHEXOL 300 MG/ML  SOLN COMPARISON:  09/19/2019 FINDINGS: Lower Chest: No acute findings. Hepatobiliary: No hepatic masses identified. Gallbladder is unremarkable. No evidence of biliary ductal dilatation. Pancreas:  No mass or inflammatory changes. Spleen: Within normal limits in size and appearance. Adrenals/Urinary Tract: No masses identified. No evidence of ureteral calculi or hydronephrosis. Stomach/Bowel: Mild proximal sigmoid diverticulitis shows no significant change. Percutaneous drainage catheter remains in place and there is no residual fluid collection identified. Vascular/Lymphatic: No pathologically enlarged lymph nodes. No abdominal aortic aneurysm. Reproductive: Small anterior uterine fibroid again seen measuring approximately 2 cm. No adnexal mass or free fluid identified. Other:  None. Musculoskeletal:  No suspicious bone lesions identified. IMPRESSION: 1. Mild proximal sigmoid diverticulitis, without significant change.  Left lower quadrant percutaneous drainage catheter remains in place. No residual abscess or other fluid collections identified. 2. Stable small anterior uterine fibroid. Electronically Signed   By: JoMarlaine Hind.D.   On: 09/29/2019 16:18    Assessment:   TaMonna Creans a [age].o. female female with medical history significant for recurrent diverticulitis, recently hospitalized under the surgical service for treatment of diverticulitis with perforation and abscess, treated conservatively with percutaneous drains, removed on 09/29/2019 and finished oral cipro flagy about a week ago now with recurrent diverticulitis and bcx + 2/2 sets Staph haemolyticus  Recommendations Continue zosyn for intrabdominal infection. Monitor wbc and fever curve. Repeat imaging per surgery guidance. For the bacteremia- I suspect contaminant as no PICC line in place, no skin and soft tissue infections, no UTI sxs and UA neg. However will repeat bcx and cont vanco pending follow up.  Thank you very much for allowing me to participate in the care of this patient. Please call with questions.   DaCheral MarkerFiOla SpurrMD

## 2019-10-20 NOTE — Progress Notes (Signed)
1        [facility] [place] NAME: Sara Chapman    MR#:  [id number]  DATE OF BIRTH:  1976-02-03  SUBJECTIVE:  CHIEF COMPLAINT:   Chief Complaint  Patient presents with  . Abdominal Pain  pain little better, vitals better, afebrile, leukocytosis resolved REVIEW OF SYSTEMS:  Review of Systems  Constitutional: Negative for diaphoresis, fever, malaise/fatigue and weight loss.  HENT: Negative for ear discharge, ear pain, hearing loss, nosebleeds, sore throat and tinnitus.   Eyes: Negative for blurred vision and pain.  Respiratory: Negative for cough, hemoptysis, shortness of breath and wheezing.   Cardiovascular: Negative for chest pain, palpitations, orthopnea and leg swelling.  Gastrointestinal: Positive for abdominal pain and nausea. Negative for blood in stool, constipation, diarrhea, heartburn and vomiting.  Genitourinary: Negative for dysuria, frequency and urgency.  Musculoskeletal: Negative for back pain and myalgias.  Skin: Negative for itching and rash.  Neurological: Negative for dizziness, tingling, tremors, focal weakness, seizures, weakness and headaches.  Psychiatric/Behavioral: Negative for depression. The patient is not nervous/anxious.    DRUG ALLERGIES:   Allergies  Allergen Reactions  . Clonazepam Other (See Comments)    SUICIDAL THOUGHTS SUICIDAL THOUGHTS SUICIDAL THOUGHTS   . Ibuprofen Other (See Comments)    stomach ulcers stomach ulcers stomach ulcers stomach ulcers   . Nsaids Other (See Comments)    GI ulcers  . Pseudoephedrine Other (See Comments)    palpitations palpitations   . Tolmetin Other (See Comments)    GI ulcers GI ulcers   . Pseudoephedrine Hcl Other (See Comments) and Palpitations    Unable to tolerate Unable to tolerate   . Tape Rash    Paper tape breaks down the skin and causes sores   VITALS:  Blood pressure 122/76, pulse 70, temperature 98.8 F (37.1 C), temperature source Oral, resp. rate 14,  height 4\' 11"  (1.499 m), weight 71.9 kg, last menstrual period 10/15/2019, SpO2 100 %. PHYSICAL EXAMINATION:  Physical Exam HENT:     Head: Normocephalic and atraumatic.  Eyes:     Conjunctiva/sclera: Conjunctivae normal.     Pupils: Pupils are equal, round, and reactive to light.  Neck:     Thyroid: No thyromegaly.     Trachea: No tracheal deviation.  Cardiovascular:     Rate and Rhythm: Normal rate and regular rhythm.     Heart sounds: Normal heart sounds.  Pulmonary:     Effort: Pulmonary effort is normal. No respiratory distress.     Breath sounds: Normal breath sounds. No wheezing.  Chest:     Chest wall: No tenderness.  Abdominal:     General: Bowel sounds are normal. There is no distension.     Palpations: Abdomen is soft.     Tenderness: There is generalized abdominal tenderness.  Musculoskeletal:        General: Normal range of motion.     Cervical back: Normal range of motion and neck supple.  Skin:    General: Skin is warm and dry.     Findings: No rash.  Neurological:     Mental Status: She is alert and oriented to person, place, and time.     Cranial Nerves: No cranial nerve deficit.    LABORATORY PANEL:  Female CBC Recent Labs  Lab 10/20/19 0240  WBC 9.5  HGB 9.8*  HCT 30.6*  PLT 217   ------------------------------------------------------------------------------------------------------------------ Chemistries  Recent Labs  Lab 10/17/19 2347 10/19/19 0537 10/20/19 0240  NA 141   < >  139  K 3.8   < > 3.3*  CL 107   < > 104  CO2 26   < > 28  GLUCOSE 120*   < > 114*  BUN 12   < > 7  CREATININE 1.05*   < > 1.20*  CALCIUM 8.5*   < > 7.9*  AST 21  --   --   ALT 18  --   --   ALKPHOS 72  --   --   BILITOT 0.8  --   --    < > = values in this interval not displayed.   RADIOLOGY:  No results found. ASSESSMENT AND PLAN:  44 y.o. female with medical history significant for recurrent diverticulitis, recently hospitalized under the surgical service  for treatment of diverticulitis with perforation and abscess, treated conservatively with percutaneous drains, removed on 09/29/2019 who presents to the emergency room with sudden onset left lower quadrant pain associated with one episode of emesis. Abdominal pain was severe and crampy in left lower quadrant, nonradiating with no alleviating or aggravating factors. Emesis was ingested food stuff.  * Sepsis (HCC) present on admission due to Acute diverticulitis -recurrent in nature -Patient presented with fever, tachycardia, tachypnea, leukocytosis, lactic acid of 2.5 and acute diverticulitis on CT -continue IV Zosyn, vancomycin  -appreciate ID input -IV hydration, IV pain meds -Lactic acid trending down -Surgical input appreciated -no current plans for surgery per Dr. Lady Gary -Pain under better control, no fever or leukocytosis.  *Recent tick bite -A little redness and scab over her back - will add doxy for 5 days per ID  Ok to transfer her to floors   DVT prophylaxis: SCD's Family Communication: NO "discussed with patient" Disposition Plan: Home Barriers to DC - improvement in clinical condition. May need repeat CT per surgery on wed/thursday   All the records are reviewed and case discussed with Care Management/Social Worker. Management plans discussed with the patient, nursing and they are in agreement.  CODE STATUS: Full Code  TOTAL TIME TAKING CARE OF THIS PATIENT: 35 minutes.   More than 50% of the time was spent in counseling/coordination of care: YES  POSSIBLE D/C IN 2-3 DAYS, DEPENDING ON CLINICAL CONDITION.   Delfino Lovett M.D on 10/20/2019 at 4:37 PM  [facility]   CC: Primary care physician; Cathie Hoops, PA  Note: This dictation was prepared with Dragon dictation along with smaller phrase technology. Any transcriptional errors that result from this process are unintentional.

## 2019-10-20 NOTE — Progress Notes (Signed)
Initial Nutrition Assessment  DOCUMENTATION CODES:   Not applicable  INTERVENTION:  Will monitor for diet advancement.  Once diet is advanced provide Ensure Enlive po BID, each supplement provides 350 kcal and 20 grams of protein.  NUTRITION DIAGNOSIS:   Inadequate oral intake related to decreased appetite, acute illness(diverticulitis) as evidenced by per patient/family report.  GOAL:   Patient will meet greater than or equal to 90% of their needs  MONITOR:   Diet advancement, PO intake, Supplement acceptance, Labs, Weight trends, I & O's  REASON FOR ASSESSMENT:   Malnutrition Screening Tool    ASSESSMENT:   44 year old female with PMHx of psoriatic arthritis, stomach ulcer, bowel perforation, complicated acute perforated diverticulitis s/p placement of 2 abscess drains that have now been removed (last removed on 3/2) who is now admitted with recurrent vs unresolved diverticulitis with extraluminal air.   Met with patient at bedside. She is currently NPO and according to chart plan is for her to remain NPO for at least 24-48 hours. Patient reports she had a decreased appetite and intake on and off since early February. She has been struggling with diverticulitis. She reports she was still able to eat soft foods but was eating smaller portions at meals. Towards the end of both previous admissions in February patient's appetite would improve and she would eat 100% of meals and eat well at home before appetite would decrease again. Patient is amenable to drinking ONS to help meet calorie/protein needs once diet is advanced.  Patient reports her UBW was 164 lbs. According to chart patient was 72.9 kg on 09/09/2019. She lost down to 68.4 kg on 09/23/2019. That is a weight loss of 4.5 kg (6.2% body weight) over 2 weeks, which is significant for time frame. Weight has trended back up and patient is now 71.9 kg (158.51 lbs).  Medications reviewed and include: D5 in LR at 100 mL/hr, Zosyn,  vancomycin.  Labs reviewed: CBG 92-131, Potassium 3.3, Creatinine 1.2.  Patient does not meet criteria for malnutrition at this time.  NUTRITION - FOCUSED PHYSICAL EXAM:    Most Recent Value  Orbital Region  No depletion  Upper Arm Region  No depletion  Thoracic and Lumbar Region  No depletion  Buccal Region  No depletion  Temple Region  No depletion  Clavicle Bone Region  No depletion  Clavicle and Acromion Bone Region  No depletion  Scapular Bone Region  No depletion  Dorsal Hand  No depletion  Patellar Region  No depletion  Anterior Thigh Region  No depletion  Posterior Calf Region  No depletion  Edema (RD Assessment)  None  Hair  Reviewed  Eyes  Reviewed  Mouth  Reviewed  Skin  Reviewed  Nails  Reviewed     Diet Order:   Diet Order            Diet NPO time specified  Diet effective now             EDUCATION NEEDS:   Not appropriate for education at this time  Skin:  Skin Assessment: Reviewed RN Assessment  Last BM:  10/17/2019 per chart  Height:   Ht Readings from Last 1 Encounters:  10/19/19 '4\' 11"'$  (1.499 m)   Weight:   Wt Readings from Last 1 Encounters:  10/19/19 71.9 kg   BMI:  Body mass index is 32.02 kg/m.  Estimated Nutritional Needs:   Kcal:  1600-1800  Protein:  85-95 grams  Fluid:  1.8 L/day  Franciszek Platten  Edison Pace, MS, RD, LDN Pager number available on Amion

## 2019-10-20 NOTE — Progress Notes (Signed)
[facility] SURGICAL PROGRESS NOTE (cpt [phone])  Hospital Day(s): 2.   Interval History: Patient seen and examined, transferred to step down unit overnight. Patient reports she continues to have central abdominal soreness and tightness. She believes this is maybe mildly improved from presentation but she had just received pain medications. No nausea or emesis. T-max in last 24 hours is 100.1. Leukocytosis resolved, WBC 9.5K this morning, on Vancomycin and Zosyn. Renal function appears near baseline, sCr - 1.20, making good urine. Mild hypokalemia to 3.3. No new complaints.    Review of Systems:  Constitutional: denies fever, chills  HEENT: denies cough or congestion  Respiratory: denies any shortness of breath  Cardiovascular: denies chest pain or palpitations  Gastrointestinal: + abdominal pain, denied N/V, or diarrhea/and bowel function as per interval history Genitourinary: denies burning with urination or urinary frequency Musculoskeletal: denies pain, decreased motor or sensation  Vital signs in last 24 hours: [min-max] current  Temp:  [99.4 F (37.4 C)-100.1 F (37.8 C)] 99.6 F (37.6 C) (03/22 0200) Pulse Rate:  [85-111] 92 (03/22 0700) Resp:  [10-36] 15 (03/22 0700) BP: (108-144)/(72-102) 144/89 (03/22 0700) SpO2:  [91 %-100 %] 97 % (03/22 0700) Weight:  [71.9 kg] 71.9 kg (03/21 1700)     Height: 4\' 11"  (149.9 cm) Weight: 71.9 kg BMI (Calculated): 32   Intake/Output last 2 shifts:  03/21 0701 - 03/22 0700 In: 2557.2 [I.V.:2402.3; IV Piggyback:154.8] Out: -    Physical Exam:  Constitutional: alert, cooperative and no distress  HENT: normocephalic without obvious abnormality  Eyes: PERRL, EOM's grossly intact and symmetric  Respiratory: breathing non-labored at rest  Cardiovascular: regular rate and sinus rhythm  Gastrointestinal: Soft, tenderness is rather diffuse but seems worse in central abdomen, and non-distended, no rebound/guarding. No overt  peritonitis Musculoskeletal: no edema or wounds, motor and sensation grossly intact, NT    Labs:  CBC Latest Ref Rng & Units 10/20/2019 10/19/2019 10/18/2019  WBC 4.0 - 10.5 K/uL 9.5 12.1(H) 12.1(H)  Hemoglobin 12.0 - 15.0 g/dL 10/20/2019) 11.4(L) 11.5(L)  Hematocrit 36.0 - 46.0 % 30.6(L) 35.1(L) 34.4(L)  Platelets 150 - 400 K/uL 217 224 237   CMP Latest Ref Rng & Units 10/20/2019 10/19/2019 10/17/2019  Glucose 70 - 99 mg/dL 10/19/2019) 90 710(G)  BUN 6 - 20 mg/dL 7 9 12   Creatinine 0.44 - 1.00 mg/dL 269(S) ) 8.54(O)  Sodium 135 - 145 mmol/L 139 137 141  Potassium 3.5 - 5.1 mmol/L 3.3(L) 3.6 3.8  Chloride 98 - 111 mmol/L 104 103 107  CO2 22 - 32 mmol/L 28 24 26   Calcium 8.9 - 10.3 mg/dL 7.9(L) 8.1(L) 8.5(L)  Total Protein 6.5 - 8.1 g/dL - - 7.6  Total Bilirubin 0.3 - 1.2 mg/dL - - 0.8  Alkaline Phos 38 - 126 U/L - - 72  AST 15 - 41 U/L - - 21  ALT 0 - 44 U/L - - 18     Imaging studies: No new pertinent imaging studies   Assessment/Plan: (ICD-10's: K57.92) 44 y.o. female with clinical improvement and resolution of leukocytosis attributable to recurrent -vs- unresolved diverticulitis with extraluminal air however this was small and likely from previous percutaneous drain, although perforation is also plausible.     - Recommend continue NPO given persistent abdominal pain  - May consider repeat CT in 24-48 hours pending clinical condition to reassess for worsening extraluminal air, abscess, or worsening inflammation.    - Continue IV Abx (Zosyn + Vancomycin)   - Monitor labs; improving  -  Pain control prn; antiemetics prn  - serial abdominal examination  - I do not think she warrants any emergent surgical intervention; I think it is reasonable to continue conservative management as it is unclear if she ever recovered from her initial episode of diverticulitis  - Mobilization encouraged   - Further management per primary team; I think it is reasonable to transfer beck to med-surg floor    All of the above findings and recommendations were discussed with the patient, and the medical team, and all of patient's questions were answered to her expressed satisfaction.   -- Edison Simon, PA-C  [facility] 10/20/2019, 7:35 AM [phone] M-F: 7am - 4pm

## 2019-10-21 LAB — CBC
HCT: 26.9 % — ABNORMAL LOW (ref 36.0–46.0)
Hemoglobin: 8.8 g/dL — ABNORMAL LOW (ref 12.0–15.0)
MCH: 31.1 pg (ref 26.0–34.0)
MCHC: 32.7 g/dL (ref 30.0–36.0)
MCV: 95.1 fL (ref 80.0–100.0)
Platelets: 198 10*3/uL (ref 150–400)
RBC: 2.83 MIL/uL — ABNORMAL LOW (ref 3.87–5.11)
RDW: 12.7 % (ref 11.5–15.5)
WBC: 4.8 10*3/uL (ref 4.0–10.5)
nRBC: 0 % (ref 0.0–0.2)

## 2019-10-21 LAB — ROCKY MTN SPOTTED FVR ABS PNL(IGG+IGM)
RMSF IgG: NEGATIVE
RMSF IgM: 0.25 index (ref 0.00–0.89)

## 2019-10-21 LAB — GLUCOSE, CAPILLARY
Glucose-Capillary: 101 mg/dL — ABNORMAL HIGH (ref 70–99)
Glucose-Capillary: 104 mg/dL — ABNORMAL HIGH (ref 70–99)
Glucose-Capillary: 107 mg/dL — ABNORMAL HIGH (ref 70–99)
Glucose-Capillary: 73 mg/dL (ref 70–99)
Glucose-Capillary: 84 mg/dL (ref 70–99)
Glucose-Capillary: 99 mg/dL (ref 70–99)

## 2019-10-21 LAB — CULTURE, BLOOD (ROUTINE X 2)
Special Requests: ADEQUATE
Special Requests: ADEQUATE

## 2019-10-21 LAB — BASIC METABOLIC PANEL
Anion gap: 7 (ref 5–15)
BUN: <5 mg/dL — ABNORMAL LOW (ref 6–20)
CO2: 28 mmol/L (ref 22–32)
Calcium: 8 mg/dL — ABNORMAL LOW (ref 8.9–10.3)
Chloride: 108 mmol/L (ref 98–111)
Creatinine, Ser: 1.16 mg/dL — ABNORMAL HIGH (ref 0.44–1.00)
GFR calc Af Amer: >60 mL/min (ref >60)
GFR calc non Af Amer: 57 mL/min — ABNORMAL LOW (ref >60)
Glucose, Bld: 108 mg/dL — ABNORMAL HIGH (ref 70–99)
Potassium: 3.3 mmol/L — ABNORMAL LOW (ref 3.5–5.1)
Sodium: 143 mmol/L (ref 135–145)

## 2019-10-21 MED ORDER — PROCHLORPERAZINE EDISYLATE 10 MG/2ML IJ SOLN
10.0000 mg | Freq: Once | INTRAMUSCULAR | Status: AC
  Administered 2019-10-21: 10 mg via INTRAVENOUS
  Filled 2019-10-21 (×2): qty 2

## 2019-10-21 NOTE — Progress Notes (Signed)
[facility] SURGICAL PROGRESS NOTE (cpt [phone])  Hospital Day(s): 3.   Interval History: Patient seen and examined, no acute events or new complaints overnight. Patient reports that she stil has generalized abdominal pain, unable to localize one spo or another. She does not this is mildly improved this morning. Still with nausea. No fever, chills, or emesis. WBC remains WNL at 4 today. She was given CLD overnight but did not eat it. No new complaints.   Review of Systems:  Constitutional: denies fever, chills  HEENT: denies cough or congestion  Respiratory: denies any shortness of breath  Cardiovascular: denies chest pain or palpitations  Gastrointestinal: + abdominal pain (improved), + Nausea, denied Vomiting, \or diarrhea/and bowel function as per interval history Genitourinary: denies burning with urination or urinary frequency   Vital signs in last 24 hours: [min-max] current  Temp:  [98.5 F (36.9 C)-99.1 F (37.3 C)] 98.5 F (36.9 C) (03/23 0434) Pulse Rate:  [65-88] 82 (03/23 0434) Resp:  [8-23] 23 (03/23 0434) BP: (111-141)/(65-113) 131/74 (03/23 0434) SpO2:  [90 %-100 %] 94 % (03/23 0434)     Height: 4\' 11"  (149.9 cm) Weight: 71.9 kg BMI (Calculated): 32   Intake/Output last 2 shifts:  03/22 0701 - 03/23 0700 In: 3263.6 [I.V.:2450; IV Piggyback:813.6] Out: -    Physical Exam:  Constitutional: alert, cooperative and no distress  HENT: normocephalic without obvious abnormality  Eyes: PERRL, EOM's grossly intact and symmetric  Respiratory: breathing non-labored at rest  Cardiovascular: regular rate and sinus rhythm  Gastrointestinal: Soft, fairly generalized tenderness, and non-distended, no rebound/guarding Musculoskeletal: no edema or wounds, motor and sensation grossly intact, NT    Labs:  CBC Latest Ref Rng & Units 10/21/2019 10/20/2019 10/19/2019  WBC 4.0 - 10.5 K/uL 4.8 9.5 12.1(H)  Hemoglobin 12.0 - 15.0 g/dL 8.8(L) 9.8(L) 11.4(L)  Hematocrit  36.0 - 46.0 % 26.9(L) 30.6(L) 35.1(L)  Platelets 150 - 400 K/uL 198 217 224   CMP Latest Ref Rng & Units 10/21/2019 10/20/2019 10/19/2019  Glucose 70 - 99 mg/dL 108(H) 114(H) 90  BUN 6 - 20 mg/dL <5(L) 7 9  Creatinine 0.44 - 1.00 mg/dL 1.16(H) 1.20(H) 1.04(H)  Sodium 135 - 145 mmol/L 143 139 137  Potassium 3.5 - 5.1 mmol/L 3.3(L) 3.3(L) 3.6  Chloride 98 - 111 mmol/L 108 104 103  CO2 22 - 32 mmol/L 28 28 24   Calcium 8.9 - 10.3 mg/dL 8.0(L) 7.9(L) 8.1(L)  Total Protein 6.5 - 8.1 g/dL - - -  Total Bilirubin 0.3 - 1.2 mg/dL - - -  Alkaline Phos 38 - 126 U/L - - -  AST 15 - 41 U/L - - -  ALT 0 - 44 U/L - - -     Imaging studies: No new pertinent imaging studies   Assessment/Plan: (ICD-10's: K30.92) 44 y.o. female with clinical improvement and resolution of leukocytosis attributable to recurrent -vs- unresolved diverticulitis with extraluminal air however this was small and likely from previous percutaneous drain, although perforation is also plausible.    - Recommend continue NPO given persistent abdominal pain             - Will plan to repeat CT Abdomen/Pelvis tomorrow to reassess for worsening extraluminal air, abscess, or worsening inflammation.               - Continue IV Abx (Zosyn + Vancomycin); ID also involved             - Monitor labs; improving             -  Pain control prn; antiemetics prn             - serial abdominal examination             - I do not think she warrants any emergent surgical intervention; I think it is reasonable to continue conservative management as it is unclear if she ever recovered from her initial episode of diverticulitis             - Mobilization encouraged              - Further management per primary team; I think it is reasonable to transfer beck to med-surg floor   All of the above findings and recommendations were discussed with the patient, and the medical team, and all of patient's questions were answered to her expressed  satisfaction.   -- Lynden Oxford, PA-C [facility] 10/21/2019, 8:28 AM [phone] M-F: 7am - 4pm

## 2019-10-21 NOTE — Progress Notes (Signed)
Pharmacy Antibiotic Note  Sara Chapman is a 44 y.o. female admitted on 10/18/2019 with bacteremia. Patient with recent history of complicated diverticulitis which was treated with antibiotics and abscess drainage. Pharmacy has been consulted for Vancomycin dosing. Patient is also on pip/tazo and doxycycline. ID has been consulted.  WBC down-trending, afebrile x24h. Doxycycline started yesterday for recent tick bite. Repeat BCx negative so far.   Plan: Vancomycin 1000 mg IV Q 24 hrs. Goal AUC 400-550. Expected AUC: 494.5, Css 12.9 SCr used: 1.16  Daily Scr per protocol. Levels at steady state in 1-2 days or as indicated.  Height: 4\' 11"  (149.9 cm) Weight: 158 lb 8.2 oz (71.9 kg) IBW/kg (Calculated) : 43.2  Temp (24hrs), Avg:98.8 F (37.1 C), Min:98.5 F (36.9 C), Max:99.1 F (37.3 C)  Recent Labs  Lab 10/17/19 2347 10/18/19 0241 10/18/19 0421 10/19/19 0537 10/20/19 0240 10/21/19 0432  WBC 12.0*  --  12.1* 12.1* 9.5 4.8  CREATININE 1.05*  --   --  1.04* 1.20* 1.16*  LATICACIDVEN  --  2.5* 2.0*  --   --   --     Estimated Creatinine Clearance: 52.9 mL/min (A) (by C-G formula based on SCr of 1.16 mg/dL (H)).    Allergies  Allergen Reactions  . Clonazepam Other (See Comments)    SUICIDAL THOUGHTS SUICIDAL THOUGHTS SUICIDAL THOUGHTS   . Ibuprofen Other (See Comments)    stomach ulcers stomach ulcers stomach ulcers stomach ulcers   . Nsaids Other (See Comments)    GI ulcers  . Pseudoephedrine Other (See Comments)    palpitations palpitations   . Tolmetin Other (See Comments)    GI ulcers GI ulcers   . Pseudoephedrine Hcl Other (See Comments) and Palpitations    Unable to tolerate Unable to tolerate   . Tape Rash    Paper tape breaks down the skin and causes sores    Antimicrobials this admission: Pip/tazo 3/20 >>  Vancomycin 3/21 >>  Doxycycline 3/22 >>  Dose adjustments this admission: n/a  Microbiology results: 3/22 BCx: No growth x  24h 3/20 BCx: 3/4 bottles Staphylococcus haemolyticus 3/20 MRSA PCR: negative  Thank you for allowing pharmacy to be a part of this patient's care.  4/20  Pharmacy Resident 10/21/2019 8:08 AM

## 2019-10-21 NOTE — Progress Notes (Signed)
1        [facility] NAME: Sara Chapman    MR#:  [id number]  DATE OF BIRTH:  1976/07/04  SUBJECTIVE:  CHIEF COMPLAINT:   Chief Complaint  Patient presents with  . Abdominal Pain  Abdominal pain somewhat improved.  Does not want to eat as she is very nauseous and dry heaves.  Requests ice chips only.  Husband at bedside REVIEW OF SYSTEMS:  Review of Systems  Constitutional: Negative for diaphoresis, fever, malaise/fatigue and weight loss.  HENT: Negative for ear discharge, ear pain, hearing loss, nosebleeds, sore throat and tinnitus.   Eyes: Negative for blurred vision and pain.  Respiratory: Negative for cough, hemoptysis, shortness of breath and wheezing.   Cardiovascular: Negative for chest pain, palpitations, orthopnea and leg swelling.  Gastrointestinal: Positive for abdominal pain and nausea. Negative for blood in stool, constipation, diarrhea, heartburn and vomiting.  Genitourinary: Negative for dysuria, frequency and urgency.  Musculoskeletal: Negative for back pain and myalgias.  Skin: Negative for itching and rash.  Neurological: Negative for dizziness, tingling, tremors, focal weakness, seizures, weakness and headaches.  Psychiatric/Behavioral: Negative for depression. The patient is not nervous/anxious.    DRUG ALLERGIES:   Allergies  Allergen Reactions  . Clonazepam Other (See Comments)    SUICIDAL THOUGHTS SUICIDAL THOUGHTS SUICIDAL THOUGHTS   . Ibuprofen Other (See Comments)    stomach ulcers stomach ulcers stomach ulcers stomach ulcers   . Nsaids Other (See Comments)    GI ulcers  . Pseudoephedrine Other (See Comments)    palpitations palpitations   . Tolmetin Other (See Comments)    GI ulcers GI ulcers   . Pseudoephedrine Hcl Other (See Comments) and Palpitations    Unable to tolerate Unable to tolerate   . Tape Rash    Paper tape breaks down the skin and causes sores   VITALS:  Blood pressure (!) 152/87,  pulse 68, temperature 98.4 F (36.9 C), resp. rate 16, height 4\' 11"  (1.499 m), weight 71.9 kg, last menstrual period 10/15/2019, SpO2 100 %. PHYSICAL EXAMINATION:  Physical Exam HENT:     Head: Normocephalic and atraumatic.  Eyes:     Conjunctiva/sclera: Conjunctivae normal.     Pupils: Pupils are equal, round, and reactive to light.  Neck:     Thyroid: No thyromegaly.     Trachea: No tracheal deviation.  Cardiovascular:     Rate and Rhythm: Normal rate and regular rhythm.     Heart sounds: Normal heart sounds.  Pulmonary:     Effort: Pulmonary effort is normal. No respiratory distress.     Breath sounds: Normal breath sounds. No wheezing.  Chest:     Chest wall: No tenderness.  Abdominal:     General: Bowel sounds are normal. There is no distension.     Palpations: Abdomen is soft.     Tenderness: There is generalized abdominal tenderness.  Musculoskeletal:        General: Normal range of motion.     Cervical back: Normal range of motion and neck supple.  Skin:    General: Skin is warm and dry.     Findings: No rash.  Neurological:     Mental Status: She is alert and oriented to person, place, and time.     Cranial Nerves: No cranial nerve deficit.    LABORATORY PANEL:  Female CBC Recent Labs  Lab 10/21/19 0432  WBC 4.8  HGB 8.8*  HCT 26.9*  PLT 198   ------------------------------------------------------------------------------------------------------------------  Chemistries  Recent Labs  Lab 10/17/19 2347 10/19/19 0537 10/21/19 0432  NA 141   < > 143  K 3.8   < > 3.3*  CL 107   < > 108  CO2 26   < > 28  GLUCOSE 120*   < > 108*  BUN 12   < > <5*  CREATININE 1.05*   < > 1.16*  CALCIUM 8.5*   < > 8.0*  AST 21  --   --   ALT 18  --   --   ALKPHOS 72  --   --   BILITOT 0.8  --   --    < > = values in this interval not displayed.   RADIOLOGY:  No results found. ASSESSMENT AND PLAN:  44 y.o. female with medical history significant for recurrent  diverticulitis, recently hospitalized under the surgical service for treatment of diverticulitis with perforation and abscess, treated conservatively with percutaneous drains, removed on 09/29/2019 who presents to the emergency room with sudden onset left lower quadrant pain associated with one episode of emesis. Abdominal pain was severe and crampy in left lower quadrant, nonradiating with no alleviating or aggravating factors. Emesis was ingested food stuff.  * Sepsis (HCC) present on admission due to Acute diverticulitis -recurrent in nature -Patient presented with fever, tachycardia, tachypnea, leukocytosis, lactic acid of 2.5 and acute diverticulitis on CT -continue IV Zosyn, vancomycin  -appreciate ID input -IV hydration, IV pain meds -Lactic acid trending down -Surgical input appreciated -no current plans for surgery  -Pain under control, no fever or leukocytosis. -May need repeat CT in next 1 to 2 days per surgery  *Recent tick bite -A little redness and scab over her back - Doxy for 5 days per ID, day 2/5   DVT prophylaxis: SCD's Family Communication: Updated husband at bedside Disposition Plan: Home Barriers to DC - improvement in clinical condition. May need repeat CT per surgery on wed/thursday   All the records are reviewed and case discussed with Care Management/Social Worker. Management plans discussed with the patient, nursing and they are in agreement.  CODE STATUS: Full Code  TOTAL TIME TAKING CARE OF THIS PATIENT: 35 minutes.   More than 50% of the time was spent in counseling/coordination of care: YES  POSSIBLE D/C IN 2-3 DAYS, DEPENDING ON CLINICAL CONDITION.   Delfino Lovett M.D on 10/21/2019 at 1:51 PM  [facility]   CC: Primary care physician; Cathie Hoops, PA  Note: This dictation was prepared with Dragon dictation along with smaller phrase technology. Any transcriptional errors that result from this process are unintentional.

## 2019-10-21 NOTE — Progress Notes (Signed)
[facility] CLINIC INFECTIOUS DISEASE PROGRESS NOTE Date of Admission:  10/18/2019     ID: Nishika Parkhurst is a 44 y.o. female with  diverticulitis Principal Problem:   Sepsis (HCC) Active Problems:   Acute diverticulitis   Subjective: No fevers, a little better but still with abd pain and nausea.  ROS  Eleven systems are reviewed and negative except per hpi  Medications:  Antibiotics Given (last 72 hours)    Date/Time Action Medication Dose Rate   10/18/19 2106 New Bag/Given   piperacillin-tazobactam (ZOSYN) IVPB 3.375 g 3.375 g 12.5 mL/hr   10/19/19 0052 New Bag/Given  [Needed IV access]   vancomycin (VANCOREADY) IVPB 1750 mg/350 mL 1,750 mg 175 mL/hr   10/19/19 0451 New Bag/Given   piperacillin-tazobactam (ZOSYN) IVPB 3.375 g 3.375 g 12.5 mL/hr   10/19/19 1340 New Bag/Given   piperacillin-tazobactam (ZOSYN) IVPB 3.375 g 3.375 g 12.5 mL/hr   10/19/19 2224 New Bag/Given   piperacillin-tazobactam (ZOSYN) IVPB 3.375 g 3.375 g 12.5 mL/hr   10/20/19 0240 New Bag/Given   vancomycin (VANCOCIN) IVPB 1000 mg/200 mL premix 1,000 mg 200 mL/hr   10/20/19 0519 New Bag/Given   piperacillin-tazobactam (ZOSYN) IVPB 3.375 g 3.375 g 12.5 mL/hr   10/20/19 1443 New Bag/Given   piperacillin-tazobactam (ZOSYN) IVPB 3.375 g 3.375 g 12.5 mL/hr   10/20/19 1848 New Bag/Given   doxycycline (VIBRAMYCIN) 100 mg in sodium chloride 0.9 % 250 mL IVPB 100 mg 125 mL/hr   10/20/19 2146 New Bag/Given   piperacillin-tazobactam (ZOSYN) IVPB 3.375 g 3.375 g 12.5 mL/hr   10/21/19 0146 New Bag/Given   vancomycin (VANCOCIN) IVPB 1000 mg/200 mL premix 1,000 mg 200 mL/hr   10/21/19 0538 New Bag/Given   doxycycline (VIBRAMYCIN) 100 mg in sodium chloride 0.9 % 250 mL IVPB 100 mg 125 mL/hr   10/21/19 0902 New Bag/Given   piperacillin-tazobactam (ZOSYN) IVPB 3.375 g 3.375 g 12.5 mL/hr       Objective: Vital signs in last 24 hours: Temp:  [98.4 F (36.9 C)-99.1 F (37.3 C)] 98.4 F (36.9 C) (03/23  1014) Pulse Rate:  [65-88] 68 (03/23 1014) Resp:  [0-23] 16 (03/23 1014) BP: (111-152)/(65-113) 152/87 (03/23 1014) SpO2:  [89 %-100 %] 100 % (03/23 1014) Constitutional:  oriented to person, place, and time. appears well-developed and well-nourished. Mod ill appearing, HENT: [place]/AT, PERRLA, no scleral icterus Mouth/Throat: Oropharynx is clear and moist. No oropharyngeal exudate.  Cardiovascular: Normal rate, regular rhythm and normal heart sounds.  Pulmonary/Chest: Effort normal and breath sounds normal. No respiratory distress.  has no wheezes.  Neck = supple, no nuchal rigidity Abdominal: Soft.+ TTP diffusely Lymphadenopathy: no cervical adenopathy. No axillary adenopathy Neurological: alert and oriented to person, place, and time.  Skin: Skin is warm and dry. No rash noted. No erythema.  Psychiatric: a normal mood and affect.  behavior is normal.   Lab Results Recent Labs    10/20/19 0240 10/21/19 0432  WBC 9.5 4.8  HGB 9.8* 8.8*  HCT 30.6* 26.9*  NA 139 143  K 3.3* 3.3*  CL 104 108  CO2 28 28  BUN 7 <5*  CREATININE 1.20* 1.16*    Microbiology: Results for orders placed or performed during the hospital encounter of 10/18/19  Respiratory Panel by RT PCR (Flu A&B, Covid) -     Status: None   Collection Time: 10/18/19  2:41 AM  Result Value Ref Range Status   SARS Coronavirus 2 by RT PCR NEGATIVE NEGATIVE Final    Comment: (NOTE) SARS-CoV-2 target  nucleic acids are NOT DETECTED. The SARS-CoV-2 RNA is generally detectable in upper respiratoy specimens during the acute phase of infection. The lowest concentration of SARS-CoV-2 viral copies this assay can detect is 131 copies/mL. A negative result does not preclude SARS-Cov-2 infection and should not be used as the sole basis for treatment or other patient management decisions. A negative result may occur with  improper specimen collection/handling, submission of specimen other than nasopharyngeal swab, presence of viral  mutation(s) within the areas targeted by this assay, and inadequate number of viral copies (<131 copies/mL). A negative result must be combined with clinical observations, patient history, and epidemiological information. The expected result is Negative. Fact Sheet for Patients:  https://www.moore.com/ Fact Sheet for Healthcare Providers:  https://www.young.biz/ This test is not yet ap proved or cleared by the [place] FDA and  has been authorized for detection and/or diagnosis of SARS-CoV-2 by FDA under an Emergency Use Authorization (EUA). This EUA will remain  in effect (meaning this test can be used) for the duration of the COVID-19 declaration under Section 564(b)(1) of the Act, 21 U.S.C. section 360bbb-3(b)(1), unless the authorization is terminated or revoked sooner.    Influenza A by PCR NEGATIVE NEGATIVE Final   Influenza B by PCR NEGATIVE NEGATIVE Final    Comment: (NOTE) The Xpert Xpress SARS-CoV-2/FLU/RSV assay is intended as an aid in  the diagnosis of influenza from Nasopharyngeal swab specimens and  should not be used as a sole basis for treatment. Nasal washings and  aspirates are unacceptable for Xpert Xpress SARS-CoV-2/FLU/RSV  testing. Fact Sheet for Patients: https://www.moore.com/ Fact Sheet for Healthcare Providers: https://www.young.biz/ This test is not yet approved or cleared by the [place] FDA and  has been authorized for detection and/or diagnosis of SARS-CoV-2 by  FDA under an Emergency Use Authorization (EUA). This EUA will remain  in effect (meaning this test can be used) for the duration of the  Covid-19 declaration under Section 564(b)(1) of the Act, 21  U.S.C. section 360bbb-3(b)(1), unless the authorization is  terminated or revoked. Performed at [facility], [address] [place], [place] [address]   Culture, blood (routine x 2)     Status:  Abnormal   Collection Time: 10/18/19  2:41 AM   Specimen: BLOOD  Result Value Ref Range Status   Specimen Description   Final    BLOOD RIGHT FOREARM Performed at [facility], [address], [place], [place] [address]    Special Requests   Final    BOTTLES DRAWN AEROBIC AND ANAEROBIC Blood Culture adequate volume Performed at [facility], [address] [place], [place] [address]    Culture  Setup Time   Final    Organism ID to follow GRAM POSITIVE COCCI IN BOTH AEROBIC AND ANAEROBIC BOTTLES CRITICAL RESULT CALLED TO, READ BACK BY AND VERIFIED WITH:  ASAJAH DUNCAN ON 10/18/2019 AT 2051 TIK    Culture (A)  Final    STAPHYLOCOCCUS HAEMOLYTICUS SUSCEPTIBILITIES PERFORMED ON PREVIOUS CULTURE WITHIN THE LAST 5 DAYS. Performed at [facility], [address], [place], [place] [address]    Report Status 10/21/2019 FINAL  Final  Culture, blood (routine x 2)     Status: Abnormal   Collection Time: 10/18/19  2:41 AM   Specimen: BLOOD  Result Value Ref Range Status   Specimen Description   Final    BLOOD LEFT ANTECUBITAL Performed at [facility], [address] [place] [address], [place], [place] [address]    Special  Requests   Final    BOTTLES DRAWN AEROBIC AND ANAEROBIC Blood Culture adequate volume Performed at [facility], [address] [place], [place] [address]    Culture  Setup Time   Final    GRAM POSITIVE COCCI ANAEROBIC BOTTLE ONLY CRITICAL RESULT CALLED TO, READ BACK BY AND VERIFIED WITH: [facility] DUNCAN AT 1742 10/18/19.PMF Performed at [facility], [address] [place], [place] [address]    Culture STAPHYLOCOCCUS HAEMOLYTICUS (A)  Final   Report Status 10/21/2019 FINAL  Final   Organism ID, Bacteria STAPHYLOCOCCUS HAEMOLYTICUS  Final      Susceptibility   Staphylococcus haemolyticus - MIC*    CIPROFLOXACIN <=0.5 SENSITIVE Sensitive     ERYTHROMYCIN >=8 RESISTANT Resistant     GENTAMICIN <=0.5 SENSITIVE Sensitive      OXACILLIN >=4 RESISTANT Resistant     TETRACYCLINE >=16 RESISTANT Resistant     VANCOMYCIN 1 SENSITIVE Sensitive     TRIMETH/SULFA <=10 SENSITIVE Sensitive     CLINDAMYCIN 1 INTERMEDIATE Intermediate     RIFAMPIN <=0.5 SENSITIVE Sensitive     Inducible Clindamycin NEGATIVE Sensitive     * STAPHYLOCOCCUS HAEMOLYTICUS  Blood Culture ID Panel (Reflexed)     Status: Abnormal   Collection Time: 10/18/19  2:41 AM  Result Value Ref Range Status   Enterococcus species NOT DETECTED NOT DETECTED Final   Listeria monocytogenes NOT DETECTED NOT DETECTED Final   Staphylococcus species DETECTED (A) NOT DETECTED Final    Comment: Methicillin (oxacillin) resistant coagulase negative staphylococcus. Possible blood culture contaminant (unless isolated from more than one blood culture draw or clinical case suggests pathogenicity). No antibiotic treatment is indicated for blood  culture contaminants. CRITICAL RESULT CALLED TO, READ BACK BY AND VERIFIED WITH: ASAJAH DUNCAN ON 10/18/2019 AT 2051 TIK    Staphylococcus aureus (BCID) NOT DETECTED NOT DETECTED Final   Methicillin resistance DETECTED (A) NOT DETECTED Final    Comment: CRITICAL RESULT CALLED TO, READ BACK BY AND VERIFIED WITH: ASAJAH DUNCAN ON 10/18/2019 AT 2051 TIK    Streptococcus species NOT DETECTED NOT DETECTED Final   Streptococcus agalactiae NOT DETECTED NOT DETECTED Final   Streptococcus pneumoniae NOT DETECTED NOT DETECTED Final   Streptococcus pyogenes NOT DETECTED NOT DETECTED Final   Acinetobacter baumannii NOT DETECTED NOT DETECTED Final   Enterobacteriaceae species NOT DETECTED NOT DETECTED Final   Enterobacter cloacae complex NOT DETECTED NOT DETECTED Final   Escherichia coli NOT DETECTED NOT DETECTED Final   Klebsiella oxytoca NOT DETECTED NOT DETECTED Final   Klebsiella pneumoniae NOT DETECTED NOT DETECTED Final   Proteus species NOT DETECTED NOT DETECTED Final   Serratia marcescens NOT DETECTED NOT DETECTED Final    Haemophilus influenzae NOT DETECTED NOT DETECTED Final   Neisseria meningitidis NOT DETECTED NOT DETECTED Final   Pseudomonas aeruginosa NOT DETECTED NOT DETECTED Final   Candida albicans NOT DETECTED NOT DETECTED Final   Candida glabrata NOT DETECTED NOT DETECTED Final   Candida krusei NOT DETECTED NOT DETECTED Final   Candida parapsilosis NOT DETECTED NOT DETECTED Final   Candida tropicalis NOT DETECTED NOT DETECTED Final    Comment: Performed at [facility], [address] [place], [place] [address]  MRSA PCR Screening     Status: None   Collection Time: 10/18/19  6:44 PM   Specimen: Nasopharyngeal  Result Value Ref Range Status   MRSA by PCR NEGATIVE NEGATIVE Final    Comment:        The GeneXpert MRSA Assay (FDA approved  for NASAL specimens only), is one component of a comprehensive MRSA colonization surveillance program. It is not intended to diagnose MRSA infection nor to guide or monitor treatment for MRSA infections. Performed at [facility], [address] [place], [place] [address]   Culture, blood (Routine X 2) w Reflex to ID Panel     Status: None (Preliminary result)   Collection Time: 10/20/19  2:58 PM   Specimen: BLOOD  Result Value Ref Range Status   Specimen Description BLOOD BLOOD LEFT HAND  Final   Special Requests   Final    BOTTLES DRAWN AEROBIC AND ANAEROBIC Blood Culture adequate volume   Culture   Final    NO GROWTH < 24 HOURS Performed at [facility], [address], [place], [place] [address]    Report Status PENDING  Incomplete  Culture, blood (Routine X 2) w Reflex to ID Panel     Status: None (Preliminary result)   Collection Time: 10/20/19  3:05 PM   Specimen: BLOOD  Result Value Ref Range Status   Specimen Description BLOOD BLOOD RIGHT HAND  Final   Special Requests   Final    BOTTLES DRAWN AEROBIC AND ANAEROBIC Blood Culture adequate volume   Culture   Final    NO GROWTH < 24 HOURS Performed at [facility], [address] [place], [place] [address]    Report Status PENDING  Incomplete    Studies/Results: No results found.  Assessment/Plan: Amulya Quintin is a 44 y.o. 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 Monitor wbc and fever curve. Repeat imaging per surgery guidance. For the bacteremia- I suspect contaminant as no PICC line in place, no skin and soft tissue infections, no UTI sxs and UA neg.  However will repeat bcx and cont vanco pending follow up.  If BX neg at 76 hours can dc vanco- would have received about 7 days treatment  Thank you very much for the consult. Will follow with you.  Leonel Ramsay   10/21/2019, 1:53 PM

## 2019-10-22 ENCOUNTER — Inpatient Hospital Stay: Payer: BC Managed Care – PPO

## 2019-10-22 ENCOUNTER — Encounter: Payer: Self-pay | Admitting: Internal Medicine

## 2019-10-22 DIAGNOSIS — R1084 Generalized abdominal pain: Secondary | ICD-10-CM

## 2019-10-22 LAB — CBC
HCT: 26.8 % — ABNORMAL LOW (ref 36.0–46.0)
Hemoglobin: 9 g/dL — ABNORMAL LOW (ref 12.0–15.0)
MCH: 30.8 pg (ref 26.0–34.0)
MCHC: 33.6 g/dL (ref 30.0–36.0)
MCV: 91.8 fL (ref 80.0–100.0)
Platelets: 219 10*3/uL (ref 150–400)
RBC: 2.92 MIL/uL — ABNORMAL LOW (ref 3.87–5.11)
RDW: 12.7 % (ref 11.5–15.5)
WBC: 4.1 10*3/uL (ref 4.0–10.5)
nRBC: 0 % (ref 0.0–0.2)

## 2019-10-22 LAB — BASIC METABOLIC PANEL
Anion gap: 6 (ref 5–15)
BUN: <5 mg/dL — ABNORMAL LOW (ref 6–20)
CO2: 29 mmol/L (ref 22–32)
Calcium: 8.4 mg/dL — ABNORMAL LOW (ref 8.9–10.3)
Chloride: 110 mmol/L (ref 98–111)
Creatinine, Ser: 1.18 mg/dL — ABNORMAL HIGH (ref 0.44–1.00)
GFR calc Af Amer: >60 mL/min (ref >60)
GFR calc non Af Amer: 56 mL/min — ABNORMAL LOW (ref >60)
Glucose, Bld: 116 mg/dL — ABNORMAL HIGH (ref 70–99)
Potassium: 3.1 mmol/L — ABNORMAL LOW (ref 3.5–5.1)
Sodium: 145 mmol/L (ref 135–145)

## 2019-10-22 LAB — GLUCOSE, CAPILLARY
Glucose-Capillary: 121 mg/dL — ABNORMAL HIGH (ref 70–99)
Glucose-Capillary: 91 mg/dL (ref 70–99)
Glucose-Capillary: 97 mg/dL (ref 70–99)

## 2019-10-22 LAB — HEPATIC FUNCTION PANEL
ALT: 10 U/L (ref 0–44)
AST: 11 U/L — ABNORMAL LOW (ref 15–41)
Albumin: 2.4 g/dL — ABNORMAL LOW (ref 3.5–5.0)
Alkaline Phosphatase: 94 U/L (ref 38–126)
Bilirubin, Direct: 0.2 mg/dL (ref 0.0–0.2)
Indirect Bilirubin: 0.4 mg/dL (ref 0.3–0.9)
Total Bilirubin: 0.6 mg/dL (ref 0.3–1.2)
Total Protein: 5.8 g/dL — ABNORMAL LOW (ref 6.5–8.1)

## 2019-10-22 MED ORDER — TOPIRAMATE 100 MG PO TABS
100.0000 mg | ORAL_TABLET | Freq: Two times a day (BID) | ORAL | Status: DC
  Administered 2019-10-22 – 2019-10-26 (×8): 100 mg via ORAL
  Filled 2019-10-22 (×9): qty 1

## 2019-10-22 MED ORDER — SODIUM CHLORIDE 0.9 % IV SOLN
INTRAVENOUS | Status: DC | PRN
  Administered 2019-10-22: 14:00:00 25 mL via INTRAVENOUS
  Administered 2019-10-22 – 2019-10-24 (×2): 250 mL via INTRAVENOUS

## 2019-10-22 MED ORDER — ALPRAZOLAM 0.25 MG PO TABS
0.2500 mg | ORAL_TABLET | Freq: Every evening | ORAL | Status: DC | PRN
  Administered 2019-10-22: 0.25 mg via ORAL
  Filled 2019-10-22: qty 1

## 2019-10-22 MED ORDER — IOHEXOL 300 MG/ML  SOLN
100.0000 mL | Freq: Once | INTRAMUSCULAR | Status: AC | PRN
  Administered 2019-10-22: 09:00:00 100 mL via INTRAVENOUS

## 2019-10-22 MED ORDER — IOHEXOL 9 MG/ML PO SOLN
500.0000 mL | ORAL | Status: AC
  Administered 2019-10-22: 07:00:00 500 mL via ORAL

## 2019-10-22 MED ORDER — KCL IN DEXTROSE-NACL 20-5-0.45 MEQ/L-%-% IV SOLN
INTRAVENOUS | Status: DC
  Filled 2019-10-22 (×12): qty 1000

## 2019-10-22 MED ORDER — POTASSIUM CHLORIDE 10 MEQ/100ML IV SOLN
10.0000 meq | INTRAVENOUS | Status: AC
  Administered 2019-10-22 (×2): 10 meq via INTRAVENOUS
  Filled 2019-10-22 (×2): qty 100

## 2019-10-22 MED ORDER — HYDRALAZINE HCL 20 MG/ML IJ SOLN
10.0000 mg | Freq: Four times a day (QID) | INTRAMUSCULAR | Status: DC | PRN
  Administered 2019-10-22 – 2019-10-25 (×3): 10 mg via INTRAVENOUS
  Filled 2019-10-22 (×5): qty 1

## 2019-10-22 NOTE — Progress Notes (Signed)
PROGRESS NOTE                                                                                                                                                                                                             Patient Demographics:    Sara Chapman, is a 44 y.o. female, DOB - 07/25/1976, UKG:[id number]  Admit date - 10/18/2019   Admitting Physician Andris Baumann, MD  Outpatient Primary MD for the patient is Cathie Hoops, PA  LOS - 4  Outpatient Specialists:   Chief Complaint  Patient presents with  . Abdominal Pain       Brief Narrative 44 year old obese female with recurrent diverticulitis (recent hospitalization for diverticulitis with perforation and abscess which was treated with percutaneous drain that was removed on 09/29/2019) presented to the ED with acute onset of left lower quadrant pain with vomiting. Patient admitted for sepsis (fever, lactic acid 2.5, leukocytosis, tachycardia and tachypnea) With recurrent diverticulitis.   Subjective:   Still having left lower quadrant abdominal pain radiating to the sides.  No nausea or vomiting.  Passing gas and reports having watery stool last night.   Assessment  & Plan :    Principal Problem: Acute diverticulitis, recurrent Sepsis on admission resolved.  Continue empiric IV vancomycin and Zosyn.  Remains NPO.  IV fluids and IV morphine for pain.  Blood culture growing staph hemolyticus (suspect contaminant).  Follow repeat blood culture. Repeat CT abdomen pelvis shows similar paracolic abscess along the sigmoid colon but enlarged elongated abscess that is tracking along at this loops of small bowel about 10 cc in volume with increased surrounding mesenteric edema.  (The abscess along the small bowel is reportedly increased from prior CT).  Also has distended gallbladder with wall thickening versus pericholecystic fluid.  We will follow up with further  recommendations from surgery.  Continue serial abdominal exam.  Monitor electrolytes and replenish.  Active problems Recent tick bite over left upper back On IV doxycycline for 5 days per ID.  Obesity (BMI 32 kg/m)   Code Status : Full code  Family Communication  : None  Disposition Plan  : Pending hospital course  Barriers For Discharge : Active diverticulitis  Consults  : Surgery  Procedures  : CT abdomen  DVT Prophylaxis  :  Lovenox -  Lab Results  Component Value  Date   PLT 219 10/22/2019    Antibiotics  :    Anti-infectives (From admission, onward)   Start     Dose/Rate Route Frequency Ordered Stop   10/20/19 1800  doxycycline (VIBRAMYCIN) 100 mg in sodium chloride 0.9 % 250 mL IVPB     100 mg 125 mL/hr over 120 Minutes Intravenous Every 12 hours 10/20/19 1641 10/25/19 1759   10/20/19 0200  vancomycin (VANCOCIN) IVPB 1000 mg/200 mL premix     1,000 mg 200 mL/hr over 60 Minutes Intravenous Every 24 hours 10/19/19 0347     10/18/19 2330  vancomycin (VANCOREADY) IVPB 1750 mg/350 mL  Status:  Discontinued     1,750 mg 175 mL/hr over 120 Minutes Intravenous Every 8 hours 10/18/19 2227 10/18/19 2251   10/18/19 2330  vancomycin (VANCOREADY) IVPB 1750 mg/350 mL     1,750 mg 175 mL/hr over 120 Minutes Intravenous  Once 10/18/19 2251 10/19/19 0254   10/18/19 0600  piperacillin-tazobactam (ZOSYN) IVPB 3.375 g     3.375 g 12.5 mL/hr over 240 Minutes Intravenous Every 8 hours 10/18/19 0352     10/18/19 0245  piperacillin-tazobactam (ZOSYN) IVPB 3.375 g     3.375 g 100 mL/hr over 30 Minutes Intravenous  Once 10/18/19 0232 10/18/19 0330        Objective:   Vitals:   10/21/19 1014 10/21/19 1524 10/22/19 0051 10/22/19 0746  BP: (!) 152/87 116/63 (!) 175/102 (!) 179/100  Pulse: 68 67 86 77  Resp: Temp: 98.4 F (36.9 C) 98.3 F (36.8 C) 98.1 F (36.7 C) 98.3 F (36.8 C)  TempSrc:  Oral Oral Oral  SpO2: 100% 93% 94% 93%  Weight:      Height:         Wt Readings from Last 3 Encounters:  10/19/19 71.9 kg  09/30/19 69.2 kg  09/23/19 68.5 kg     Intake/Output Summary (Last 24 hours) at 10/22/2019 1329 Last data filed at 10/22/2019 1116 Gross per 24 hour  Intake 2817.43 ml  Output 100 ml  Net 2717.43 ml     Physical Exam  Gen: not in distress HEENTmoist mucosa, supple neck Chest: clear b/l, no added sounds CVS: N S1&S2, no murmurs, GI: soft, nondistended, bowel sounds present, tender to pressure over bilateral lower abdominal quadrant Musculoskeletal: warm, no edema     Data Review:    CBC Recent Labs  Lab 10/18/19 0421 10/19/19 0537 10/20/19 0240 10/21/19 0432 10/22/19 0440  WBC 12.1* 12.1* 9.5 4.8 4.1  HGB 11.5* 11.4* 9.8* 8.8* 9.0*  HCT 34.4* 35.1* 30.6* 26.9* 26.8*  PLT 237 224 217 198 219  MCV 93.5 94.6 95.0 95.1 91.8  MCH 31.3 30.7 30.4 31.1 30.8  MCHC 33.4 32.5 32.0 32.7 33.6  RDW 13.1 13.1 13.0 12.7 12.7  LYMPHSABS 1.0  --   --   --   --   MONOABS 0.4  --   --   --   --   EOSABS 0.0  --   --   --   --   BASOSABS 0.0  --   --   --   --     Chemistries  Recent Labs  Lab 10/17/19 2347 10/19/19 0537 10/20/19 0240 10/21/19 0432 10/22/19 0440  NA 141 137 139 143 145  K 3.8 3.6 3.3* 3.3* 3.1*  CL 107 103 104 108 110  CO2 GLUCOSE 120* 90 114* 108* 116*  BUN 12 9  7 <5* <5*  CREATININE 1.05* 1.04* 1.20* 1.16* 1.18*  CALCIUM 8.5* 8.1* 7.9* 8.0* 8.4*  AST 21  --   --   --   --   ALT 18  --   --   --   --   ALKPHOS 72  --   --   --   --   BILITOT 0.8  --   --   --   --    ------------------------------------------------------------------------------------------------------------------ No results for input(s): CHOL, HDL, LDLCALC, TRIG, CHOLHDL, LDLDIRECT in the last 72 hours.  No results found for: HGBA1C ------------------------------------------------------------------------------------------------------------------ No results for input(s): TSH, T4TOTAL, T3FREE, THYROIDAB  in the last 72 hours.  Invalid input(s): FREET3 ------------------------------------------------------------------------------------------------------------------ No results for input(s): VITAMINB12, FOLATE, FERRITIN, TIBC, IRON, RETICCTPCT in the last 72 hours.  Coagulation profile Recent Labs  Lab 10/18/19 0421  INR 1.1    No results for input(s): DDIMER in the last 72 hours.  Cardiac Enzymes No results for input(s): CKMB, TROPONINI, MYOGLOBIN in the last 168 hours.  Invalid input(s): CK ------------------------------------------------------------------------------------------------------------------ No results found for: BNP  Inpatient Medications  Scheduled Meds: Continuous Infusions: . dextrose 5 % and 0.45 % NaCl with KCl 20 mEq/L    . doxycycline (VIBRAMYCIN) IV Stopped (10/22/19 0730)  . piperacillin-tazobactam Stopped (10/22/19 0758)  . vancomycin Stopped (10/22/19 0334)   PRN Meds:.acetaminophen **OR** acetaminophen, morphine injection, ondansetron **OR** ondansetron (ZOFRAN) IV  Micro Results Recent Results (from the past 240 hour(s))  Respiratory Panel by RT PCR (Flu A&B, Covid) -     Status: None   Collection Time: 10/18/19  2:41 AM  Result Value Ref Range Status   SARS Coronavirus 2 by RT PCR NEGATIVE NEGATIVE Final    Comment: (NOTE) SARS-CoV-2 target nucleic acids are NOT DETECTED. The SARS-CoV-2 RNA is generally detectable in upper respiratoy specimens during the acute phase of infection. The lowest concentration of SARS-CoV-2 viral copies this assay can detect is 131 copies/mL. A negative result does not preclude SARS-Cov-2 infection and should not be used as the sole basis for treatment or other patient management decisions. A negative result may occur with  improper specimen collection/handling, submission of specimen other than nasopharyngeal swab, presence of viral mutation(s) within the areas targeted by this assay, and inadequate number of  viral copies (<131 copies/mL). A negative result must be combined with clinical observations, patient history, and epidemiological information. The expected result is Negative. Fact Sheet for Patients:  https://www.moore.com/ Fact Sheet for Healthcare Providers:  https://www.young.biz/ This test is not yet ap proved or cleared by the [place] FDA and  has been authorized for detection and/or diagnosis of SARS-CoV-2 by FDA under an Emergency Use Authorization (EUA). This EUA will remain  in effect (meaning this test can be used) for the duration of the COVID-19 declaration under Section 564(b)(1) of the Act, 21 U.S.C. section 360bbb-3(b)(1), unless the authorization is terminated or revoked sooner.    Influenza A by PCR NEGATIVE NEGATIVE Final   Influenza B by PCR NEGATIVE NEGATIVE Final    Comment: (NOTE) The Xpert Xpress SARS-CoV-2/FLU/RSV assay is intended as an aid in  the diagnosis of influenza from Nasopharyngeal swab specimens and  should not be used as a sole basis for treatment. Nasal washings and  aspirates are unacceptable for Xpert Xpress SARS-CoV-2/FLU/RSV  testing. Fact Sheet for Patients: https://www.moore.com/ Fact Sheet for Healthcare Providers: https://www.young.biz/ This test is not yet approved or cleared by the [place] FDA and  has been authorized for detection and/or diagnosis  of SARS-CoV-2 by  FDA under an Emergency Use Authorization (EUA). This EUA will remain  in effect (meaning this test can be used) for the duration of the  Covid-19 declaration under Section 564(b)(1) of the Act, 21  U.S.C. section 360bbb-3(b)(1), unless the authorization is  terminated or revoked. Performed at [facility], [address] [place], [place] [address]   Culture, blood (routine x 2)     Status: Abnormal   Collection Time: 10/18/19  2:41 AM   Specimen: BLOOD  Result Value  Ref Range Status   Specimen Description   Final    BLOOD RIGHT FOREARM Performed at [facility], [address] [place], [place] [address]    Special Requests   Final    BOTTLES DRAWN AEROBIC AND ANAEROBIC Blood Culture adequate volume Performed at [facility], [address] [place], [place] [address]    Culture  Setup Time   Final    Organism ID to follow GRAM POSITIVE COCCI IN BOTH AEROBIC AND ANAEROBIC BOTTLES CRITICAL RESULT CALLED TO, READ BACK BY AND VERIFIED WITH:  ASAJAH DUNCAN ON 10/18/2019 AT 2051 TIK    Culture (A)  Final    STAPHYLOCOCCUS HAEMOLYTICUS SUSCEPTIBILITIES PERFORMED ON PREVIOUS CULTURE WITHIN THE LAST 5 DAYS. Performed at [facility], [address] [place], [place] [address]    Report Status 10/21/2019 FINAL  Final  Culture, blood (routine x 2)     Status: Abnormal   Collection Time: 10/18/19  2:41 AM   Specimen: BLOOD  Result Value Ref Range Status   Specimen Description   Final    BLOOD LEFT ANTECUBITAL Performed at [facility], [address] [place], [place] [address]    Special Requests   Final    BOTTLES DRAWN AEROBIC AND ANAEROBIC Blood Culture adequate volume Performed at [facility], [address] [place], [place] [address]    Culture  Setup Time   Final    GRAM POSITIVE COCCI ANAEROBIC BOTTLE ONLY CRITICAL RESULT CALLED TO, READ BACK BY AND VERIFIED WITH: [facility] Campus[facility]JH DUNCAN AT 1742 10/18/19.PMF Performed at [facility], [address] [place], [place] [address]    Culture STAPHYLOCOCCUS HAEMOLYTICUS (A)  Final   Report Status 10/21/2019 FINAL  Final   Organism ID, Bacteria STAPHYLOCOCCUS HAEMOLYTICUS  Final      Susceptibility   Staphylococcus haemolyticus - MIC*    CIPROFLOXACIN <=0.5 SENSITIVE Sensitive     ERYTHROMYCIN >=8 RESISTANT Resistant     GENTAMICIN <=0.5 SENSITIVE Sensitive     OXACILLIN >=4 RESISTANT Resistant     TETRACYCLINE >=16 RESISTANT Resistant      VANCOMYCIN 1 SENSITIVE Sensitive     TRIMETH/SULFA <=10 SENSITIVE Sensitive     CLINDAMYCIN 1 INTERMEDIATE Intermediate     RIFAMPIN <=0.5 SENSITIVE Sensitive     Inducible Clindamycin NEGATIVE Sensitive     * STAPHYLOCOCCUS HAEMOLYTICUS  Blood Culture ID Panel (Reflexed)     Status: Abnormal   Collection Time: 10/18/19  2:41 AM  Result Value Ref Range Status   Enterococcus species NOT DETECTED NOT DETECTED Final   Listeria monocytogenes NOT DETECTED NOT DETECTED Final   Staphylococcus species DETECTED (A) NOT DETECTED Final    Comment: Methicillin (oxacillin) resistant coagulase negative staphylococcus. Possible blood culture contaminant (unless isolated from more than one blood culture draw or clinical case suggests pathogenicity). No antibiotic treatment is indicated for blood  culture contaminants. CRITICAL RESULT CALLED TO, READ BACK BY AND VERIFIED WITH: ASAJAH  DUNCAN ON 10/18/2019 AT 2051 TIK    Staphylococcus aureus (BCID) NOT DETECTED NOT DETECTED Final   Methicillin resistance DETECTED (A) NOT DETECTED Final    Comment: CRITICAL RESULT CALLED TO, READ BACK BY AND VERIFIED WITH: ASAJAH DUNCAN ON 10/18/2019 AT 2051 TIK    Streptococcus species NOT DETECTED NOT DETECTED Final   Streptococcus agalactiae NOT DETECTED NOT DETECTED Final   Streptococcus pneumoniae NOT DETECTED NOT DETECTED Final   Streptococcus pyogenes NOT DETECTED NOT DETECTED Final   Acinetobacter baumannii NOT DETECTED NOT DETECTED Final   Enterobacteriaceae species NOT DETECTED NOT DETECTED Final   Enterobacter cloacae complex NOT DETECTED NOT DETECTED Final   Escherichia coli NOT DETECTED NOT DETECTED Final   Klebsiella oxytoca NOT DETECTED NOT DETECTED Final   Klebsiella pneumoniae NOT DETECTED NOT DETECTED Final   Proteus species NOT DETECTED NOT DETECTED Final   Serratia marcescens NOT DETECTED NOT DETECTED Final   Haemophilus influenzae NOT DETECTED NOT DETECTED Final   Neisseria meningitidis NOT  DETECTED NOT DETECTED Final   Pseudomonas aeruginosa NOT DETECTED NOT DETECTED Final   Candida albicans NOT DETECTED NOT DETECTED Final   Candida glabrata NOT DETECTED NOT DETECTED Final   Candida krusei NOT DETECTED NOT DETECTED Final   Candida parapsilosis NOT DETECTED NOT DETECTED Final   Candida tropicalis NOT DETECTED NOT DETECTED Final    Comment: Performed at [facility], [address] [place], [place] [address]  MRSA PCR Screening     Status: None   Collection Time: 10/18/19  6:44 PM   Specimen: Nasopharyngeal  Result Value Ref Range Status   MRSA by PCR NEGATIVE NEGATIVE Final    Comment:        The GeneXpert MRSA Assay (FDA approved for NASAL specimens only), is one component of a comprehensive MRSA colonization surveillance program. It is not intended to diagnose MRSA infection nor to guide or monitor treatment for MRSA infections. Performed at [facility], [address] [place], [place] [address]   Culture, blood (Routine X 2) w Reflex to ID Panel     Status: None (Preliminary result)   Collection Time: 10/20/19  2:58 PM   Specimen: BLOOD  Result Value Ref Range Status   Specimen Description BLOOD BLOOD LEFT HAND  Final   Special Requests   Final    BOTTLES DRAWN AEROBIC AND ANAEROBIC Blood Culture adequate volume   Culture   Final    NO GROWTH 2 DAYS Performed at [facility], [address] [place], [place] [address]    Report Status PENDING  Incomplete  Culture, blood (Routine X 2) w Reflex to ID Panel     Status: None (Preliminary result)   Collection Time: 10/20/19  3:05 PM   Specimen: BLOOD  Result Value Ref Range Status   Specimen Description BLOOD BLOOD RIGHT HAND  Final   Special Requests   Final    BOTTLES DRAWN AEROBIC AND ANAEROBIC Blood Culture adequate volume   Culture   Final    NO GROWTH 2 DAYS Performed at [facility], [address] [place], [place] [address]    Report Status PENDING   Incomplete    Radiology Reports CT ABDOMEN PELVIS W CONTRAST  Result Date: 10/22/2019 CLINICAL DATA:  Diverticulitis follow up EXAM: CT ABDOMEN AND PELVIS WITH CONTRAST TECHNIQUE: Multidetector CT imaging of the abdomen and pelvis was performed using the standard protocol following bolus administration of intravenous contrast. CONTRAST:  122mL OMNIPAQUE IOHEXOL 300 MG/ML  SOLN COMPARISON:  10/18/2019 FINDINGS: Lower chest: New small to moderate bilateral pleural effusions with associated passive atelectasis. Hepatobiliary: Distended gallbladder with suspected Phrygian cap. Gallbladder wall thickening versus pericholecystic fluid. No focal hepatic parenchymal lesion is identified. Pancreas: Unremarkable Spleen: Unremarkable Adrenals/Urinary Tract: Unremarkable Stomach/Bowel: There is a collection of abscesses resembling a string of beads extending from a sigmoid colon paracolic abscess with gas and fluid components on images 49 through 54 series 2, up along adjacent loops of small bowel. Whereas previously there was simply loculations of gas along the small bowel, there is now a more confluent elongated abscess measuring 6.8 by 1.7 by 1.7 cm (volume = 10 cm^3) on image 77/5. The portion of the abscess adjacent to the sigmoid colon is multilobulated but measures about 3.5 by 1.5 by 2.3 cm (volume = 6.3 cm^3). There are several additional tiny loculations of gas along the regional mesentery. Overall the appearance is worsened from 10/18/2019. Vascular/Lymphatic: Small reactive retroperitoneal and pelvic lymph nodes. Reproductive: Suspected small anterior fundal fibroid, a bowel 1.4 cm in diameter. Other: Increased stranding/edema in the mesentery. Trace free fluid along the right hepatic lobe margin similar to prior. Musculoskeletal: Unremarkable IMPRESSION: 1. Paracolic abscess along the sigmoid colon is itself similar in size to the prior exam, but there is an enlarging elongated abscess tracking along adjacent  loops of small bowel currently 10 cubic cm in volume, with increased surrounding mesenteric edema. This abscess along the small bowel is notably increased from prior. 2. New small to moderate bilateral pleural effusions with associated passive atelectasis. 3. Distended gallbladder with wall thickening versus pericholecystic fluid. 4. Suspected small anterior fundal fibroid. 5. Trace free fluid along the right hepatic lobe margin, similar to prior. Electronically Signed   By: Gaylyn Rong M.D.   On: 10/22/2019 11:14   CT ABDOMEN PELVIS W CONTRAST  Result Date: 10/18/2019 CLINICAL DATA:  Abdominal distension EXAM: CT ABDOMEN AND PELVIS WITH CONTRAST TECHNIQUE: Multidetector CT imaging of the abdomen and pelvis was performed using the standard protocol following bolus administration of intravenous contrast. CONTRAST:  OMNIPAQUE IOHEXOL 300 MG/ML  SOLN COMPARISON:  09/29/2019 FINDINGS: LOWER CHEST: Normal. HEPATOBILIARY: Normal hepatic contours. No intra- or extrahepatic biliary dilatation. Status post cholecystectomy. PANCREAS: Normal pancreas. No ductal dilatation or peripancreatic fluid collection. SPLEEN: Normal. ADRENALS/URINARY TRACT: The adrenal glands are normal. No hydronephrosis, nephroureterolithiasis or solid renal mass. The urinary bladder is normal for degree of distention STOMACH/BOWEL: There is a small amounts of gas again seen in the left mid abdomen at the site of the now removed percutaneous drain. There is mild stranding surrounding the left upper quadrant small bowel and the junction of the sigmoid and descending colon. The degree of inflammatory changes greater than on the prior study. There is no discrete fluid collection. VASCULAR/LYMPHATIC: Normal course and caliber of the major abdominal vessels. No abdominal or pelvic lymphadenopathy. REPRODUCTIVE: Multiple uterine fibroids. MUSCULOSKELETAL. No bony spinal canal stenosis or focal osseous abnormality. OTHER: None. IMPRESSION:  Increased inflammatory change surrounding the left upper quadrant small bowel and the junction of the sigmoid and descending colon, likely indicating worsening/recurrent diverticulitis. Small amounts of free air in the left mid abdomen may have been introduced by the drainage catheter, but recurrent perforation could also cause this appearance. Electronically Signed   By: Deatra Robinson M.D.   On: 10/18/2019 01:33   CT Abdomen Pelvis W Contrast  Result Date: 09/29/2019 CLINICAL DATA:  Follow-up diverticulitis and diverticular abscess. Status post percutaneous catheter drainage. EXAM: CT ABDOMEN AND PELVIS  WITH CONTRAST TECHNIQUE: Multidetector CT imaging of the abdomen and pelvis was performed using the standard protocol following bolus administration of intravenous contrast. CONTRAST:  OMNIPAQUE IOHEXOL 300 MG/ML  SOLN COMPARISON:  09/19/2019 FINDINGS: Lower Chest: No acute findings. Hepatobiliary: No hepatic masses identified. Gallbladder is unremarkable. No evidence of biliary ductal dilatation. Pancreas:  No mass or inflammatory changes. Spleen: Within normal limits in size and appearance. Adrenals/Urinary Tract: No masses identified. No evidence of ureteral calculi or hydronephrosis. Stomach/Bowel: Mild proximal sigmoid diverticulitis shows no significant change. Percutaneous drainage catheter remains in place and there is no residual fluid collection identified. Vascular/Lymphatic: No pathologically enlarged lymph nodes. No abdominal aortic aneurysm. Reproductive: Small anterior uterine fibroid again seen measuring approximately 2 cm. No adnexal mass or free fluid identified. Other:  None. Musculoskeletal:  No suspicious bone lesions identified. IMPRESSION: 1. Mild proximal sigmoid diverticulitis, without significant change. Left lower quadrant percutaneous drainage catheter remains in place. No residual abscess or other fluid collections identified. 2. Stable small anterior uterine fibroid.  Electronically Signed   By: Danae Orleans M.D.   On: 09/29/2019 16:18    Time Spent in minutes 35   Yoana Staib M.D on 10/22/2019 at 1:29 PM  Between 7am to 7pm - Pager - [phone]  After 7pm go to [email or web address] - password [facility]  [facility] -  Office  [phone]

## 2019-10-22 NOTE — Progress Notes (Signed)
Pt's 4 am fingerstick was 121. Did not flow over into epic.

## 2019-10-22 NOTE — Progress Notes (Signed)
[facility] CLINIC INFECTIOUS DISEASE PROGRESS NOTE Date of Admission:  10/18/2019     ID: Sara Chapman is a 44 y.o. female with  diverticulitis Principal Problem:   Sepsis (HCC) Active Problems:   Acute diverticulitis   Subjective: No fevers, a little better but still with abd pain and nausea. Vomited a few times last night but none today. Some diarrhea  ROS  Eleven systems are reviewed and negative except per hpi  Medications:  Antibiotics Given (last 72 hours)    Date/Time Action Medication Dose Rate   10/19/19 1340 New Bag/Given   piperacillin-tazobactam (ZOSYN) IVPB 3.375 g 3.375 g 12.5 mL/hr   10/19/19 2224 New Bag/Given   piperacillin-tazobactam (ZOSYN) IVPB 3.375 g 3.375 g 12.5 mL/hr   10/20/19 0240 New Bag/Given   vancomycin (VANCOCIN) IVPB 1000 mg/200 mL premix 1,000 mg 200 mL/hr   10/20/19 0519 New Bag/Given   piperacillin-tazobactam (ZOSYN) IVPB 3.375 g 3.375 g 12.5 mL/hr   10/20/19 1443 New Bag/Given   piperacillin-tazobactam (ZOSYN) IVPB 3.375 g 3.375 g 12.5 mL/hr   10/20/19 1848 New Bag/Given   doxycycline (VIBRAMYCIN) 100 mg in sodium chloride 0.9 % 250 mL IVPB 100 mg 125 mL/hr   10/20/19 2146 New Bag/Given   piperacillin-tazobactam (ZOSYN) IVPB 3.375 g 3.375 g 12.5 mL/hr   10/21/19 0146 New Bag/Given   vancomycin (VANCOCIN) IVPB 1000 mg/200 mL premix 1,000 mg 200 mL/hr   10/21/19 0538 New Bag/Given   doxycycline (VIBRAMYCIN) 100 mg in sodium chloride 0.9 % 250 mL IVPB 100 mg 125 mL/hr   10/21/19 0902 New Bag/Given   piperacillin-tazobactam (ZOSYN) IVPB 3.375 g 3.375 g 12.5 mL/hr   10/21/19 1819 New Bag/Given   doxycycline (VIBRAMYCIN) 100 mg in sodium chloride 0.9 % 250 mL IVPB 100 mg 125 mL/hr   10/21/19 2010 New Bag/Given   piperacillin-tazobactam (ZOSYN) IVPB 3.375 g 3.375 g 12.5 mL/hr   10/22/19 0235 New Bag/Given   vancomycin (VANCOCIN) IVPB 1000 mg/200 mL premix 1,000 mg 200 mL/hr   10/22/19 0530 New Bag/Given   doxycycline (VIBRAMYCIN) 100 mg  in sodium chloride 0.9 % 250 mL IVPB 100 mg 125 mL/hr   10/22/19 0532 New Bag/Given   piperacillin-tazobactam (ZOSYN) IVPB 3.375 g 3.375 g 12.5 mL/hr       Objective: Vital signs in last 24 hours: Temp:  [98.1 F (36.7 C)-98.3 F (36.8 C)] 98.3 F (36.8 C) (03/24 0746) Pulse Rate:  [67-86] 77 (03/24 0746) Resp:  [14-18] 18 (03/24 0746) BP: (116-179)/(63-102) 179/100 (03/24 0746) SpO2:  [93 %-94 %] 93 % (03/24 0746) Constitutional:  oriented to person, place, and time. appears well-developed and well-nourished. Mod ill appearing, HENT: Alexander/AT, PERRLA, no scleral icterus Mouth/Throat: Oropharynx is clear and moist. No oropharyngeal exudate.  Cardiovascular: Normal rate, regular rhythm and normal heart sounds.  Pulmonary/Chest: Effort normal and breath sounds normal. No respiratory distress.  has no wheezes.  Neck - supple, no nuchal rigidity Abdominal: Soft.+ TTP diffusely Lymphadenopathy: no cervical adenopathy. No axillary adenopathy Neurological: alert and oriented to person, place, and time.  Skin: Skin is warm and dry. No rash noted. No erythema.  Psychiatric: a normal mood and affect.  behavior is normal.   Lab Results Recent Labs    10/21/19 0432 10/22/19 0440  WBC 4.8 4.1  HGB 8.8* 9.0*  HCT 26.9* 26.8*  NA 143 145  K 3.3* 3.1*  CL 108 110  CO2 28 29  BUN <5* <5*  CREATININE 1.16* 1.18*    Microbiology: Results for orders placed  or performed during the hospital encounter of 10/18/19  Respiratory Panel by RT PCR (Flu A&B, Covid) -     Status: None   Collection Time: 10/18/19  2:41 AM  Result Value Ref Range Status   SARS Coronavirus 2 by RT PCR NEGATIVE NEGATIVE Final    Comment: (NOTE) SARS-CoV-2 target nucleic acids are NOT DETECTED. The SARS-CoV-2 RNA is generally detectable in upper respiratoy specimens during the acute phase of infection. The lowest concentration of SARS-CoV-2 viral copies this assay can detect is 131 copies/mL. A negative result does  not preclude SARS-Cov-2 infection and should not be used as the sole basis for treatment or other patient management decisions. A negative result may occur with  improper specimen collection/handling, submission of specimen other than nasopharyngeal swab, presence of viral mutation(s) within the areas targeted by this assay, and inadequate number of viral copies (<131 copies/mL). A negative result must be combined with clinical observations, patient history, and epidemiological information. The expected result is Negative. Fact Sheet for Patients:  PinkCheek.be Fact Sheet for Healthcare Providers:  GravelBags.it This test is not yet ap proved or cleared by the [place] FDA and  has been authorized for detection and/or diagnosis of SARS-CoV-2 by FDA under an Emergency Use Authorization (EUA). This EUA will remain  in effect (meaning this test can be used) for the duration of the COVID-19 declaration under Section 564(b)(1) of the Act, 21 U.S.C. section 360bbb-3(b)(1), unless the authorization is terminated or revoked sooner.    Influenza A by PCR NEGATIVE NEGATIVE Final   Influenza B by PCR NEGATIVE NEGATIVE Final    Comment: (NOTE) The Xpert Xpress SARS-CoV-2/FLU/RSV assay is intended as an aid in  the diagnosis of influenza from Nasopharyngeal swab specimens and  should not be used as a sole basis for treatment. Nasal washings and  aspirates are unacceptable for Xpert Xpress SARS-CoV-2/FLU/RSV  testing. Fact Sheet for Patients: PinkCheek.be Fact Sheet for Healthcare Providers: GravelBags.it This test is not yet approved or cleared by the [place] FDA and  has been authorized for detection and/or diagnosis of SARS-CoV-2 by  FDA under an Emergency Use Authorization (EUA). This EUA will remain  in effect (meaning this test can be used) for the duration of the   Covid-19 declaration under Section 564(b)(1) of the Act, 21  U.S.C. section 360bbb-3(b)(1), unless the authorization is  terminated or revoked. Performed at [facility], [address] [place], [place] [address]   Culture, blood (routine x 2)     Status: Abnormal   Collection Time: 10/18/19  2:41 AM   Specimen: BLOOD  Result Value Ref Range Status   Specimen Description   Final    BLOOD RIGHT FOREARM Performed at [facility], [address], [place], [place] [address]    Special Requests   Final    BOTTLES DRAWN AEROBIC AND ANAEROBIC Blood Culture adequate volume Performed at [facility], [address] [place], [place] [address]    Culture  Setup Time   Final    Organism ID to follow GRAM POSITIVE COCCI IN BOTH AEROBIC AND ANAEROBIC BOTTLES CRITICAL RESULT CALLED TO, READ BACK BY AND VERIFIED WITH:  ASAJAH DUNCAN ON 10/18/2019 AT 2051 TIK    Culture (A)  Final    STAPHYLOCOCCUS HAEMOLYTICUS SUSCEPTIBILITIES PERFORMED ON PREVIOUS CULTURE WITHIN THE LAST 5 DAYS. Performed at [facility], [place] [address] [place], [place] [address]    Report Status 10/21/2019 FINAL  Final  Culture, blood (  routine x 2)     Status: Abnormal   Collection Time: 10/18/19  2:41 AM   Specimen: BLOOD  Result Value Ref Range Status   Specimen Description   Final    BLOOD LEFT ANTECUBITAL Performed at [facility], [address] [place], [place] [address]    Special Requests   Final    BOTTLES DRAWN AEROBIC AND ANAEROBIC Blood Culture adequate volume Performed at [facility], [address] [place], [place] [address]    Culture  Setup Time   Final    GRAM POSITIVE COCCI ANAEROBIC BOTTLE ONLY CRITICAL RESULT CALLED TO, READ BACK BY AND VERIFIED WITH: [facility] : [facility] DUNCAN AT 1742 10/18/19.PMF Performed at [facility], [address] [place], [place] [address]    Culture STAPHYLOCOCCUS HAEMOLYTICUS (A)  Final   Report Status  10/21/2019 FINAL  Final   Organism ID, Bacteria STAPHYLOCOCCUS HAEMOLYTICUS  Final      Susceptibility   Staphylococcus haemolyticus - MIC*    CIPROFLOXACIN <=0.5 SENSITIVE Sensitive     ERYTHROMYCIN >=8 RESISTANT Resistant     GENTAMICIN <=0.5 SENSITIVE Sensitive     OXACILLIN >=4 RESISTANT Resistant     TETRACYCLINE >=16 RESISTANT Resistant     VANCOMYCIN 1 SENSITIVE Sensitive     TRIMETH/SULFA <=10 SENSITIVE Sensitive     CLINDAMYCIN 1 INTERMEDIATE Intermediate     RIFAMPIN <=0.5 SENSITIVE Sensitive     Inducible Clindamycin NEGATIVE Sensitive     * STAPHYLOCOCCUS HAEMOLYTICUS  Blood Culture ID Panel (Reflexed)     Status: Abnormal   Collection Time: 10/18/19  2:41 AM  Result Value Ref Range Status   Enterococcus species NOT DETECTED NOT DETECTED Final   Listeria monocytogenes NOT DETECTED NOT DETECTED Final   Staphylococcus species DETECTED (A) NOT DETECTED Final    Comment: Methicillin (oxacillin) resistant coagulase negative staphylococcus. Possible blood culture contaminant (unless isolated from more than one blood culture draw or clinical case suggests pathogenicity). No antibiotic treatment is indicated for blood  culture contaminants. CRITICAL RESULT CALLED TO, READ BACK BY AND VERIFIED WITH: ASAJAH DUNCAN ON 10/18/2019 AT 2051 TIK    Staphylococcus aureus (BCID) NOT DETECTED NOT DETECTED Final   Methicillin resistance DETECTED (A) NOT DETECTED Final    Comment: CRITICAL RESULT CALLED TO, READ BACK BY AND VERIFIED WITH: ASAJAH DUNCAN ON 10/18/2019 AT 2051 TIK    Streptococcus species NOT DETECTED NOT DETECTED Final   Streptococcus agalactiae NOT DETECTED NOT DETECTED Final   Streptococcus pneumoniae NOT DETECTED NOT DETECTED Final   Streptococcus pyogenes NOT DETECTED NOT DETECTED Final   Acinetobacter baumannii NOT DETECTED NOT DETECTED Final   Enterobacteriaceae species NOT DETECTED NOT DETECTED Final   Enterobacter cloacae complex NOT DETECTED NOT DETECTED Final    Escherichia coli NOT DETECTED NOT DETECTED Final   Klebsiella oxytoca NOT DETECTED NOT DETECTED Final   Klebsiella pneumoniae NOT DETECTED NOT DETECTED Final   Proteus species NOT DETECTED NOT DETECTED Final   Serratia marcescens NOT DETECTED NOT DETECTED Final   Haemophilus influenzae NOT DETECTED NOT DETECTED Final   Neisseria meningitidis NOT DETECTED NOT DETECTED Final   Pseudomonas aeruginosa NOT DETECTED NOT DETECTED Final   Candida albicans NOT DETECTED NOT DETECTED Final   Candida glabrata NOT DETECTED NOT DETECTED Final   Candida krusei NOT DETECTED NOT DETECTED Final   Candida parapsilosis NOT DETECTED NOT DETECTED Final   Candida tropicalis NOT DETECTED NOT DETECTED Final    Comment: Performed at [facility], [address]  [place], [place] [address]  MRSA PCR Screening     Status: None   Collection Time: 10/18/19  6:44 PM   Specimen: Nasopharyngeal  Result Value Ref Range Status   MRSA by PCR NEGATIVE NEGATIVE Final    Comment:        The GeneXpert MRSA Assay (FDA approved for NASAL specimens only), is one component of a comprehensive MRSA colonization surveillance program. It is not intended to diagnose MRSA infection nor to guide or monitor treatment for MRSA infections. Performed at [facility], [address] [place], [place] [address]   Culture, blood (Routine X 2) w Reflex to ID Panel     Status: None (Preliminary result)   Collection Time: 10/20/19  2:58 PM   Specimen: BLOOD  Result Value Ref Range Status   Specimen Description BLOOD BLOOD LEFT HAND  Final   Special Requests   Final    BOTTLES DRAWN AEROBIC AND ANAEROBIC Blood Culture adequate volume   Culture   Final    NO GROWTH 2 DAYS Performed at [facility], [address] [place], [place] [address]    Report Status PENDING  Incomplete  Culture, blood (Routine X 2) w Reflex to ID Panel     Status: None (Preliminary result)   Collection Time: 10/20/19  3:05 PM    Specimen: BLOOD  Result Value Ref Range Status   Specimen Description BLOOD BLOOD RIGHT HAND  Final   Special Requests   Final    BOTTLES DRAWN AEROBIC AND ANAEROBIC Blood Culture adequate volume   Culture   Final    NO GROWTH 2 DAYS Performed at [facility], [address] [place], [place] [address]    Report Status PENDING  Incomplete    Studies/Results: CT ABDOMEN PELVIS W CONTRAST  Result Date: 10/22/2019 CLINICAL DATA:  Diverticulitis follow up EXAM: CT ABDOMEN AND PELVIS WITH CONTRAST TECHNIQUE: Multidetector CT imaging of the abdomen and pelvis was performed using the standard protocol following bolus administration of intravenous contrast. CONTRAST:  100mL OMNIPAQUE IOHEXOL 300 MG/ML  SOLN COMPARISON:  10/18/2019 FINDINGS: Lower chest: New small to moderate bilateral pleural effusions with associated passive atelectasis. Hepatobiliary: Distended gallbladder with suspected Phrygian cap. Gallbladder wall thickening versus pericholecystic fluid. No focal hepatic parenchymal lesion is identified. Pancreas: Unremarkable Spleen: Unremarkable Adrenals/Urinary Tract: Unremarkable Stomach/Bowel: There is a collection of abscesses resembling a string of beads extending from a sigmoid colon paracolic abscess with gas and fluid components on images 49 through 54 series 2, up along adjacent loops of small bowel. Whereas previously there was simply loculations of gas along the small bowel, there is now a more confluent elongated abscess measuring 6.8 by 1.7 by 1.7 cm (volume = 10 cm^3) on image 77/5. The portion of the abscess adjacent to the sigmoid colon is multilobulated but measures about 3.5 by 1.5 by 2.3 cm (volume = 6.3 cm^3). There are several additional tiny loculations of gas along the regional mesentery. Overall the appearance is worsened from 10/18/2019. Vascular/Lymphatic: Small reactive retroperitoneal and pelvic lymph nodes. Reproductive: Suspected small anterior fundal fibroid, a  bowel 1.4 cm in diameter. Other: Increased stranding/edema in the mesentery. Trace free fluid along the right hepatic lobe margin similar to prior. Musculoskeletal: Unremarkable IMPRESSION: 1. Paracolic abscess along the sigmoid colon is itself similar in size to the prior exam, but there is an enlarging elongated abscess tracking along adjacent loops of small bowel currently 10 cubic cm in volume, with increased surrounding mesenteric edema. This abscess  along the small bowel is notably increased from prior. 2. New small to moderate bilateral pleural effusions with associated passive atelectasis. 3. Distended gallbladder with wall thickening versus pericholecystic fluid. 4. Suspected small anterior fundal fibroid. 5. Trace free fluid along the right hepatic lobe margin, similar to prior. Electronically Signed   By: Gaylyn Rong M.D.   On: 10/22/2019 11:14    Assessment/Plan: Atziry Baranski is a 44 y.o. female femalewith medical history significant forrecurrent diverticulitis, recently hospitalized under the surgical service for treatment of diverticulitis with perforation and abscess, treated conservatively with percutaneous drains, removed on 09/29/2019 and finished oral cipro flagy about a week ago now with recurrent diverticulitis and bcx + 2/2 sets Staph haemolyticus 3/34- no fevers ,wbc down. Still nausea. Repeat imaging noted. Recommendations Continue zosyn for intrabdominal infection. Monitor wbc and fever curve. Discussed case with surgery. For the bacteremia- I suspect contaminant as no PICC line in place, no skin and soft tissue infections, no UTI sxs and UA neg.  However will repeat bcx and cont vanco pending follow up.   If BX neg at 76 hours can dc vanco- would have received about 7 days treatment  Thank you very much for the consult. Will follow with you.  Mick Sell   10/22/2019, 1:35 PM

## 2019-10-22 NOTE — Progress Notes (Signed)
[facility] SURGICAL PROGRESS NOTE (cpt [phone])  Hospital Day(s): 4.   Interval History: Patient seen and examined, no acute events or new complaints overnight. Patient reports her abdominal pain is mildly improved although she continues to have persistent nausea and emesis. No fever or chills. WBC remains WNL at 4 today. Mild hypokalemia at 3.1. Plan for repeat imaging today.   Review of Systems:  Constitutional: denies fever, chills  HEENT: denies cough or congestion  Respiratory: denies any shortness of breath  Cardiovascular: denies chest pain or palpitations  Gastrointestinal: + abdominal pain (improved), + Nausea, + Vomiting, denied diarrhea/and bowel function as per interval history Genitourinary: denies burning with urination or urinary frequency   Vital signs in last 24 hours: [min-max] current  Temp:  [98.1 F (36.7 C)-98.4 F (36.9 C)] 98.1 F (36.7 C) (03/24 0051) Pulse Rate:  [67-Sara] Sara (03/24 0051) Resp:  [0-17] 14 (03/24 0051) BP: (116-175)/(63-102) 175/102 (03/24 0051) SpO2:  [89 %-100 %] 94 % (03/24 0051)     Height: 4\' 11"  (149.9 cm) Weight: 71.9 kg BMI (Calculated): 32   Intake/Output last 2 shifts:  03/23 0701 - 03/24 0700 In: 893.3 [I.V.:770.4; IV Piggyback:122.8] Out: 100 [Emesis/NG output:100]   Physical Exam:  Constitutional: alert, cooperative and no distress  HENT: normocephalic without obvious abnormality  Eyes: PERRL, EOM's grossly intact and symmetric  Respiratory: breathing non-labored at rest  Cardiovascular: regular rate and sinus rhythm  Gastrointestinal: Soft, fairly generalized tenderness, and non-distended, no rebound/guarding, certainly not peritonitic Musculoskeletal: no edema or wounds, motor and sensation grossly intact, NT    Labs:  CBC Latest Ref Rng & Units 10/22/2019 10/21/2019 10/20/2019  WBC 4.0 - 10.5 K/uL 4.1 4.8 9.5  Hemoglobin 12.0 - 15.0 g/dL 9.0(L) 8.8(L) 9.8(L)  Hematocrit 36.0 - 46.0 % 26.8(L) 26.9(L)  30.6(L)  Platelets 150 - 400 K/uL 219 198 217   CMP Latest Ref Rng & Units 10/22/2019 10/21/2019 10/20/2019  Glucose 70 - 99 mg/dL 10/22/2019) 527(P) 824(M)  BUN 6 - 20 mg/dL 353(I) <1(W) 7  Creatinine 0.44 - 1.00 mg/dL <4(R) 1.54(M) 0.Sara(P)  Sodium 135 - 145 mmol/L 145 143 139  Potassium 3.5 - 5.1 mmol/L 3.1(L) 3.3(L) 3.3(L)  Chloride 98 - 111 mmol/L 110 108 104  CO2 22 - 32 mmol/L 29 28 28   Calcium 8.9 - 10.3 mg/dL 6.19(J) ) 7.9(L)  Total Protein 6.5 - 8.1 g/dL - - -  Total Bilirubin 0.3 - 1.2 mg/dL - - -  Alkaline Phos 38 - 126 U/L - - -  AST 15 - 41 U/L - - -  ALT 0 - 44 U/L - - -     Imaging studies: No new pertinent imaging studies, CT Abdomen/Pelvis today pending   Assessment/Plan: (ICD-10's: K16.92) 44 y.o. Chapman with clinical improvement and resolution of leukocytosis attributable to recurrent -vs- unresolved diverticulitis with extraluminal air however this was small and likely from previous percutaneous drain, although perforation is also plausible.    - Continue CLD as tolerated for now             - Will repeat CT Abdomen/Pelvis today to reassess for worsening extraluminal air, abscess, or worsening inflammation.               - Continue IV Abx (Zosyn + Vancomycin); ID also involved             - Monitor labs; improving             - Pain control prn;  antiemetics prn             - serial abdominal examination             - I do not think she warrants any emergent surgical intervention; I think it is reasonable to continue conservative management as it is unclear if she ever recovered from her initial episode of diverticulitis             - Mobilization encouraged              - Further management per primary team  All of the above findings and recommendations were discussed with the patient, and the medical team, and all of patient's questions were answered to her expressed satisfaction.  -- Edison Simon, PA-C [facility] 10/22/2019, 7:17  AM [phone] M-F: 7am - 4pm

## 2019-10-22 NOTE — Progress Notes (Signed)
Pt's 12am fingerstick was 97. Results did not flow over into epic.

## 2019-10-22 NOTE — Progress Notes (Signed)
Called to patient bedside regarding patients concerns about her medications Patient concerns with amount of fluids receiving with meds and maintenance fluids.  She also expressed anxiety, fear, notably crying,  About her ongoing treatments and illness.  She also reports inability to sleep because of her anxiety, fear and due to amount of fluids is up most of night to void.  Review of labs/charts - docuumented 12L + fluid balance and hypertensive.  Maintenace IV fluids decreased.  Low dose xanax ordered to allow for patient to rest and ask for husband to be allowed to stay with patient during the night as she stated helps control her anxiety and dealing with similar stressful situations in the past.  I believe this will be of great benefit to her recovery.

## 2019-10-23 ENCOUNTER — Ambulatory Visit: Payer: BC Managed Care – PPO | Admitting: Surgery

## 2019-10-23 DIAGNOSIS — E876 Hypokalemia: Secondary | ICD-10-CM

## 2019-10-23 DIAGNOSIS — N179 Acute kidney failure, unspecified: Secondary | ICD-10-CM

## 2019-10-23 DIAGNOSIS — K651 Peritoneal abscess: Secondary | ICD-10-CM

## 2019-10-23 LAB — BASIC METABOLIC PANEL
Anion gap: 8 (ref 5–15)
BUN: <5 mg/dL — ABNORMAL LOW (ref 6–20)
CO2: 24 mmol/L (ref 22–32)
Calcium: 8.5 mg/dL — ABNORMAL LOW (ref 8.9–10.3)
Chloride: 109 mmol/L (ref 98–111)
Creatinine, Ser: 1.27 mg/dL — ABNORMAL HIGH (ref 0.44–1.00)
GFR calc Af Amer: 59 mL/min — ABNORMAL LOW (ref >60)
GFR calc non Af Amer: 51 mL/min — ABNORMAL LOW (ref >60)
Glucose, Bld: 111 mg/dL — ABNORMAL HIGH (ref 70–99)
Potassium: 3 mmol/L — ABNORMAL LOW (ref 3.5–5.1)
Sodium: 141 mmol/L (ref 135–145)

## 2019-10-23 LAB — VANCOMYCIN, PEAK: Vancomycin Pk: 51 ug/mL (ref 30–40)

## 2019-10-23 LAB — VANCOMYCIN, TROUGH: Vancomycin Tr: 15 ug/mL (ref 15–20)

## 2019-10-23 LAB — MAGNESIUM: Magnesium: 1.5 mg/dL — ABNORMAL LOW (ref 1.7–2.4)

## 2019-10-23 LAB — PHOSPHORUS: Phosphorus: 3.4 mg/dL (ref 2.5–4.6)

## 2019-10-23 MED ORDER — POTASSIUM CHLORIDE CRYS ER 20 MEQ PO TBCR
40.0000 meq | EXTENDED_RELEASE_TABLET | Freq: Once | ORAL | Status: AC
  Administered 2019-10-23: 10:00:00 40 meq via ORAL
  Filled 2019-10-23: qty 2

## 2019-10-23 MED ORDER — METOCLOPRAMIDE HCL 5 MG/ML IJ SOLN
5.0000 mg | Freq: Three times a day (TID) | INTRAMUSCULAR | Status: DC | PRN
  Administered 2019-10-25: 5 mg via INTRAVENOUS
  Filled 2019-10-23: qty 2

## 2019-10-23 MED ORDER — MAGNESIUM SULFATE 4 GM/100ML IV SOLN
4.0000 g | Freq: Once | INTRAVENOUS | Status: AC
  Administered 2019-10-23: 10:00:00 4 g via INTRAVENOUS
  Filled 2019-10-23: qty 100

## 2019-10-23 MED ORDER — VANCOMYCIN HCL IN DEXTROSE 1-5 GM/200ML-% IV SOLN
1000.0000 mg | INTRAVENOUS | Status: DC
  Filled 2019-10-23: qty 200

## 2019-10-23 MED ORDER — VANCOMYCIN HCL 750 MG/150ML IV SOLN
750.0000 mg | INTRAVENOUS | Status: DC
  Administered 2019-10-24 – 2019-10-26 (×4): 750 mg via INTRAVENOUS
  Filled 2019-10-23 (×6): qty 150

## 2019-10-23 MED ORDER — BOOST / RESOURCE BREEZE PO LIQD CUSTOM
1.0000 | Freq: Three times a day (TID) | ORAL | Status: DC
  Administered 2019-10-23 – 2019-10-24 (×3): 1 via ORAL

## 2019-10-23 NOTE — Progress Notes (Signed)
PROGRESS NOTE                                                                                                                                                                                                             Patient Demographics:    Sara Chapman, is a 44 y.o. female, DOB - [birth year], PIR:[id number]  Admit date - 10/18/2019   Admitting Physician Andris Baumann, MD  Outpatient Primary MD for the patient is Cathie Hoops, PA  LOS - 5  Outpatient Specialists:   Chief Complaint  Patient presents with  . Abdominal Pain       Brief Narrative 44 year old obese female with recurrent diverticulitis (recent hospitalization for diverticulitis with perforation and abscess which was treated with percutaneous drain that was removed on 09/29/2019) presented to the ED with acute onset of left lower quadrant pain with vomiting. Patient admitted for sepsis (fever, lactic acid 2.5, leukocytosis, tachycardia and tachypnea) with recurrent diverticulitis.   Subjective:   Reports having a rough night with headache, nausea and vomiting and feeling very anxious with her ongoing symptoms.  Tolerating some clears this morning.   Assessment  & Plan :    Principal Problem: Acute diverticulitis, recurrent Sepsis on admission as resolved.   Continue empiric IV Zosyn.  IV vancomycin for possible bacteremia which is suspicious for contaminant. Continue clears and monitoring with antibiotic to avoid surgery if possible. Gentle IV hydration.  As needed IV morphine for pain. Repeat CT abdomen pelvis on 3/24 showing paracolic abscess with enlarged elevated abscess tracking along the loops of small bowel.  General surgery following and will decide on 3/26 regarding operative management versus continued conservative treatment.  Continue serial abdominal exam, replenished low potassium and magnesium.  Encourage p.o. intake and  ambulation.     Active problems Recent tick bite over left upper back On IV doxycycline day 4/5 per ID.  ?  Staph hemolyticus bacteremia Suspect contaminant.  Follow repeat blood cultures.  Will discontinue vancomycin if no growth.  Acute kidney injury Mild possibly with dehydration.  Continue gentle hydration monitor.  Headaches and nausea Added as needed Reglan  Obesity (BMI 32 kg/m)   Code Status : Full code  Family Communication  : Husband involved in care.  We will update.  Disposition Plan  : Pending hospital course   Barriers For  Discharge : Active diverticulitis  Consults  : Surgery  Procedures  : CT abdomen  DVT Prophylaxis  : SCDs (surgery recommends holding DVT prophylaxis in anticipation for surgery)  Lab Results  Component Value Date   PLT 219 10/22/2019    Antibiotics  :    Anti-infectives (From admission, onward)   Start     Dose/Rate Route Frequency Ordered Stop   10/20/19 1800  doxycycline (VIBRAMYCIN) 100 mg in sodium chloride 0.9 % 250 mL IVPB     100 mg 125 mL/hr over 120 Minutes Intravenous Every 12 hours 10/20/19 1641 10/25/19 1759   10/20/19 0200  vancomycin (VANCOCIN) IVPB 1000 mg/200 mL premix  Status:  Discontinued     1,000 mg 200 mL/hr over 60 Minutes Intravenous Every 24 hours 10/19/19 0347 10/23/19 1019   10/18/19 2330  vancomycin (VANCOREADY) IVPB 1750 mg/350 mL  Status:  Discontinued     1,750 mg 175 mL/hr over 120 Minutes Intravenous Every 8 hours 10/18/19 2227 10/18/19 2251   10/18/19 2330  vancomycin (VANCOREADY) IVPB 1750 mg/350 mL     1,750 mg 175 mL/hr over 120 Minutes Intravenous  Once 10/18/19 2251 10/19/19 0254   10/18/19 0600  piperacillin-tazobactam (ZOSYN) IVPB 3.375 g     3.375 g 12.5 mL/hr over 240 Minutes Intravenous Every 8 hours 10/18/19 0352     10/18/19 0245  piperacillin-tazobactam (ZOSYN) IVPB 3.375 g     3.375 g 100 mL/hr over 30 Minutes Intravenous  Once 10/18/19 0232 10/18/19 0330         Objective:   Vitals:   10/22/19 1608 10/22/19 1738 10/23/19 0031 10/23/19 0734  BP: (!) 195/91 (!) 145/82 137/85 (!) 144/91  Pulse:  94 72 77  Resp:   18 18  Temp:   97.9 F (36.6 C) 98.4 F (36.9 C)  TempSrc:   Oral Oral  SpO2:   94% 96%  Weight:      Height:        Wt Readings from Last 3 Encounters:  10/19/19 71.9 kg  09/30/19 69.2 kg  09/23/19 68.5 kg     Intake/Output Summary (Last 24 hours) at 10/23/2019 1119 Last data filed at 10/23/2019 0302 Gross per 24 hour  Intake 1513.02 ml  Output --  Net 1513.02 ml    Physical exam Middle aged female not in distress, fatigue HEENT: Moist mucosa, supple neck Chest: Clear CVs: Normal S1-S2 GI: Soft, nondistended, bowel sounds present, tender to pressure over the bilateral lower quadrant (unchanged from yesterday) Musculoskeletal: Warm, no edema     Data Review:    CBC Recent Labs  Lab 10/18/19 0421 10/19/19 0537 10/20/19 0240 10/21/19 0432 10/22/19 0440  WBC 12.1* 12.1* 9.5 4.8 4.1  HGB 11.5* 11.4* 9.8* 8.8* 9.0*  HCT 34.4* 35.1* 30.6* 26.9* 26.8*  PLT 237 224 217 198 219  MCV 93.5 94.6 95.0 95.1 91.8  MCH 31.3 30.7 30.4 31.1 30.8  MCHC 33.4 32.5 32.0 32.7 33.6  RDW 13.1 13.1 13.0 12.7 12.7  LYMPHSABS 1.0  --   --   --   --   MONOABS 0.4  --   --   --   --   EOSABS 0.0  --   --   --   --   BASOSABS 0.0  --   --   --   --     Chemistries  Recent Labs  Lab 10/17/19 2347 10/17/19 2347 10/19/19 0537 10/20/19 0240 10/21/19 0432 10/22/19 0440 10/23/19 0413  NA 141   < >  137 139 143 145 141  K 3.8   < > 3.6 3.3* 3.3* 3.1* 3.0*  CL 107   < > 103 104 108 110 109  CO2 26   < > GLUCOSE 120*   < > 90 114* 108* 116* 111*  BUN 12   < > 9 7 <5* <5* <5*  CREATININE 1.05*   < > 1.04* 1.20* 1.16* 1.18* 1.27*  CALCIUM 8.5*   < > 8.1* 7.9* 8.0* 8.4* 8.5*  MG  --   --   --   --   --   --  1.5*  AST 21  --   --   --   --  11*  --   ALT 18  --   --   --   --  10  --   ALKPHOS 72  --   --   --    --  94  --   BILITOT 0.8  --   --   --   --  0.6  --    < > = values in this interval not displayed.   ------------------------------------------------------------------------------------------------------------------ No results for input(s): CHOL, HDL, LDLCALC, TRIG, CHOLHDL, LDLDIRECT in the last 72 hours.  No results found for: HGBA1C ------------------------------------------------------------------------------------------------------------------ No results for input(s): TSH, T4TOTAL, T3FREE, THYROIDAB in the last 72 hours.  Invalid input(s): FREET3 ------------------------------------------------------------------------------------------------------------------ No results for input(s): VITAMINB12, FOLATE, FERRITIN, TIBC, IRON, RETICCTPCT in the last 72 hours.  Coagulation profile Recent Labs  Lab 10/18/19 0421  INR 1.1    No results for input(s): DDIMER in the last 72 hours.  Cardiac Enzymes No results for input(s): CKMB, TROPONINI, MYOGLOBIN in the last 168 hours.  Invalid input(s): CK ------------------------------------------------------------------------------------------------------------------ No results found for: BNP  Inpatient Medications  Scheduled Meds: . topiramate  100 mg Oral BID   Continuous Infusions: . sodium chloride Stopped (10/23/19 0256)  . dextrose 5 % and 0.45 % NaCl with KCl 20 mEq/L 50 mL/hr at 10/23/19 0548  . doxycycline (VIBRAMYCIN) IV 100 mg (10/23/19 0546)  . magnesium sulfate bolus IVPB 4 g (10/23/19 1029)  . piperacillin-tazobactam 3.375 g (10/23/19 0756)   PRN Meds:.sodium chloride, acetaminophen **OR** acetaminophen, ALPRAZolam, hydrALAZINE, metoCLOPramide (REGLAN) injection, morphine injection, ondansetron **OR** ondansetron (ZOFRAN) IV  Micro Results Recent Results (from the past 240 hour(s))  Respiratory Panel by RT PCR (Flu A&B, Covid) -     Status: None   Collection Time: 10/18/19  2:41 AM  Result Value Ref Range Status    SARS Coronavirus 2 by RT PCR NEGATIVE NEGATIVE Final    Comment: (NOTE) SARS-CoV-2 target nucleic acids are NOT DETECTED. The SARS-CoV-2 RNA is generally detectable in upper respiratoy specimens during the acute phase of infection. The lowest concentration of SARS-CoV-2 viral copies this assay can detect is 131 copies/mL. A negative result does not preclude SARS-Cov-2 infection and should not be used as the sole basis for treatment or other patient management decisions. A negative result may occur with  improper specimen collection/handling, submission of specimen other than nasopharyngeal swab, presence of viral mutation(s) within the areas targeted by this assay, and inadequate number of viral copies (<131 copies/mL). A negative result must be combined with clinical observations, patient history, and epidemiological information. The expected result is Negative. Fact Sheet for Patients:  https://www.moore.com/ Fact Sheet for Healthcare Providers:  https://www.young.biz/ This test is not yet ap proved or cleared by the [place] FDA and  has been authorized for detection  and/or diagnosis of SARS-CoV-2 by FDA under an Emergency Use Authorization (EUA). This EUA will remain  in effect (meaning this test can be used) for the duration of the COVID-19 declaration under Section 564(b)(1) of the Act, 21 U.S.C. section 360bbb-3(b)(1), unless the authorization is terminated or revoked sooner.    Influenza A by PCR NEGATIVE NEGATIVE Final   Influenza B by PCR NEGATIVE NEGATIVE Final    Comment: (NOTE) The Xpert Xpress SARS-CoV-2/FLU/RSV assay is intended as an aid in  the diagnosis of influenza from Nasopharyngeal swab specimens and  should not be used as a sole basis for treatment. Nasal washings and  aspirates are unacceptable for Xpert Xpress SARS-CoV-2/FLU/RSV  testing. Fact Sheet for Patients: https://www.moore.com/https://www.fda.gov/media/142436/download Fact  Sheet for Healthcare Providers: https://www.young.biz/https://www.fda.gov/media/142435/download This test is not yet approved or cleared by the [place] FDA and  has been authorized for detection and/or diagnosis of SARS-CoV-2 by  FDA under an Emergency Use Authorization (EUA). This EUA will remain  in effect (meaning this test can be used) for the duration of the  Covid-19 declaration under Section 564(b)(1) of the Act, 21  U.S.C. section 360bbb-3(b)(1), unless the authorization is  terminated or revoked. Performed at [facility], [address] [place], [place] [address]   Culture, blood (routine x 2)     Status: Abnormal   Collection Time: 10/18/19  2:41 AM   Specimen: BLOOD  Result Value Ref Range Status   Specimen Description   Final    BLOOD RIGHT FOREARM Performed at [facility], [address] [place], [place] [address]    Special Requests   Final    BOTTLES DRAWN AEROBIC AND ANAEROBIC Blood Culture adequate volume Performed at [facility], [address] [place], [place] [address]    Culture  Setup Time   Final    Organism ID to follow GRAM POSITIVE COCCI IN BOTH AEROBIC AND ANAEROBIC BOTTLES CRITICAL RESULT CALLED TO, READ BACK BY AND VERIFIED WITH:  ASAJAH DUNCAN ON 10/18/2019 AT 2051 TIK    Culture (A)  Final    STAPHYLOCOCCUS HAEMOLYTICUS SUSCEPTIBILITIES PERFORMED ON PREVIOUS CULTURE WITHIN THE LAST 5 DAYS. Performed at [facility], [address] [place], [place] [address]    Report Status 10/21/2019 FINAL  Final  Culture, blood (routine x 2)     Status: Abnormal   Collection Time: 10/18/19  2:41 AM   Specimen: BLOOD  Result Value Ref Range Status   Specimen Description   Final    BLOOD LEFT ANTECUBITAL Performed at [facility], [address] [place], [place] [address]    Special Requests   Final    BOTTLES DRAWN AEROBIC AND ANAEROBIC Blood Culture adequate volume Performed at [facility], [address] [place], [place] [address]    Culture  Setup Time   Final    GRAM POSITIVE COCCI ANAEROBIC BOTTLE ONLY CRITICAL RESULT CALLED TO, READ BACK BY AND VERIFIED WITH: [facility] DUNC[facility] AT 1742 10/18/19.PMF Performed at [facility], [address] [place], [place] [address]    Culture STAPHYLOCOCCUS HAEMOLYTICUS (A)  Final   Report Status 10/21/2019 FINAL  Final   Organism ID, Bacteria STAPHYLOCOCCUS HAEMOLYTICUS  Final      Susceptibility   Staphylococcus haemolyticus - MIC*    CIPROFLOXACIN <=0.5 SENSITIVE Sensitive     ERYTHROMYCIN >=8 RESISTANT Resistant     GENTAMICIN <=0.5 SENSITIVE Sensitive     OXACILLIN >=4 RESISTANT Resistant     TETRACYCLINE >=  16 RESISTANT Resistant     VANCOMYCIN 1 SENSITIVE Sensitive     TRIMETH/SULFA <=10 SENSITIVE Sensitive     CLINDAMYCIN 1 INTERMEDIATE Intermediate     RIFAMPIN <=0.5 SENSITIVE Sensitive     Inducible Clindamycin NEGATIVE Sensitive     * STAPHYLOCOCCUS HAEMOLYTICUS  Blood Culture ID Panel (Reflexed)     Status: Abnormal   Collection Time: 10/18/19  2:41 AM  Result Value Ref Range Status   Enterococcus species NOT DETECTED NOT DETECTED Final   Listeria monocytogenes NOT DETECTED NOT DETECTED Final   Staphylococcus species DETECTED (A) NOT DETECTED Final    Comment: Methicillin (oxacillin) resistant coagulase negative staphylococcus. Possible blood culture contaminant (unless isolated from more than one blood culture draw or clinical case suggests pathogenicity). No antibiotic treatment is indicated for blood  culture contaminants. CRITICAL RESULT CALLED TO, READ BACK BY AND VERIFIED WITH: ASAJAH DUNCAN ON 10/18/2019 AT 2051 TIK    Staphylococcus aureus (BCID) NOT DETECTED NOT DETECTED Final   Methicillin resistance DETECTED (A) NOT DETECTED Final    Comment: CRITICAL RESULT CALLED TO, READ BACK BY AND VERIFIED WITH: ASAJAH DUNCAN ON 10/18/2019 AT 2051 TIK    Streptococcus species NOT DETECTED NOT DETECTED Final    Streptococcus agalactiae NOT DETECTED NOT DETECTED Final   Streptococcus pneumoniae NOT DETECTED NOT DETECTED Final   Streptococcus pyogenes NOT DETECTED NOT DETECTED Final   Acinetobacter baumannii NOT DETECTED NOT DETECTED Final   Enterobacteriaceae species NOT DETECTED NOT DETECTED Final   Enterobacter cloacae complex NOT DETECTED NOT DETECTED Final   Escherichia coli NOT DETECTED NOT DETECTED Final   Klebsiella oxytoca NOT DETECTED NOT DETECTED Final   Klebsiella pneumoniae NOT DETECTED NOT DETECTED Final   Proteus species NOT DETECTED NOT DETECTED Final   Serratia marcescens NOT DETECTED NOT DETECTED Final   Haemophilus influenzae NOT DETECTED NOT DETECTED Final   Neisseria meningitidis NOT DETECTED NOT DETECTED Final   Pseudomonas aeruginosa NOT DETECTED NOT DETECTED Final   Candida albicans NOT DETECTED NOT DETECTED Final   Candida glabrata NOT DETECTED NOT DETECTED Final   Candida krusei NOT DETECTED NOT DETECTED Final   Candida parapsilosis NOT DETECTED NOT DETECTED Final   Candida tropicalis NOT DETECTED NOT DETECTED Final    Comment: Performed at [facility], [address] [place], [place] [address]  MRSA PCR Screening     Status: None   Collection Time: 10/18/19  6:44 PM   Specimen: Nasopharyngeal  Result Value Ref Range Status   MRSA by PCR NEGATIVE NEGATIVE Final    Comment:        The GeneXpert MRSA Assay (FDA approved for NASAL specimens only), is one component of a comprehensive MRSA colonization surveillance program. It is not intended to diagnose MRSA infection nor to guide or monitor treatment for MRSA infections. Performed at [facility], [address] [place], [place] [address]   Culture, blood (Routine X 2) w Reflex to ID Panel     Status: None (Preliminary result)   Collection Time: 10/20/19  2:58 PM   Specimen: BLOOD  Result Value Ref Range Status   Specimen Description BLOOD BLOOD LEFT HAND  Final   Special Requests   Final     BOTTLES DRAWN AEROBIC AND ANAEROBIC Blood Culture adequate volume   Culture   Final    NO GROWTH 3 DAYS Performed at [facility], [address] [place], [place] [address]    Report Status PENDING  Incomplete  Culture, blood (Routine X  2) w Reflex to ID Panel     Status: None (Preliminary result)   Collection Time: 10/20/19  3:05 PM   Specimen: BLOOD  Result Value Ref Range Status   Specimen Description BLOOD BLOOD RIGHT HAND  Final   Special Requests   Final    BOTTLES DRAWN AEROBIC AND ANAEROBIC Blood Culture adequate volume   Culture   Final    NO GROWTH 3 DAYS Performed at [facility], [address], [place], [place] [address]    Report Status PENDING  Incomplete    Radiology Reports CT ABDOMEN PELVIS W CONTRAST  Result Date: 10/22/2019 CLINICAL DATA:  Diverticulitis follow up EXAM: CT ABDOMEN AND PELVIS WITH CONTRAST TECHNIQUE: Multidetector CT imaging of the abdomen and pelvis was performed using the standard protocol following bolus administration of intravenous contrast. CONTRAST:  OMNIPAQUE IOHEXOL 300 MG/ML  SOLN COMPARISON:  10/18/2019 FINDINGS: Lower chest: New small to moderate bilateral pleural effusions with associated passive atelectasis. Hepatobiliary: Distended gallbladder with suspected Phrygian cap. Gallbladder wall thickening versus pericholecystic fluid. No focal hepatic parenchymal lesion is identified. Pancreas: Unremarkable Spleen: Unremarkable Adrenals/Urinary Tract: Unremarkable Stomach/Bowel: There is a collection of abscesses resembling a string of beads extending from a sigmoid colon paracolic abscess with gas and fluid components on images 49 through 54 series 2, up along adjacent loops of small bowel. Whereas previously there was simply loculations of gas along the small bowel, there is now a more confluent elongated abscess measuring 6.8 by 1.7 by 1.7 cm (volume = 10 cm^3) on image 77/5. The portion of the abscess adjacent to  the sigmoid colon is multilobulated but measures about 3.5 by 1.5 by 2.3 cm (volume = 6.3 cm^3). There are several additional tiny loculations of gas along the regional mesentery. Overall the appearance is worsened from 10/18/2019. Vascular/Lymphatic: Small reactive retroperitoneal and pelvic lymph nodes. Reproductive: Suspected small anterior fundal fibroid, a bowel 1.4 cm in diameter. Other: Increased stranding/edema in the mesentery. Trace free fluid along the right hepatic lobe margin similar to prior. Musculoskeletal: Unremarkable IMPRESSION: 1. Paracolic abscess along the sigmoid colon is itself similar in size to the prior exam, but there is an enlarging elongated abscess tracking along adjacent loops of small bowel currently 10 cubic cm in volume, with increased surrounding mesenteric edema. This abscess along the small bowel is notably increased from prior. 2. New small to moderate bilateral pleural effusions with associated passive atelectasis. 3. Distended gallbladder with wall thickening versus pericholecystic fluid. 4. Suspected small anterior fundal fibroid. 5. Trace free fluid along the right hepatic lobe margin, similar to prior. Electronically Signed   By: Gaylyn Rong M.D.   On: 10/22/2019 11:14   CT ABDOMEN PELVIS W CONTRAST  Result Date: 10/18/2019 CLINICAL DATA:  Abdominal distension EXAM: CT ABDOMEN AND PELVIS WITH CONTRAST TECHNIQUE: Multidetector CT imaging of the abdomen and pelvis was performed using the standard protocol following bolus administration of intravenous contrast. CONTRAST:  OMNIPAQUE IOHEXOL 300 MG/ML  SOLN COMPARISON:  09/29/2019 FINDINGS: LOWER CHEST: Normal. HEPATOBILIARY: Normal hepatic contours. No intra- or extrahepatic biliary dilatation. Status post cholecystectomy. PANCREAS: Normal pancreas. No ductal dilatation or peripancreatic fluid collection. SPLEEN: Normal. ADRENALS/URINARY TRACT: The adrenal glands are normal. No hydronephrosis,  nephroureterolithiasis or solid renal mass. The urinary bladder is normal for degree of distention STOMACH/BOWEL: There is a small amounts of gas again seen in the left mid abdomen at the site of the now removed percutaneous drain. There is mild stranding surrounding the left upper  quadrant small bowel and the junction of the sigmoid and descending colon. The degree of inflammatory changes greater than on the prior study. There is no discrete fluid collection. VASCULAR/LYMPHATIC: Normal course and caliber of the major abdominal vessels. No abdominal or pelvic lymphadenopathy. REPRODUCTIVE: Multiple uterine fibroids. MUSCULOSKELETAL. No bony spinal canal stenosis or focal osseous abnormality. OTHER: None. IMPRESSION: Increased inflammatory change surrounding the left upper quadrant small bowel and the junction of the sigmoid and descending colon, likely indicating worsening/recurrent diverticulitis. Small amounts of free air in the left mid abdomen may have been introduced by the drainage catheter, but recurrent perforation could also cause this appearance. Electronically Signed   By: Ulyses Jarred M.D.   On: 10/18/2019 01:33   CT Abdomen Pelvis W Contrast  Result Date: 09/29/2019 CLINICAL DATA:  Follow-up diverticulitis and diverticular abscess. Status post percutaneous catheter drainage. EXAM: CT ABDOMEN AND PELVIS WITH CONTRAST TECHNIQUE: Multidetector CT imaging of the abdomen and pelvis was performed using the standard protocol following bolus administration of intravenous contrast. CONTRAST:  172mL OMNIPAQUE IOHEXOL 300 MG/ML  SOLN COMPARISON:  09/19/2019 FINDINGS: Lower Chest: No acute findings. Hepatobiliary: No hepatic masses identified. Gallbladder is unremarkable. No evidence of biliary ductal dilatation. Pancreas:  No mass or inflammatory changes. Spleen: Within normal limits in size and appearance. Adrenals/Urinary Tract: No masses identified. No evidence of ureteral calculi or hydronephrosis.  Stomach/Bowel: Mild proximal sigmoid diverticulitis shows no significant change. Percutaneous drainage catheter remains in place and there is no residual fluid collection identified. Vascular/Lymphatic: No pathologically enlarged lymph nodes. No abdominal aortic aneurysm. Reproductive: Small anterior uterine fibroid again seen measuring approximately 2 cm. No adnexal mass or free fluid identified. Other:  None. Musculoskeletal:  No suspicious bone lesions identified. IMPRESSION: 1. Mild proximal sigmoid diverticulitis, without significant change. Left lower quadrant percutaneous drainage catheter remains in place. No residual abscess or other fluid collections identified. 2. Stable small anterior uterine fibroid. Electronically Signed   By: Marlaine Hind M.D.   On: 09/29/2019 16:18    Time Spent in minutes 35   Natarsha Hurwitz M.D on 10/23/2019 at 11:19 AM  Between 7am to 7pm - Pager - [phone]  After 7pm go to [email or web address] - password [facility]  [facility] -  Office  [phone]

## 2019-10-23 NOTE — Progress Notes (Signed)
Pharmacy Antibiotic Note  Sara Chapman is a 44 y.o. female admitted on 10/18/2019 with bacteremia. Patient with recent history of complicated diverticulitis which was treated with antibiotics and abscess drainage. Pharmacy has been consulted for Vancomycin dosing. Patient is also on pip/tazo and doxycycline. ID has been consulted.  WBC down-trending, afebrile x24h. Doxycycline started yesterday for recent tick bite. Repeat BCx negative so far.   Plan: Switching to: Vancomycin 750 mg IV Q 24 hrs. Goal AUC 400-550. Expected AUC: 472.9 Baseline on 2 level kinetics:  Peak (03/25 @ 0413) 51 mcg/mL Random (03/25 @ 2156) 15 mcg/mL  Estimated AUC at vanc 1g q24h: AUC 630.7 Cmax 52 Cmin 10.6  Patient specific: Ke 0.0691 hr-1 T/12 10 hrs  Will recheck vanc peak and trough on 4th dose and continue to monitor clinical course and renal function. Will readjust if any acute changes in renal function occur.   Height: 4\' 11"  (149.9 cm) Weight: 158 lb 8.2 oz (71.9 kg) IBW/kg (Calculated) : 43.2  Temp (24hrs), Avg:98.2 F (36.8 C), Min:97.9 F (36.6 C), Max:98.4 F (36.9 C)  Recent Labs  Lab 10/17/19 2347 10/18/19 0241 10/18/19 0421 10/19/19 0537 10/20/19 0240 10/21/19 0432 10/22/19 0440 10/23/19 0413 10/23/19 2156  WBC   < >  --  12.1* 12.1* 9.5 4.8 4.1  --   --   CREATININE   < >  --   --  1.04* 1.20* 1.16* 1.18* 1.27*  --   LATICACIDVEN  --  2.5* 2.0*  --   --   --   --   --   --   VANCOTROUGH  --   --   --   --   --   --   --   --  15  VANCOPEAK  --   --   --   --   --   --   --  51*  --    < > = values in this interval not displayed.    Estimated Creatinine Clearance: 48.3 mL/min (A) (by C-G formula based on SCr of 1.27 mg/dL (H)).    Allergies  Allergen Reactions  . Clonazepam Other (See Comments)    SUICIDAL THOUGHTS SUICIDAL THOUGHTS SUICIDAL THOUGHTS   . Ibuprofen Other (See Comments)    stomach ulcers stomach ulcers stomach ulcers stomach ulcers   .  Nsaids Other (See Comments)    GI ulcers  . Pseudoephedrine Other (See Comments)    palpitations palpitations   . Tolmetin Other (See Comments)    GI ulcers GI ulcers   . Pseudoephedrine Hcl Other (See Comments) and Palpitations    Unable to tolerate Unable to tolerate   . Tape Rash    Paper tape breaks down the skin and causes sores    Antimicrobials this admission: Pip/tazo 3/20 >>  Vancomycin 3/21 >>  Doxycycline 3/22 >>  Dose adjustments this admission: n/a  Microbiology results: 3/22 BCx: No growth x 24h 3/20 BCx: 3/4 bottles Staphylococcus haemolyticus 3/20 MRSA PCR: negative  Thank you for allowing pharmacy to be a part of this patient's care.  4/20, PharmD, BCPS Clinical Pharmacist 10/23/2019 10:44 PM

## 2019-10-23 NOTE — Progress Notes (Signed)
[facility] Date of Admission:  10/18/2019     ID: Sara Chapman is a 44 y.o. female with  diverticulitis Principal Problem:   Sepsis (HCC) Active Problems:   Acute diverticulitis   Subjective: No fevers,feeling a lot better but still abd pain.   ROS  Eleven systems are reviewed and negative except per hpi  Medications:  Antibiotics Given (last 72 hours)    Date/Time Action Medication Dose Rate   10/20/19 1443 New Bag/Given   piperacillin-tazobactam (ZOSYN) IVPB 3.375 g 3.375 g 12.5 mL/hr   10/20/19 1848 New Bag/Given   doxycycline (VIBRAMYCIN) 100 mg in sodium chloride 0.9 % 250 mL IVPB 100 mg 125 mL/hr   10/20/19 2146 New Bag/Given   piperacillin-tazobactam (ZOSYN) IVPB 3.375 g 3.375 g 12.5 mL/hr   10/21/19 0146 New Bag/Given   vancomycin (VANCOCIN) IVPB 1000 mg/200 mL premix 1,000 mg 200 mL/hr   10/21/19 0538 New Bag/Given   doxycycline (VIBRAMYCIN) 100 mg in sodium chloride 0.9 % 250 mL IVPB 100 mg 125 mL/hr   10/21/19 0902 New Bag/Given   piperacillin-tazobactam (ZOSYN) IVPB 3.375 g 3.375 g 12.5 mL/hr   10/21/19 1819 New Bag/Given   doxycycline (VIBRAMYCIN) 100 mg in sodium chloride 0.9 % 250 mL IVPB 100 mg 125 mL/hr   10/21/19 2010 New Bag/Given   piperacillin-tazobactam (ZOSYN) IVPB 3.375 g 3.375 g 12.5 mL/hr   10/22/19 0235 New Bag/Given   vancomycin (VANCOCIN) IVPB 1000 mg/200 mL premix 1,000 mg 200 mL/hr   10/22/19 0530 New Bag/Given   doxycycline (VIBRAMYCIN) 100 mg in sodium chloride 0.9 % 250 mL IVPB 100 mg 125 mL/hr   10/22/19 0532 New Bag/Given   piperacillin-tazobactam (ZOSYN) IVPB 3.375 g 3.375 g 12.5 mL/hr   10/22/19 1357 New Bag/Given   piperacillin-tazobactam (ZOSYN) IVPB 3.375 g 3.375 g 12.5 mL/hr   10/22/19 1741 New Bag/Given   doxycycline (VIBRAMYCIN) 100 mg in sodium chloride 0.9 % 250 mL IVPB 100 mg 125 mL/hr   10/22/19 2232 New Bag/Given   piperacillin-tazobactam (ZOSYN) IVPB 3.375 g 3.375 g 12.5 mL/hr    10/23/19 0256 New Bag/Given   vancomycin (VANCOCIN) IVPB 1000 mg/200 mL premix 1,000 mg 200 mL/hr   10/23/19 0546 New Bag/Given   doxycycline (VIBRAMYCIN) 100 mg in sodium chloride 0.9 % 250 mL IVPB 100 mg 125 mL/hr   10/23/19 0756 New Bag/Given  [medication not available per night nurse]   piperacillin-tazobactam (ZOSYN) IVPB 3.375 g 3.375 g 12.5 mL/hr   10/23/19 1412 New Bag/Given   piperacillin-tazobactam (ZOSYN) IVPB 3.375 g 3.375 g 12.5 mL/hr     . feeding supplement  1 Container Oral TID BM  . topiramate  100 mg Oral BID    Objective: Vital signs in last 24 hours: Temp:  [97.9 F (36.6 C)-98.4 F (36.9 C)] 98.4 F (36.9 C) (03/25 0734) Pulse Rate:  [72-94] 77 (03/25 0734) Resp:  [18-20] 18 (03/25 0734) BP: (137-195)/(82-104) 144/91 (03/25 0734) SpO2:  [94 %-96 %] 96 % (03/25 0734) Constitutional:  oriented to person, place, and time. appears well-developed and well-nourished. Mod ill appearing, HENT: [place]/AT, PERRLA, no scleral icterus Mouth/Throat: Oropharynx is clear and moist. No oropharyngeal exudate.  Cardiovascular: Normal rate, regular rhythm and normal heart sounds.  Pulmonary/Chest: Effort normal and breath sounds normal. No respiratory distress.  has no wheezes.  Neck - supple, no nuchal rigidity Abdominal: Soft.+ TTP mid and L side Lymphadenopathy: no cervical adenopathy. No axillary adenopathy Neurological: alert and oriented to person, place, and time.  Skin: Skin is warm and dry. No rash noted. No erythema.  Psychiatric: a normal mood and affect.  behavior is normal.   Lab Results Recent Labs    10/21/19 0432 10/21/19 0432 10/22/19 0440 10/23/19 0413  WBC 4.8  --  4.1  --   HGB 8.8*  --  9.0*  --   HCT 26.9*  --  26.8*  --   NA 143   < > 145 141  K 3.3*   < > 3.1* 3.0*  CL 108   < > 110 109  CO2 28   < > 29 24  BUN <5*   < > <5* <5*  CREATININE 1.16*   < > 1.18* 1.27*   < > = values in this interval not displayed.    Microbiology: Results  for orders placed or performed during the hospital encounter of 10/18/19  Respiratory Panel by RT PCR (Flu A&B, Covid) -     Status: None   Collection Time: 10/18/19  2:41 AM  Result Value Ref Range Status   SARS Coronavirus 2 by RT PCR NEGATIVE NEGATIVE Final    Comment: (NOTE) SARS-CoV-2 target nucleic acids are NOT DETECTED. The SARS-CoV-2 RNA is generally detectable in upper respiratoy specimens during the acute phase of infection. The lowest concentration of SARS-CoV-2 viral copies this assay can detect is 131 copies/mL. A negative result does not preclude SARS-Cov-2 infection and should not be used as the sole basis for treatment or other patient management decisions. A negative result may occur with  improper specimen collection/handling, submission of specimen other than nasopharyngeal swab, presence of viral mutation(s) within the areas targeted by this assay, and inadequate number of viral copies (<131 copies/mL). A negative result must be combined with clinical observations, patient history, and epidemiological information. The expected result is Negative. Fact Sheet for Patients:  https://www.moore.com/ Fact Sheet for Healthcare Providers:  https://www.young.biz/ This test is not yet ap proved or cleared by the [place] FDA and  has been authorized for detection and/or diagnosis of SARS-CoV-2 by FDA under an Emergency Use Authorization (EUA). This EUA will remain  in effect (meaning this test can be used) for the duration of the COVID-19 declaration under Section 564(b)(1) of the Act, 21 U.S.C. section 360bbb-3(b)(1), unless the authorization is terminated or revoked sooner.    Influenza A by PCR NEGATIVE NEGATIVE Final   Influenza B by PCR NEGATIVE NEGATIVE Final    Comment: (NOTE) The Xpert Xpress SARS-CoV-2/FLU/RSV assay is intended as an aid in  the diagnosis of influenza from Nasopharyngeal swab specimens and  should not  be used as a sole basis for treatment. Nasal washings and  aspirates are unacceptable for Xpert Xpress SARS-CoV-2/FLU/RSV  testing. Fact Sheet for Patients: https://www.moore.com/ Fact Sheet for Healthcare Providers: https://www.young.biz/ This test is not yet approved or cleared by the [place] FDA and  has been authorized for detection and/or diagnosis of SARS-CoV-2 by  FDA under an Emergency Use Authorization (EUA). This EUA will remain  in effect (meaning this test can be used) for the duration of the  Covid-19 declaration under Section 564(b)(1) of the Act, 21  U.S.C. section 360bbb-3(b)(1), unless the authorization is  terminated or revoked. Performed at [facility], [address] [place], [place] [address]   Culture, blood (routine x 2)     Status: Abnormal   Collection Time: 10/18/19  2:41 AM   Specimen: BLOOD  Result Value Ref Range Status   Specimen Description  Final    BLOOD RIGHT FOREARM Performed at [facility], [address], [place], [place] [address]    Special Requests   Final    BOTTLES DRAWN AEROBIC AND ANAEROBIC Blood Culture adequate volume Performed at [facility], [address] [place], [place] [address]    Culture  Setup Time   Final    Organism ID to follow GRAM POSITIVE COCCI IN BOTH AEROBIC AND ANAEROBIC BOTTLES CRITICAL RESULT CALLED TO, READ BACK BY AND VERIFIED WITH:  ASAJAH DUNCAN ON 10/18/2019 AT 2051 TIK    Culture (A)  Final    STAPHYLOCOCCUS HAEMOLYTICUS SUSCEPTIBILITIES PERFORMED ON PREVIOUS CULTURE WITHIN THE LAST 5 DAYS. Performed at [facility], [address], [place], [place] [address]    Report Status 10/21/2019 FINAL  Final  Culture, blood (routine x 2)     Status: Abnormal   Collection Time: 10/18/19  2:41 AM   Specimen: BLOOD  Result Value Ref Range Status   Specimen Description   Final    BLOOD LEFT ANTECUBITAL Performed at [facility], [address] [place], [place] [address]    Special Requests   Final    BOTTLES DRAWN AEROBIC AND ANAEROBIC Blood Culture adequate volume Performed at [facility], [address] [place], [place] [address]    Culture  Setup Time   Final    GRAM POSITIVE COCCI ANAEROBIC BOTTLE ONLY CRITICAL RESULT CALLED TO, READ BACK BY AND VERIFIED WITH: [facility] DUNCAN AT 1742 10/18/19.PMF Performed at [facility], [address] [place], [place] [address]    Culture STAPHYLOCOCCUS HAEMOLYTICUS (A)  Final   Report Status 10/21/2019 FINAL  Final   Organism ID, Bacteria STAPHYLOCOCCUS HAEMOLYTICUS  Final      Susceptibility   Staphylococcus haemolyticus - MIC*    CIPROFLOXACIN <=0.5 SENSITIVE Sensitive     ERYTHROMYCIN >=8 RESISTANT Resistant     GENTAMICIN <=0.5 SENSITIVE Sensitive     OXACILLIN >=4 RESISTANT Resistant     TETRACYCLINE >=16 RESISTANT Resistant     VANCOMYCIN 1 SENSITIVE Sensitive     TRIMETH/SULFA <=10 SENSITIVE Sensitive     CLINDAMYCIN 1 INTERMEDIATE Intermediate     RIFAMPIN <=0.5 SENSITIVE Sensitive     Inducible Clindamycin NEGATIVE Sensitive     * STAPHYLOCOCCUS HAEMOLYTICUS  Blood Culture ID Panel (Reflexed)     Status: Abnormal   Collection Time: 10/18/19  2:41 AM  Result Value Ref Range Status   Enterococcus species NOT DETECTED NOT DETECTED Final   Listeria monocytogenes NOT DETECTED NOT DETECTED Final   Staphylococcus species DETECTED (A) NOT DETECTED Final    Comment: Methicillin (oxacillin) resistant coagulase negative staphylococcus. Possible blood culture contaminant (unless isolated from more than one blood culture draw or clinical case suggests pathogenicity). No antibiotic treatment is indicated for blood  culture contaminants. CRITICAL RESULT CALLED TO, READ BACK BY AND VERIFIED WITH: ASAJAH DUNCAN ON 10/18/2019 AT 2051 TIK    Staphylococcus aureus (BCID) NOT DETECTED NOT DETECTED Final   Methicillin resistance DETECTED  (A) NOT DETECTED Final    Comment: CRITICAL RESULT CALLED TO, READ BACK BY AND VERIFIED WITH: ASAJAH DUNCAN ON 10/18/2019 AT 2051 TIK    Streptococcus species NOT DETECTED NOT DETECTED Final   Streptococcus agalactiae NOT DETECTED NOT DETECTED Final   Streptococcus pneumoniae NOT DETECTED NOT DETECTED Final   Streptococcus pyogenes NOT DETECTED NOT DETECTED Final   Acinetobacter baumannii NOT DETECTED NOT DETECTED Final   Enterobacteriaceae species NOT DETECTED NOT  DETECTED Final   Enterobacter cloacae complex NOT DETECTED NOT DETECTED Final   Escherichia coli NOT DETECTED NOT DETECTED Final   Klebsiella oxytoca NOT DETECTED NOT DETECTED Final   Klebsiella pneumoniae NOT DETECTED NOT DETECTED Final   Proteus species NOT DETECTED NOT DETECTED Final   Serratia marcescens NOT DETECTED NOT DETECTED Final   Haemophilus influenzae NOT DETECTED NOT DETECTED Final   Neisseria meningitidis NOT DETECTED NOT DETECTED Final   Pseudomonas aeruginosa NOT DETECTED NOT DETECTED Final   Candida albicans NOT DETECTED NOT DETECTED Final   Candida glabrata NOT DETECTED NOT DETECTED Final   Candida krusei NOT DETECTED NOT DETECTED Final   Candida parapsilosis NOT DETECTED NOT DETECTED Final   Candida tropicalis NOT DETECTED NOT DETECTED Final    Comment: Performed at [facility], [address] [place], [place] [address]  MRSA PCR Screening     Status: None   Collection Time: 10/18/19  6:44 PM   Specimen: Nasopharyngeal  Result Value Ref Range Status   MRSA by PCR NEGATIVE NEGATIVE Final    Comment:        The GeneXpert MRSA Assay (FDA approved for NASAL specimens only), is one component of a comprehensive MRSA colonization surveillance program. It is not intended to diagnose MRSA infection nor to guide or monitor treatment for MRSA infections. Performed at [facility], [address] [place], [place] [address]   Culture, blood (Routine X 2) w Reflex to ID Panel      Status: None (Preliminary result)   Collection Time: 10/20/19  2:58 PM   Specimen: BLOOD  Result Value Ref Range Status   Specimen Description BLOOD BLOOD LEFT HAND  Final   Special Requests   Final    BOTTLES DRAWN AEROBIC AND ANAEROBIC Blood Culture adequate volume   Culture   Final    NO GROWTH 3 DAYS Performed at [facility], [address] [place], [place] [address]    Report Status PENDING  Incomplete  Culture, blood (Routine X 2) w Reflex to ID Panel     Status: None (Preliminary result)   Collection Time: 10/20/19  3:05 PM   Specimen: BLOOD  Result Value Ref Range Status   Specimen Description BLOOD BLOOD RIGHT HAND  Final   Special Requests   Final    BOTTLES DRAWN AEROBIC AND ANAEROBIC Blood Culture adequate volume   Culture   Final    NO GROWTH 3 DAYS Performed at [facility], [address] [place], [place] [address]    Report Status PENDING  Incomplete    Studies/Results: CT ABDOMEN PELVIS W CONTRAST  Result Date: 10/22/2019 CLINICAL DATA:  Diverticulitis follow up EXAM: CT ABDOMEN AND PELVIS WITH CONTRAST TECHNIQUE: Multidetector CT imaging of the abdomen and pelvis was performed using the standard protocol following bolus administration of intravenous contrast. CONTRAST:  19mL OMNIPAQUE IOHEXOL 300 MG/ML  SOLN COMPARISON:  10/18/2019 FINDINGS: Lower chest: New small to moderate bilateral pleural effusions with associated passive atelectasis. Hepatobiliary: Distended gallbladder with suspected Phrygian cap. Gallbladder wall thickening versus pericholecystic fluid. No focal hepatic parenchymal lesion is identified. Pancreas: Unremarkable Spleen: Unremarkable Adrenals/Urinary Tract: Unremarkable Stomach/Bowel: There is a collection of abscesses resembling a string of beads extending from a sigmoid colon paracolic abscess with gas and fluid components on images 49 through 54 series 2, up along adjacent loops of small bowel. Whereas previously there was  simply loculations of gas along the small bowel, there is now a more confluent elongated abscess  measuring 6.8 by 1.7 by 1.7 cm (volume = 10 cm^3) on image 77/5. The portion of the abscess adjacent to the sigmoid colon is multilobulated but measures about 3.5 by 1.5 by 2.3 cm (volume = 6.3 cm^3). There are several additional tiny loculations of gas along the regional mesentery. Overall the appearance is worsened from 10/18/2019. Vascular/Lymphatic: Small reactive retroperitoneal and pelvic lymph nodes. Reproductive: Suspected small anterior fundal fibroid, a bowel 1.4 cm in diameter. Other: Increased stranding/edema in the mesentery. Trace free fluid along the right hepatic lobe margin similar to prior. Musculoskeletal: Unremarkable IMPRESSION: 1. Paracolic abscess along the sigmoid colon is itself similar in size to the prior exam, but there is an enlarging elongated abscess tracking along adjacent loops of small bowel currently 10 cubic cm in volume, with increased surrounding mesenteric edema. This abscess along the small bowel is notably increased from prior. 2. New small to moderate bilateral pleural effusions with associated passive atelectasis. 3. Distended gallbladder with wall thickening versus pericholecystic fluid. 4. Suspected small anterior fundal fibroid. 5. Trace free fluid along the right hepatic lobe margin, similar to prior. Electronically Signed   By: Gaylyn Rong M.D.   On: 10/22/2019 11:14    Assessment/Plan: Shyia Fillingim is a 44 y.o. female femalewith medical history significant forrecurrent diverticulitis, recently hospitalized under the surgical service for treatment of diverticulitis with perforation and abscess, treated conservatively with percutaneous drains, removed on 09/29/2019 and finished oral cipro flagy about a week ago now with recurrent diverticulitis and bcx + 2/2 sets Staph haemolyticus 3/24- no fevers ,wbc down. Still nausea. Repeat imaging noted. 3/25 - no  fevers, wbc 4. FU bcx neg. Recommendations Continue zosyn for intrabdominal infection. Monitor wbc and fever curve. For possible IR drain tomorrow. Given previous failure of otpt oral abx can consider PICC placement and IV abx as otpt.   Staph haemolyticus  bacteremia- I suspect contaminant as no PICC line in place, no skin and soft tissue infections, no UTI sxs and UA neg. Patient does report that the bcx were done in ED From 2 separate sites.   Repeat bcx neg. Will continue a 7 day total vanco then stop and follow. Will check echo.  Low threshhold to repeat bcx if febrile.  Thank you very much for the consult. Dr Rivka Safer will be available starting tomorrow.  Mick Sell   10/23/2019, 2:29 PM

## 2019-10-23 NOTE — Progress Notes (Addendum)
[facility] SURGICAL PROGRESS NOTE (cpt [id number])  Hospital Day(s): 5.  Interval History: Patient seen and examined, overnight had issues with anxiety. Patient reports she is feeling mildly better this morning but states "she is over being sick." Still with nausea, emesis subsided. No fever or chills. Renal function remains baseline, IVF decreased overnight, good UO. Hypokalemia 3.0 and hypomagnesemia 1.5. Otherwise no new complaints.   Review of Systems:  Constitutional: denies fever, chills  HEENT: denies cough or congestion  Respiratory: denies any shortness of breath  Cardiovascular: denies chest pain or palpitations  Gastrointestinal: + abdominal pain (improved), + Nausea, denied Vomiting, denied diarrhea/and bowel function as per interval history Genitourinary: denies burning with urination or urinary frequency  Vital signs in last 24 hours: [min-max] current  Temp:  [97.9 F (36.6 C)-98.3 F (36.8 C)] 97.9 F (36.6 C) (03/25 0031) Pulse Rate:  [72-94] 72 (03/25 0031) Resp:  [18-20] 18 (03/25 0031) BP: (137-195)/(82-104) 137/85 (03/25 0031) SpO2:  [93 %-94 %] 94 % (03/25 0031)     Height: 4\' 11"  (149.9 cm) Weight: 71.9 kg BMI (Calculated): 32   Intake/Output last 2 shifts:  03/24 0701 - 03/25 0700 In: 4101.5 [I.V.:2685.6; IV Piggyback:1415.9] Out: -    Physical Exam:  Constitutional: alert, cooperative and no distress  HENT: normocephalic without obvious abnormality  Eyes: PERRL, EOM's grossly intact and symmetric  Respiratory: breathing non-labored at rest  Cardiovascular: regular rate and sinus rhythm  Gastrointestinal: Soft, fairly generalized tenderness although I do think this is improved today, and non-distended, no rebound/guarding, certainly not peritonitic Musculoskeletal: no edema or wounds, motor and sensation grossly intact, NT    Labs:  CBC Latest Ref Rng & Units 10/22/2019 10/21/2019 10/20/2019  WBC 4.0 - 10.5 K/uL 4.1 4.8 9.5  Hemoglobin 12.0  - 15.0 g/dL 9.0(L) 8.8(L) 9.8(L)  Hematocrit 36.0 - 46.0 % 26.8(L) 26.9(L) 30.6(L)  Platelets 150 - 400 K/uL 219 198 217   CMP Latest Ref Rng & Units 10/23/2019 10/22/2019 10/21/2019  Glucose 70 - 99 mg/dL 111(H) 116(H) 108(H)  BUN 6 - 20 mg/dL <5(L) <5(L) <5(L)  Creatinine 0.44 - 1.00 mg/dL 1.27(H) 1.18(H) 1.16(H)  Sodium 135 - 145 mmol/L 141 145 143  Potassium 3.5 - 5.1 mmol/L 3.0(L) 3.1(L) 3.3(L)  Chloride 98 - 111 mmol/L 109 110 108  CO2 22 - 32 mmol/L 24 29 28   Calcium 8.9 - 10.3 mg/dL 8.5(L) 8.4(L) 8.0(L)  Total Protein 6.5 - 8.1 g/dL - 5.8(L) -  Total Bilirubin 0.3 - 1.2 mg/dL - 0.6 -  Alkaline Phos 38 - 126 U/L - 94 -  AST 15 - 41 U/L - 11(L) -  ALT 0 - 44 U/L - 10 -     Imaging studies: No new pertinent imaging studies   Assessment/Plan: (ICD-10's: K49.92) 44 y.o. female with persistent, but somewhat improvement, abdominal pain attributable torecurrent-vs- unresolveddiverticulitis with extraluminal air however this was small and likely from previous percutaneous drain, although perforation is also plausible.   - Okay for clear liquid diet today; NPO after midnight --> although she is on CLD her PO intake has been minimal over the last 5-6 days. We discussed the possibility of needed TPN if her PO status does not improve.   - I do think it is probably in her best interest to attempt CT guided aspiration at a minimum with IR tomorrow; we did discuss this IR (Dr Pascal Lux) them on 03/24  - Continue IV Abx (Zosyn); ID also involved; vancomycin stopped - Monitor labs;  improving - Pain control prn; antiemetics prn - serial abdominal examination - I do not think she warrants any emergent surgical intervention; we have discussed the possibility of minimally invasive surgical intervention this admission, she has a spot in the OR tomorrow (03/26) however we will wait to proceed with this pending clinical condition. She and her husband would  like to continue conservative management.  - Mobilization encouraged - Further management per primary team  - Hold any DVT prophylaxis in anticipation of possible procedure tomorrow  All of the above findings and recommendations were discussed with the patient, and the medical team, and all of patient's questions were answered to her expressed satisfaction.  Please note I spent approximately 25 minutes with this patient this morning with greater than 50% of the time spent coordinating care and discussion of current condition.   -- Lynden Oxford, PA-C [facility] 10/23/2019, 7:19 AM [phone] M-F: 7am - 4pm

## 2019-10-23 NOTE — Progress Notes (Signed)
Nutrition Follow-up  RD working remotely.  DOCUMENTATION CODES:   Not applicable  INTERVENTION:  Provide Boost Breeze po TID, each supplement provides 250 kcal and 9 grams of protein.  If diet is able to advance recommend providing Ensure Enlive po TID, each supplement provides 350 kcal and 20 grams of protein.  Agree that if patient's diet is unable to be advanced in the next several days and PO intake does not improve she would benefit from TPN in setting of poor PO intake for the past 5-6 days. Will continue to monitor for plan.  Patient is at risk for refeeding syndrome and is receiving dextrose in IV fluids. Potassium and Magnesium are being monitored. Recommend also monitoring phosphorus daily and replacing as needed.  NUTRITION DIAGNOSIS:   Inadequate oral intake related to decreased appetite, acute illness(diverticulitis) as evidenced by per patient/family report.  Ongoing.  GOAL:   Patient will meet greater than or equal to 90% of their needs  Not met.  MONITOR:   Diet advancement, PO intake, Supplement acceptance, Labs, Weight trends, I & O's  REASON FOR ASSESSMENT:   Malnutrition Screening Tool    ASSESSMENT:   44 year old female with PMHx of psoriatic arthritis, stomach ulcer, bowel perforation, complicated acute perforated diverticulitis s/p placement of 2 abscess drains that have now been removed (last removed on 3/2) who is now admitted with recurrent vs unresolved diverticulitis with extraluminal air.  Attempted to call patient over the phone several times but the line was busy at each attempt. Patient was advanced to clear liquids on the evening of 3/22 but has not been advanced any further. Only meal documentation available in chart is 0% of breakfast on 3/23. Per MD note patient was tolerating some clear liquids this morning. According to surgery note patient has a spot in the OR tomorrow for possible surgical intervention if needed. They also discussed  that patient may need TPN if PO intake remains poor.  Medications reviewed and include: Topamax, D5-1/2NS at 50 mL/hr, doxycycline, magnesium sulfate 4 grams IV once today, Zosyn.  Labs reviewed: CBG 91, Potassium 3, BUN <5, Creatinine 1.27, Magnesium 1.5.  Diet Order:   Diet Order            Diet NPO time specified  Diet effective midnight        Diet clear liquid Room service appropriate? Yes; Fluid consistency: Thin  Diet effective now             EDUCATION NEEDS:   Not appropriate for education at this time  Skin:  Skin Assessment: Reviewed RN Assessment  Last BM:  10/22/2019 per chart  Height:   Ht Readings from Last 1 Encounters:  10/19/19 _0  (1.499 m)   Weight:   Wt Readings from Last 1 Encounters:  10/19/19 71.9 kg   Ideal Body Weight:  43.2 kg  BMI:  Body mass index is 32.02 kg/m.  Estimated Nutritional Needs:   Kcal:  1600-1800  Protein:  85-95 grams  Fluid:  1.8 L/day  Jacklynn Barnacle, MS, RD, LDN Pager number available on Amion

## 2019-10-24 ENCOUNTER — Encounter
Admission: EM | Disposition: A | Discharge status: Home Health | Payer: Self-pay | Source: Home / Self Care | Attending: Internal Medicine

## 2019-10-24 ENCOUNTER — Inpatient Hospital Stay (HOSPITAL_COMMUNITY)
Admit: 2019-10-24 | Discharge: 2019-10-24 | Disposition: A | Discharge status: Home / Self Care | Payer: BC Managed Care – PPO | Attending: Infectious Diseases | Admitting: Infectious Diseases

## 2019-10-24 ENCOUNTER — Inpatient Hospital Stay: Payer: BC Managed Care – PPO

## 2019-10-24 ENCOUNTER — Inpatient Hospital Stay: Admit: 2019-10-24 | Payer: BC Managed Care – PPO

## 2019-10-24 DIAGNOSIS — R7881 Bacteremia: Secondary | ICD-10-CM

## 2019-10-24 DIAGNOSIS — Z978 Presence of other specified devices: Secondary | ICD-10-CM

## 2019-10-24 DIAGNOSIS — B957 Other staphylococcus as the cause of diseases classified elsewhere: Secondary | ICD-10-CM

## 2019-10-24 DIAGNOSIS — A411 Sepsis due to other specified staphylococcus: Principal | ICD-10-CM

## 2019-10-24 DIAGNOSIS — L4050 Arthropathic psoriasis, unspecified: Secondary | ICD-10-CM

## 2019-10-24 DIAGNOSIS — K5720 Diverticulitis of large intestine with perforation and abscess without bleeding: Secondary | ICD-10-CM

## 2019-10-24 LAB — BASIC METABOLIC PANEL
Anion gap: 8 (ref 5–15)
BUN: 5 mg/dL — ABNORMAL LOW (ref 6–20)
CO2: 20 mmol/L — ABNORMAL LOW (ref 22–32)
Calcium: 8.2 mg/dL — ABNORMAL LOW (ref 8.9–10.3)
Chloride: 113 mmol/L — ABNORMAL HIGH (ref 98–111)
Creatinine, Ser: 1.32 mg/dL — ABNORMAL HIGH (ref 0.44–1.00)
GFR calc Af Amer: 56 mL/min — ABNORMAL LOW (ref >60)
GFR calc non Af Amer: 49 mL/min — ABNORMAL LOW (ref >60)
Glucose, Bld: 95 mg/dL (ref 70–99)
Potassium: 3.7 mmol/L (ref 3.5–5.1)
Sodium: 141 mmol/L (ref 135–145)

## 2019-10-24 LAB — ECHOCARDIOGRAM COMPLETE
Height: 59 in
Weight: 2536.17 oz

## 2019-10-24 LAB — CBC
HCT: 32.7 % — ABNORMAL LOW (ref 36.0–46.0)
Hemoglobin: 10.8 g/dL — ABNORMAL LOW (ref 12.0–15.0)
MCH: 30.7 pg (ref 26.0–34.0)
MCHC: 33 g/dL (ref 30.0–36.0)
MCV: 92.9 fL (ref 80.0–100.0)
Platelets: 335 10*3/uL (ref 150–400)
RBC: 3.52 MIL/uL — ABNORMAL LOW (ref 3.87–5.11)
RDW: 13.2 % (ref 11.5–15.5)
WBC: 5.9 10*3/uL (ref 4.0–10.5)
nRBC: 0 % (ref 0.0–0.2)

## 2019-10-24 SURGERY — COLECTOMY, PARTIAL, ROBOT-ASSISTED, LAPAROSCOPIC
Anesthesia: General

## 2019-10-24 MED ORDER — SODIUM CHLORIDE 0.9 % IV SOLN
3.0000 g | Freq: Four times a day (QID) | INTRAVENOUS | Status: DC
  Administered 2019-10-24 – 2019-10-26 (×7): 3 g via INTRAVENOUS
  Filled 2019-10-24 (×2): qty 3
  Filled 2019-10-24: qty 8
  Filled 2019-10-24 (×3): qty 3
  Filled 2019-10-24 (×2): qty 8
  Filled 2019-10-24 (×2): qty 3

## 2019-10-24 MED ORDER — FENTANYL CITRATE (PF) 100 MCG/2ML IJ SOLN
INTRAMUSCULAR | Status: AC
  Filled 2019-10-24: qty 2

## 2019-10-24 MED ORDER — MIDAZOLAM HCL 5 MG/5ML IJ SOLN
INTRAMUSCULAR | Status: AC
  Filled 2019-10-24: qty 5

## 2019-10-24 MED ORDER — ALUM & MAG HYDROXIDE-SIMETH 200-200-20 MG/5ML PO SUSP
30.0000 mL | Freq: Four times a day (QID) | ORAL | Status: DC | PRN
  Administered 2019-10-24: 30 mL via ORAL
  Filled 2019-10-24: qty 30

## 2019-10-24 MED ORDER — MIDAZOLAM HCL 5 MG/5ML IJ SOLN
INTRAMUSCULAR | Status: AC | PRN
  Administered 2019-10-24: 1 mg via INTRAVENOUS

## 2019-10-24 MED ORDER — FENTANYL CITRATE (PF) 100 MCG/2ML IJ SOLN
INTRAMUSCULAR | Status: AC | PRN
  Administered 2019-10-24 (×2): 50 ug via INTRAVENOUS

## 2019-10-24 NOTE — Progress Notes (Addendum)
[facility] PROGRESS NOTE (cpt [phone])  Hospital Day(s): 6.   Interval History:  Patient seen and examined no acute events or new complaints overnight.  Patient reports she is feeling better Abdominal pain mildly improved, no nausea, emesis, or fevers.  Her sCr continues to increase today to 1.32, despite her I/O being +12L I suspect this is secondary to no measure outs which skew the data. Her PO intake is poor and we may be falling behind with fluids. She does have some component of CKD but this is above there baseline.  No leukocytosis; afebrile Plan today for CT guided aspiration vs drain placement if feasible  Review of Systems:  Constitutional: denies fever, chills  HEENT: denies cough or congestion  Respiratory: denies any shortness of breath  Cardiovascular: denies chest pain or palpitations  Gastrointestinal: + abdominal pain (clinically improving), denied N/V, or diarrhea/and bowel function as per interval history Genitourinary: denies burning with urination or urinary frequency   Vital signs in last 24 hours: [min-max] current  Temp:  [98.2 F (36.8 C)-98.4 F (36.9 C)] 98.3 F (36.8 C) (03/26 0000) Pulse Rate:  [74-87] 87 (03/26 0000) Resp:  [18-20] 20 (03/26 0000) BP: (132-171)/(79-106) 132/79 (03/26 0000) SpO2:  [96 %-97 %] 96 % (03/26 0000)     Height: 4\' 11"  (149.9 cm) Weight: 71.9 kg BMI (Calculated): 32   Intake/Output last 2 shifts:  03/25 0701 - 03/26 0700 In: 2179.7 [P.O.:1140; I.V.:702.4; IV Piggyback:337.3] Out: -    Physical Exam:  Constitutional: alert, cooperative and no distress  HENT: normocephalic without obvious abnormality  Eyes: PERRL, EOM's grossly intact and symmetric  Respiratory: breathing non-labored at rest  Cardiovascular: regular rate and sinus rhythm  Gastrointestinal: Soft, fairly generalized tenderness although I do think this is again improved today, and non-distended, no rebound/guarding, certainly not  peritonitic Musculoskeletal: no edema or wounds, motor and sensation grossly intact, NT    Labs:  CBC Latest Ref Rng & Units 10/24/2019 10/22/2019 10/21/2019  WBC 4.0 - 10.5 K/uL 5.9 4.1 4.8  Hemoglobin 12.0 - 15.0 g/dL 10.8(L) 9.0(L) 8.8(L)  Hematocrit 36.0 - 46.0 % 32.7(L) 26.8(L) 26.9(L)  Platelets 150 - 400 K/uL 335 219 198   CMP Latest Ref Rng & Units 10/24/2019 10/23/2019 10/22/2019  Glucose 70 - 99 mg/dL 95 111(H) 116(H)  BUN 6 - 20 mg/dL 5(L) <5(L) <5(L)  Creatinine 0.44 - 1.00 mg/dL 1.32(H) 1.27(H) 1.18(H)  Sodium 135 - 145 mmol/L 141 141 145  Potassium 3.5 - 5.1 mmol/L 3.7 3.0(L) 3.1(L)  Chloride 98 - 111 mmol/L 113(H) 109 110  CO2 22 - 32 mmol/L 20(L) 24 29  Calcium 8.9 - 10.3 mg/dL 8.2(L) 8.5(L) 8.4(L)  Total Protein 6.5 - 8.1 g/dL - - 5.8(L)  Total Bilirubin 0.3 - 1.2 mg/dL - - 0.6  Alkaline Phos 38 - 126 U/L - - 94  AST 15 - 41 U/L - - 11(L)  ALT 0 - 44 U/L - - 10     Imaging studies: No new pertinent imaging studies   Assessment/Plan: (ICD-10's: K19.92) 44 y.o. female with persistent, but somewhat improvement, abdominal pain attributable torecurrent-vs- unresolveddiverticulitis with resolution in extraluminal air although now with intra-abdominal abscess.   - NPO for IR procedure today, okay for CLD after procedure --> Her PO intake has been minimal over the last 5-6 days. We discussed the possibility of needed TPN if her PO status does not improve.   - Will plan for attempted CT guided aspiration at a minimum with  IR today; fluid cultures ordered; again discussed with IR (Dr Fredia Sorrow) yesterday  - Continue IV Abx (Zosyn); ID also involved   - Increase IVF resuscitation; I think the I/Os are skewed secondary to no measured outputs, her sCr continues to increase above her baseline and we may be falling behind   - Pain control prn; antiemetics prn - serial abdominal examination - I do not think she warrants any emergent surgical intervention;  we have discussed the possibility of minimally invasive surgical intervention this admission, but at this point her and her family would like to continue conservative management   - Mobilization encouraged - Further management per primary team             - Hold any DVT prophylaxis for IR procedure  All of the above findings and recommendations were discussed with the patient, and the medical team, and all of patient's questions were answered to her expressed satisfaction.  -- Lynden Oxford, PA-C [facility] 10/24/2019, 7:20 AM [phone] M-F: 7am - 4pm

## 2019-10-24 NOTE — Progress Notes (Signed)
Patient ID: Sara Chapman, female   DOB: 04/29/1976, 44 y.o.   MRN: [id number] [facility] PROGRESS NOTE  Sara Chapman DOB: 12/10/1975 DOA: 10/18/2019 PCP: Cathie Hoopswens, Leanne Whaley, PA  HPI/Subjective: Patient this morning complained of abdominal pain.  Had some nausea but no vomiting.  Was on liquid diet beforehand and tolerating.  Having diarrhea but not much in quantity.  Objective: Vitals:   10/24/19 1100 10/24/19 1115  BP: (!) 153/98 (!) 165/93  Pulse: 78 82  Resp: 20 16  Temp:    SpO2: 95% 94%    Intake/Output Summary (Last 24 hours) at 10/24/2019 1202 Last data filed at 10/23/2019 1712 Gross per 24 hour  Intake 1579.67 ml  Output --  Net 1579.67 ml   Filed Weights   10/17/19 2339 10/19/19 1700  Weight: 68.9 kg 71.9 kg    ROS: Review of Systems  Constitutional: Negative for chills and fever.  Eyes: Negative for blurred vision.  Respiratory: Negative for cough and shortness of breath.   Cardiovascular: Negative for chest pain.  Gastrointestinal: Positive for abdominal pain, diarrhea and nausea. Negative for constipation and vomiting.  Genitourinary: Negative for dysuria.  Musculoskeletal: Negative for joint pain.  Neurological: Negative for dizziness and headaches.   Exam: Physical Exam  Constitutional: She is oriented to person, place, and time.  HENT:  Nose: No mucosal edema.  Mouth/Throat: No oropharyngeal exudate or posterior oropharyngeal edema.  Eyes: Conjunctivae and lids are normal.  Cardiovascular: S1 normal and S2 normal. Exam reveals no gallop.  No murmur heard. Respiratory: No respiratory distress. She has decreased breath sounds in the right lower field and the left lower field. She has no wheezes. She has no rhonchi. She has no rales.  GI: Soft. Bowel sounds are normal. There is abdominal tenderness.  Musculoskeletal:     Right ankle: No swelling.     Left ankle: No swelling.  Lymphadenopathy:    She has no cervical  adenopathy.  Neurological: She is alert and oriented to person, place, and time. No cranial nerve deficit.  Skin: Skin is warm. No rash noted. Nails show no clubbing.  Psychiatric: She has a normal mood and affect.      Data Reviewed: Basic Metabolic Panel: Recent Labs  Lab 10/20/19 0240 10/21/19 0432 10/22/19 0440 10/23/19 0413 10/24/19 0323  NA 139 143 145 141 141  K 3.3* 3.3* 3.1* 3.0* 3.7  CL 104 108 110 109 113*  CO2 28 28 29 24  20*  GLUCOSE 114* 108* 116* 111* 95  BUN 7 <5* <5* <5* 5*  CREATININE 1.20* 1.16* 1.18* 1.27* 1.32*  CALCIUM 7.9* 8.0* 8.4* 8.5* 8.2*  MG  --   --   --  1.5*  --   PHOS  --   --   --  3.4  --    Liver Function Tests: Recent Labs  Lab 10/17/19 2347 10/22/19 0440  AST 21 11*  ALT 18 10  ALKPHOS 72 94  BILITOT 0.8 0.6  PROT 7.6 5.8*  ALBUMIN 3.8 2.4*   Recent Labs  Lab 10/17/19 2347  LIPASE 39   CBC: Recent Labs  Lab 10/18/19 0421 10/18/19 0421 10/19/19 0537 10/20/19 0240 10/21/19 0432 10/22/19 0440 10/24/19 0323  WBC 12.1*   < > 12.1* 9.5 4.8 4.1 5.9  NEUTROABS 10.6*  --   --   --   --   --   --   HGB 11.5*   < > 11.4* 9.8* 8.8* 9.0* 10.8*  HCT  34.4*   < > 35.1* 30.6* 26.9* 26.8* 32.7*  MCV 93.5   < > 94.6 95.0 95.1 91.8 92.9  PLT 237   < > 224 217 198 219 335   < > = values in this interval not displayed.    CBG: Recent Labs  Lab 10/21/19 1735 10/21/19 2016 10/22/19 0024 10/22/19 0405 10/22/19 0742  GLUCAP 84 73 97 121* 91    Recent Results (from the past 240 hour(s))  Respiratory Panel by RT PCR (Flu A&B, Covid) -     Status: None   Collection Time: 10/18/19  2:41 AM  Result Value Ref Range Status   SARS Coronavirus 2 by RT PCR NEGATIVE NEGATIVE Final    Comment: (NOTE) SARS-CoV-2 target nucleic acids are NOT DETECTED. The SARS-CoV-2 RNA is generally detectable in upper respiratoy specimens during the acute phase of infection. The lowest concentration of SARS-CoV-2 viral copies this assay can detect  is 131 copies/mL. A negative result does not preclude SARS-Cov-2 infection and should not be used as the sole basis for treatment or other patient management decisions. A negative result may occur with  improper specimen collection/handling, submission of specimen other than nasopharyngeal swab, presence of viral mutation(s) within the areas targeted by this assay, and inadequate number of viral copies (<131 copies/mL). A negative result must be combined with clinical observations, patient history, and epidemiological information. The expected result is Negative. Fact Sheet for Patients:  PinkCheek.be Fact Sheet for Healthcare Providers:  GravelBags.it This test is not yet ap proved or cleared by the [place] FDA and  has been authorized for detection and/or diagnosis of SARS-CoV-2 by FDA under an Emergency Use Authorization (EUA). This EUA will remain  in effect (meaning this test can be used) for the duration of the COVID-19 declaration under Section 564(b)(1) of the Act, 21 U.S.C. section 360bbb-3(b)(1), unless the authorization is terminated or revoked sooner.    Influenza A by PCR NEGATIVE NEGATIVE Final   Influenza B by PCR NEGATIVE NEGATIVE Final    Comment: (NOTE) The Xpert Xpress SARS-CoV-2/FLU/RSV assay is intended as an aid in  the diagnosis of influenza from Nasopharyngeal swab specimens and  should not be used as a sole basis for treatment. Nasal washings and  aspirates are unacceptable for Xpert Xpress SARS-CoV-2/FLU/RSV  testing. Fact Sheet for Patients: PinkCheek.be Fact Sheet for Healthcare Providers: GravelBags.it This test is not yet approved or cleared by the [place] FDA and  has been authorized for detection and/or diagnosis of SARS-CoV-2 by  FDA under an Emergency Use Authorization (EUA). This EUA will remain  in effect (meaning this  test can be used) for the duration of the  Covid-19 declaration under Section 564(b)(1) of the Act, 21  U.S.C. section 360bbb-3(b)(1), unless the authorization is  terminated or revoked. Performed at [facility], [address] [place], [place] [address]   Culture, blood (routine x 2)     Status: Abnormal   Collection Time: 10/18/19  2:41 AM   Specimen: BLOOD  Result Value Ref Range Status   Specimen Description   Final    BLOOD RIGHT FOREARM Performed at [facility], [address], [place], [place] [address]    Special Requests   Final    BOTTLES DRAWN AEROBIC AND ANAEROBIC Blood Culture adequate volume Performed at [facility], [address] [place], [place] [address]    Culture  Setup Time   Final    Organism ID to follow [email or web address]  COCCI IN BOTH AEROBIC AND ANAEROBIC BOTTLES CRITICAL RESULT CALLED TO, READ BACK BY AND VERIFIED WITH:  ASAJAH DUNCAN ON 10/18/2019 AT 2051 TIK    Culture (A)  Final    STAPHYLOCOCCUS HAEMOLYTICUS SUSCEPTIBILITIES PERFORMED ON PREVIOUS CULTURE WITHIN THE LAST 5 DAYS. Performed at [facility], [address] [place], [place] [address]    Report Status 10/21/2019 FINAL  Final  Culture, blood (routine x 2)     Status: Abnormal   Collection Time: 10/18/19  2:41 AM   Specimen: BLOOD  Result Value Ref Range Status   Specimen Description   Final    BLOOD LEFT ANTECUBITAL Performed at [facility], [address] [place], [place] [address]    Special Requests   Final    BOTTLES DRAWN AEROBIC AND ANAEROBIC Blood Culture adequate volume Performed at [facility], [address] [place], [place] [address]    Culture  Setup Time   Final    GRAM POSITIVE COCCI ANAEROBIC BOTTLE ONLY CRITICAL RESULT CALLED TO, READ BACK BY AND VERIFIED WITH: [facility] DUNCAN AT 1742 10/18/19.PMF Performed at [facility], [address] [place], [place] [address]    Culture STAPHYLOCOCCUS  HAEMOLYTICUS (A)  Final   Report Status 10/21/2019 FINAL  Final   Organism ID, Bacteria STAPHYLOCOCCUS HAEMOLYTICUS  Final      Susceptibility   Staphylococcus haemolyticus - MIC*    CIPROFLOXACIN <=0.5 SENSITIVE Sensitive     ERYTHROMYCIN >=8 RESISTANT Resistant     GENTAMICIN <=0.5 SENSITIVE Sensitive     OXACILLIN >=4 RESISTANT Resistant     TETRACYCLINE >=16 RESISTANT Resistant     VANCOMYCIN 1 SENSITIVE Sensitive     TRIMETH/SULFA <=10 SENSITIVE Sensitive     CLINDAMYCIN 1 INTERMEDIATE Intermediate     RIFAMPIN <=0.5 SENSITIVE Sensitive     Inducible Clindamycin NEGATIVE Sensitive     * STAPHYLOCOCCUS HAEMOLYTICUS  Blood Culture ID Panel (Reflexed)     Status: Abnormal   Collection Time: 10/18/19  2:41 AM  Result Value Ref Range Status   Enterococcus species NOT DETECTED NOT DETECTED Final   Listeria monocytogenes NOT DETECTED NOT DETECTED Final   Staphylococcus species DETECTED (A) NOT DETECTED Final    Comment: Methicillin (oxacillin) resistant coagulase negative staphylococcus. Possible blood culture contaminant (unless isolated from more than one blood culture draw or clinical case suggests pathogenicity). No antibiotic treatment is indicated for blood  culture contaminants. CRITICAL RESULT CALLED TO, READ BACK BY AND VERIFIED WITH: ASAJAH DUNCAN ON 10/18/2019 AT 2051 TIK    Staphylococcus aureus (BCID) NOT DETECTED NOT DETECTED Final   Methicillin resistance DETECTED (A) NOT DETECTED Final    Comment: CRITICAL RESULT CALLED TO, READ BACK BY AND VERIFIED WITH: ASAJAH DUNCAN ON 10/18/2019 AT 2051 TIK    Streptococcus species NOT DETECTED NOT DETECTED Final   Streptococcus agalactiae NOT DETECTED NOT DETECTED Final   Streptococcus pneumoniae NOT DETECTED NOT DETECTED Final   Streptococcus pyogenes NOT DETECTED NOT DETECTED Final   Acinetobacter baumannii NOT DETECTED NOT DETECTED Final   Enterobacteriaceae species NOT DETECTED NOT DETECTED Final   Enterobacter cloacae  complex NOT DETECTED NOT DETECTED Final   Escherichia coli NOT DETECTED NOT DETECTED Final   Klebsiella oxytoca NOT DETECTED NOT DETECTED Final   Klebsiella pneumoniae NOT DETECTED NOT DETECTED Final   Proteus species NOT DETECTED NOT DETECTED Final   Serratia marcescens NOT DETECTED NOT DETECTED Final   Haemophilus influenzae NOT DETECTED NOT DETECTED Final   Neisseria meningitidis  NOT DETECTED NOT DETECTED Final   Pseudomonas aeruginosa NOT DETECTED NOT DETECTED Final   Candida albicans NOT DETECTED NOT DETECTED Final   Candida glabrata NOT DETECTED NOT DETECTED Final   Candida krusei NOT DETECTED NOT DETECTED Final   Candida parapsilosis NOT DETECTED NOT DETECTED Final   Candida tropicalis NOT DETECTED NOT DETECTED Final    Comment: Performed at [facility], [address] [place], [place] [address]  MRSA PCR Screening     Status: None   Collection Time: 10/18/19  6:44 PM   Specimen: Nasopharyngeal  Result Value Ref Range Status   MRSA by PCR NEGATIVE NEGATIVE Final    Comment:        The GeneXpert MRSA Assay (FDA approved for NASAL specimens only), is one component of a comprehensive MRSA colonization surveillance program. It is not intended to diagnose MRSA infection nor to guide or monitor treatment for MRSA infections. Performed at [facility], [address] [place], [place] [address]   Culture, blood (Routine X 2) w Reflex to ID Panel     Status: None (Preliminary result)   Collection Time: 10/20/19  2:58 PM   Specimen: BLOOD  Result Value Ref Range Status   Specimen Description BLOOD BLOOD LEFT HAND  Final   Special Requests   Final    BOTTLES DRAWN AEROBIC AND ANAEROBIC Blood Culture adequate volume   Culture   Final    NO GROWTH 4 DAYS Performed at [facility], [address] [place], [place] [address]    Report Status PENDING  Incomplete  Culture, blood (Routine X 2) w Reflex to ID Panel     Status: None (Preliminary result)    Collection Time: 10/20/19  3:05 PM   Specimen: BLOOD  Result Value Ref Range Status   Specimen Description BLOOD BLOOD RIGHT HAND  Final   Special Requests   Final    BOTTLES DRAWN AEROBIC AND ANAEROBIC Blood Culture adequate volume   Culture   Final    NO GROWTH 4 DAYS Performed at [facility], [address] [place], [place] [address]    Report Status PENDING  Incomplete     Studies: CT IMAGE GUIDED DRAINAGE BY PERCUTANEOUS CATHETER  Result Date: 10/24/2019 INDICATION: 44 year old female with a history of abscess EXAM: IMAGE GUIDED ASPIRATION VERSUS DRAINAGE MEDICATIONS: The patient is currently admitted to the hospital and receiving intravenous antibiotics. The antibiotics were administered within an appropriate time frame prior to the initiation of the procedure. ANESTHESIA/SEDATION: 1.0 mg IV Versed 50 mcg IV Fentanyl Moderate Sedation Time:  15 minutes The patient was continuously monitored during the procedure by the interventional radiology nurse under my direct supervision. COMPLICATIONS: None TECHNIQUE: Informed written consent was obtained from the patient after a thorough discussion of the procedural risks, benefits and alternatives. All questions were addressed. Maximal Sterile Barrier Technique was utilized including caps, mask, sterile gowns, sterile gloves, sterile drape, hand hygiene and skin antiseptic. A timeout was performed prior to the initiation of the procedure. Patient positioned supine position on the CT gantry table. Scout CT was acquired for planning purposes. Initial image demonstrates that the fluid collection appears somewhat smaller and redistributed compared to the prior, however, given the persistence, culture may be useful. Aspiration was attempted. Patient is prepped and draped in the usual sterile fashion. 1% lidocaine was used for local anesthesia. A 10 French drain was advanced to the residual fluid and gas adjacent to the bowel. Once we confirmed  the  position, a sterile saline wash was performed for aspiration of fluid. Scant fluid achieved. Culture was sent. Drain was removed given the small size of the collection and the relatively larger size of the drain. Patient tolerated the procedure well and remained hemodynamically stable throughout. No complications were encountered and no significant blood loss. PROCEDURE: The operative field was prepped with chlorhexidine in a sterile fashion, and a sterile drape was applied covering the operative field. A sterile gown and sterile gloves were used for the procedure. Local anesthesia was provided with 1% Lidocaine. FINDINGS: Scout CT demonstrates small residual fluid, which appears improved from the comparison. IMPRESSION: Status post CT-guided aspiration of residual fluid adjacent to the bowel in the mid abdomen. Aspiration only was performed given the improving size of the collection and small size of the collection relative to the drainage capabilities. Signed, Yvone Neu. Reyne Dumas, RPVI [facility] Electronically Signed   By: Gilmer Mor D.O.   On: 10/24/2019 11:37    Scheduled Meds: . feeding supplement  1 Container Oral TID BM  . fentaNYL      . midazolam      . topiramate  100 mg Oral BID   Continuous Infusions: . sodium chloride 250 mL (10/24/19 0603)  . dextrose 5 % and 0.45 % NaCl with KCl 20 mEq/L 100 mL/hr at 10/24/19 1149  . piperacillin-tazobactam 3.375 g (10/24/19 5956)  . vancomycin 750 mg (10/24/19 0117)    Assessment/Plan:  1. Acute diverticulitis with abscess, recurrent.  Patient felt like she never got better from the previous admission.  Continue IV Zosyn.  Patient had an interventional radiology aspiration today.  Continue IV fluids.  General surgery following. 2. Staph hemolyticus bacteremia.  Infectious disease thinks this could be a skin contaminant but recommended continuing 7 days of vancomycin. 3. Psoriatic  arthritis on Cosentyx as outpatient  Code Status:     Code Status Orders  (From admission, onward)         Start     Ordered   10/18/19 0356  Full code  Continuous     10/18/19 0356        Code Status History    Date Active Date Inactive Code Status Order ID Comments User Context   09/16/2019 2045 09/19/2019 1948 Full Code [id number]  Leafy Ro, MD ED   09/03/2019 2108 09/12/2019 1951 Full Code [id number]  Campbell Lerner, MD ED   Advance Care Planning Activity     Family Communication: Spoke with husband on the phone Disposition Plan: With the patient still having quite a bit of pain in the abdomen, likely will need a few more days of IV antibiotics.  Prior to going home will have to tolerate solid food.  Likely the patient will need repeat CT scans as outpatient.  Has per infectious disease, may end up needing IV antibiotics at home.  Consultants:  General surgery  Interventional radiology  Infectious disease  Procedures:  Interventional radiology aspiration of abscess  Antibiotics:  Zosyn  Vancomycin  Time spent: 28 minutes  Melvie Paglia [employer]

## 2019-10-24 NOTE — Progress Notes (Signed)
Pharmacy Antibiotic Note  Sara Chapman is a 44 y.o. female admitted on 10/18/2019 with Intra-abdominal infection.  Pharmacy has been consulted for Unasyn dosing. -pt on Vanc for poss. Bacteremia and ws on Zosyn  Plan: Start Unasyn 3 gm IV q6h  Height: 4\' 11"  (149.9 cm) Weight: 158 lb 8.2 oz (71.9 kg) IBW/kg (Calculated) : 43.2  Temp (24hrs), Avg:98.3 F (36.8 C), Min:98.3 F (36.8 C), Max:98.3 F (36.8 C)  Recent Labs  Lab 10/17/19 2347 10/18/19 0241 10/18/19 0421 10/18/19 0421 10/19/19 0537 10/19/19 0537 10/20/19 0240 10/21/19 0432 10/22/19 0440 10/23/19 0413 10/23/19 2156 10/24/19 0323  WBC   < >  --  12.1*   < > 12.1*  --  9.5 4.8 4.1  --   --  5.9  CREATININE   < >  --   --   --  1.04*   < > 1.20* 1.16* 1.18* 1.27*  --  1.32*  LATICACIDVEN  --  2.5* 2.0*  --   --   --   --   --   --   --   --   --   VANCOTROUGH  --   --   --   --   --   --   --   --   --   --  15  --   VANCOPEAK  --   --   --   --   --   --   --   --   --  51*  --   --    < > = values in this interval not displayed.    Estimated Creatinine Clearance: 46.5 mL/min (A) (by C-G formula based on SCr of 1.32 mg/dL (H)).    Allergies  Allergen Reactions  . Clonazepam Other (See Comments)    SUICIDAL THOUGHTS SUICIDAL THOUGHTS SUICIDAL THOUGHTS   . Ibuprofen Other (See Comments)    stomach ulcers stomach ulcers stomach ulcers stomach ulcers   . Nsaids Other (See Comments)    GI ulcers  . Pseudoephedrine Other (See Comments)    palpitations palpitations   . Tolmetin Other (See Comments)    GI ulcers GI ulcers   . Pseudoephedrine Hcl Other (See Comments) and Palpitations    Unable to tolerate Unable to tolerate   . Tape Rash    Paper tape breaks down the skin and causes sores    Antimicrobials this admission: Pip/tazo 3/20 >> 3/26 Vancomycin 3/21 >>  Doxycycline 3/22 >> 3/25 Unasyn 3/26 >>  Dose adjustments this admission:    Microbiology results: 3/22 BCx: No growth x  24h 3/20 BCx: 3/4 bottles Staphylococcus haemolyticus 3/20 MRSA PCR: negative  Thank you for allowing pharmacy to be a part of this patient's care.  Dow Blahnik A 10/24/2019 6:50 PM

## 2019-10-24 NOTE — Progress Notes (Signed)
*  PRELIMINARY RESULTS* Echocardiogram 2D Echocardiogram has been performed.  Sara Chapman 10/24/2019, 2:43 PM

## 2019-10-24 NOTE — Procedures (Addendum)
Interventional Radiology Procedure Note  Procedure: Image guided aspiration of small residual fluid collection.   Findings: The fluid collection is smaller than prior CT, but persists.  Seems attempt at aspiration might be useful for further culture given persistence.  However, the fluid is smaller than any drain that we stock.    Complications: None Recommendations:  - follow up culture - Ok to shower tomorrow - Do not submerge for 7 days - ok to advance diet from VIR standpoint, per primary order - Routine care   Signed,  Yvone Neu. Loreta Ave, DO

## 2019-10-24 NOTE — Sedation Documentation (Signed)
Total conscious sedation: Versed 1 mg IV, Fentanyl 100 mcg IV. Pt. Tolerated procedure well.

## 2019-10-24 NOTE — Progress Notes (Signed)
ID Sigmoid Diverticular abscess 2/3-2/12 hospitalization Was initially treated with IV zosyn and initially did well and then started having leucocytosis and abdominal pain.2 drain placement on 09/09/19 ( 09/09/19) On 2/9 culture of the abscess was bifidobacterium species. Sent home on PO augmentin. Readmitted on 2/16-2/19  After removal of a drain on 09/16/19- sent home on augmentin. 2nd drain  removed on 3/2 as OP . Was on cipro+ flagyl  Readmitted on 3/20 with increasing pain left lower quadrant. CT scan showed Increased inflammatory change surrounding the left upper quadrant small bowel and the junction of the sigmoid and descending colon,likely indicating worsening/recurrent diverticulitis. Small amounts of free air in the left mid abdomen may have been introduced by the drainage catheter, but recurrent perforation could also cause this Appearance.Blood culture grew 2/2 staph hemolyticus Seen by dr.Fitzgerald On vanco Today she had 10cc of paracolic abscess drained and sent for culture.  Pt c/o abdominal pain BP (!) 143/98   Pulse 79   Temp 98.3 F (36.8 C) (Oral)   Resp 18   Ht 4\' 11"  (1.499 m)   Wt 71.9 kg   LMP 10/15/2019 Comment: neg preg test 3/20  SpO2 95%   BMI 32.02 kg/m    Awake and alert Chest b/l air entry Abd soft- tenderness left side CNS non focal  Labs CBC Latest Ref Rng & Units 10/24/2019 10/22/2019 10/21/2019  WBC 4.0 - 10.5 K/uL 5.9 4.1 4.8  Hemoglobin 12.0 - 15.0 g/dL 10.8(L) 9.0(L) 8.8(L)  Hematocrit 36.0 - 46.0 % 32.7(L) 26.8(L) 26.9(L)  Platelets 150 - 400 K/uL 335 219 198    CMP Latest Ref Rng & Units 10/24/2019 10/23/2019 10/22/2019  Glucose 70 - 99 mg/dL 95 10/24/2019) 300(P)  BUN 6 - 20 mg/dL 5(L) 233(A) <0(T)  Creatinine 0.44 - 1.00 mg/dL <6(A) 2.63(F) 3.54(T)  Sodium 135 - 145 mmol/L 141 141 145  Potassium 3.5 - 5.1 mmol/L 3.7 3.0(L) 3.1(L)  Chloride 98 - 111 mmol/L 113(H) 109 110  CO2 22 - 32 mmol/L 20(L) 24 29  Calcium 8.9 - 10.3 mg/dL 8.2(L) 8.5(L)  8.4(L)  Total Protein 6.5 - 8.1 g/dL - - 5.8(L)  Total Bilirubin 0.3 - 1.2 mg/dL - - 0.6  Alkaline Phos 38 - 126 U/L - - 94  AST 15 - 41 U/L - - 11(L)  ALT 0 - 44 U/L - - 10    Impression/Recommendation  Sigmoid diverticulitis with perforation- failed Po antibiotic and 2 drains  staph hemolyticus bacteremia-  From 3/20 ( 3/22 negative)the source could be the drains which she had for 3 weeks. Need to r/o whether the paracolic abscess is also infected with same bacteria. She did not have any PICC ,neither she has any device 2 d echo done today Abscess aspirated and sent for Culture  today- continue vanco until culture finalizes Change zosyn to ceftriaxone + flagyl or unasyn  Mildly increasing cr- avoid vanco+ zosyn combo  Discussed the management with the patient and her husband and pharmacist

## 2019-10-24 NOTE — Progress Notes (Signed)
*  PRELIMINARY RESULTS* Echocardiogram 2D Echocardiogram has been performed.  Sara Chapman 10/24/2019, 2:44 PM

## 2019-10-25 LAB — CULTURE, BLOOD (ROUTINE X 2)
Culture: NO GROWTH
Culture: NO GROWTH
Special Requests: ADEQUATE
Special Requests: ADEQUATE

## 2019-10-25 MED ORDER — OXYCODONE HCL 5 MG PO TABS: 5.0000 mg | ORAL_TABLET | ORAL | Status: DC | PRN

## 2019-10-25 MED ORDER — MORPHINE SULFATE (PF) 4 MG/ML IV SOLN: 4.0000 mg | INTRAVENOUS | Status: DC | PRN

## 2019-10-25 MED ORDER — ENOXAPARIN SODIUM 40 MG/0.4ML ~~LOC~~ SOLN
40.0000 mg | SUBCUTANEOUS | Status: DC
  Administered 2019-10-25 – 2019-10-26 (×2): 40 mg via SUBCUTANEOUS
  Filled 2019-10-25 (×2): qty 0.4

## 2019-10-25 NOTE — Progress Notes (Signed)
Patient ID: Sara Chapman, female   DOB: 01-04-1976, 44 y.o.   MRN: [id number] [facility] PROGRESS NOTE  Christopher Hink PYP:[id number] DOB: 08/14/1975 DOA: 10/18/2019 PCP: Midge Minium, PA  HPI/Subjective: Patient seen this morning and still not feeling any better.  With worse abdominal pain from where they tried to go in and drain things off yesterday.  Still with nausea.  Unable to tolerate much of the liquids.  Objective: Vitals:   10/25/19 0506 10/25/19 0900  BP: 136/86 (!) 141/98  Pulse: 89 100  Resp:  18  Temp:  98.1 F (36.7 C)  SpO2:  96%    Intake/Output Summary (Last 24 hours) at 10/25/2019 1343 Last data filed at 10/25/2019 1050 Gross per 24 hour  Intake 3977.35 ml  Output --  Net 3977.35 ml   Filed Weights   10/17/19 2339 10/19/19 1700  Weight: 68.9 kg 71.9 kg    ROS: Review of Systems  Constitutional: Negative for chills and fever.  Eyes: Negative for blurred vision.  Respiratory: Negative for cough and shortness of breath.   Cardiovascular: Negative for chest pain.  Gastrointestinal: Positive for abdominal pain and nausea. Negative for constipation and vomiting.  Genitourinary: Negative for dysuria.  Musculoskeletal: Negative for joint pain.  Neurological: Negative for dizziness and headaches.   Exam: Physical Exam  Constitutional: She is oriented to person, place, and time.  HENT:  Nose: No mucosal edema.  Mouth/Throat: No oropharyngeal exudate or posterior oropharyngeal edema.  Eyes: Conjunctivae and lids are normal.  Cardiovascular: S1 normal and S2 normal. Exam reveals no gallop.  No murmur heard. Respiratory: No respiratory distress. She has decreased breath sounds in the right lower field and the left lower field. She has no wheezes. She has no rhonchi. She has no rales.  GI: Soft. Bowel sounds are normal. There is abdominal tenderness.  Musculoskeletal:     Right ankle: No swelling.     Left ankle: No swelling.   Lymphadenopathy:    She has no cervical adenopathy.  Neurological: She is alert and oriented to person, place, and time. No cranial nerve deficit.  Skin: Skin is warm. No rash noted. Nails show no clubbing.  Psychiatric: She has a normal mood and affect.      Data Reviewed: Basic Metabolic Panel: Recent Labs  Lab 10/20/19 0240 10/21/19 0432 10/22/19 0440 10/23/19 0413 10/24/19 0323  NA 139 143 145 141 141  K 3.3* 3.3* 3.1* 3.0* 3.7  CL 104 108 110 109 113*  CO2 28 28 29 24  20*  GLUCOSE 114* 108* 116* 111* 95  BUN 7 <5* <5* <5* 5*  CREATININE 1.20* 1.16* 1.18* 1.27* 1.32*  CALCIUM 7.9* 8.0* 8.4* 8.5* 8.2*  MG  --   --   --  1.5*  --   PHOS  --   --   --  3.4  --    Liver Function Tests: Recent Labs  Lab 10/22/19 0440  AST 11*  ALT 10  ALKPHOS 94  BILITOT 0.6  PROT 5.8*  ALBUMIN 2.4*   No results for input(s): LIPASE, AMYLASE in the last 168 hours. CBC: Recent Labs  Lab 10/19/19 0537 10/20/19 0240 10/21/19 0432 10/22/19 0440 10/24/19 0323  WBC 12.1* 9.5 4.8 4.1 5.9  HGB 11.4* 9.8* 8.8* 9.0* 10.8*  HCT 35.1* 30.6* 26.9* 26.8* 32.7*  MCV 94.6 95.0 95.1 91.8 92.9  PLT 224 217 198 219 335    CBG: Recent Labs  Lab 10/21/19 1735 10/21/19 2016 10/22/19 0024  10/22/19 0405 10/22/19 0742  GLUCAP 84 73 97 121* 91    Recent Results (from the past 240 hour(s))  Respiratory Panel by RT PCR (Flu A&B, Covid) -     Status: None   Collection Time: 10/18/19  2:41 AM  Result Value Ref Range Status   SARS Coronavirus 2 by RT PCR NEGATIVE NEGATIVE Final    Comment: (NOTE) SARS-CoV-2 target nucleic acids are NOT DETECTED. The SARS-CoV-2 RNA is generally detectable in upper respiratoy specimens during the acute phase of infection. The lowest concentration of SARS-CoV-2 viral copies this assay can detect is 131 copies/mL. A negative result does not preclude SARS-Cov-2 infection and should not be used as the sole basis for treatment or other patient management  decisions. A negative result may occur with  improper specimen collection/handling, submission of specimen other than nasopharyngeal swab, presence of viral mutation(s) within the areas targeted by this assay, and inadequate number of viral copies (<131 copies/mL). A negative result must be combined with clinical observations, patient history, and epidemiological information. The expected result is Negative. Fact Sheet for Patients:  https://www.moore.com/https://www.fda.gov/media/142436/download Fact Sheet for Healthcare Providers:  https://www.young.biz/https://www.fda.gov/media/142435/download This test is not yet ap proved or cleared by the [place] FDA and  has been authorized for detection and/or diagnosis of SARS-CoV-2 by FDA under an Emergency Use Authorization (EUA). This EUA will remain  in effect (meaning this test can be used) for the duration of the COVID-19 declaration under Section 564(b)(1) of the Act, 21 U.S.C. section 360bbb-3(b)(1), unless the authorization is terminated or revoked sooner.    Influenza A by PCR NEGATIVE NEGATIVE Final   Influenza B by PCR NEGATIVE NEGATIVE Final    Comment: (NOTE) The Xpert Xpress SARS-CoV-2/FLU/RSV assay is intended as an aid in  the diagnosis of influenza from Nasopharyngeal swab specimens and  should not be used as a sole basis for treatment. Nasal washings and  aspirates are unacceptable for Xpert Xpress SARS-CoV-2/FLU/RSV  testing. Fact Sheet for Patients: https://www.moore.com/https://www.fda.gov/media/142436/download Fact Sheet for Healthcare Providers: https://www.young.biz/https://www.fda.gov/media/142435/download This test is not yet approved or cleared by the [place] FDA and  has been authorized for detection and/or diagnosis of SARS-CoV-2 by  FDA under an Emergency Use Authorization (EUA). This EUA will remain  in effect (meaning this test can be used) for the duration of the  Covid-19 declaration under Section 564(b)(1) of the Act, 21  U.S.C. section 360bbb-3(b)(1), unless the authorization  is  terminated or revoked. Performed at [facility], [address] [place], [place] [address]   Culture, blood (routine x 2)     Status: Abnormal   Collection Time: 10/18/19  2:41 AM   Specimen: BLOOD  Result Value Ref Range Status   Specimen Description   Final    BLOOD RIGHT FOREARM Performed at [facility], [address] [place], [place] [address]    Special Requests   Final    BOTTLES DRAWN AEROBIC AND ANAEROBIC Blood Culture adequate volume Performed at [facility], [address] [place], [place] [address]    Culture  Setup Time   Final    Organism ID to follow GRAM POSITIVE COCCI IN BOTH AEROBIC AND ANAEROBIC BOTTLES CRITICAL RESULT CALLED TO, READ BACK BY AND VERIFIED WITH:  ASAJAH DUNCAN ON 10/18/2019 AT 2051 TIK    Culture (A)  Final    STAPHYLOCOCCUS HAEMOLYTICUS SUSCEPTIBILITIES PERFORMED ON PREVIOUS CULTURE WITHIN THE LAST 5 DAYS. Performed at [facility], [address] [place], [place]  [address]    Report Status 10/21/2019 FINAL  Final  Culture, blood (routine x 2)     Status: Abnormal   Collection Time: 10/18/19  2:41 AM   Specimen: BLOOD  Result Value Ref Range Status   Specimen Description   Final    BLOOD LEFT ANTECUBITAL Performed at [facility], [address], [place], [place] [address]    Special Requests   Final    BOTTLES DRAWN AEROBIC AND ANAEROBIC Blood Culture adequate volume Performed at [facility], [address] [place], [place] [address]    Culture  Setup Time   Final    GRAM POSITIVE COCCI ANAEROBIC BOTTLE ONLY CRITICAL RESULT CALLED TO, READ BACK BY AND VERIFIED WITH: [facility] DUNCAN AT 1742 10/18/19.PMF Performed at [facility], [address] [place], [place] [address]    Culture STAPHYLOCOCCUS HAEMOLYTICUS (A)  Final   Report Status 10/21/2019 FINAL  Final   Organism ID, Bacteria STAPHYLOCOCCUS HAEMOLYTICUS  Final      Susceptibility   Staphylococcus  haemolyticus - MIC*    CIPROFLOXACIN <=0.5 SENSITIVE Sensitive     ERYTHROMYCIN >=8 RESISTANT Resistant     GENTAMICIN <=0.5 SENSITIVE Sensitive     OXACILLIN >=4 RESISTANT Resistant     TETRACYCLINE >=16 RESISTANT Resistant     VANCOMYCIN 1 SENSITIVE Sensitive     TRIMETH/SULFA <=10 SENSITIVE Sensitive     CLINDAMYCIN 1 INTERMEDIATE Intermediate     RIFAMPIN <=0.5 SENSITIVE Sensitive     Inducible Clindamycin NEGATIVE Sensitive     * STAPHYLOCOCCUS HAEMOLYTICUS  Blood Culture ID Panel (Reflexed)     Status: Abnormal   Collection Time: 10/18/19  2:41 AM  Result Value Ref Range Status   Enterococcus species NOT DETECTED NOT DETECTED Final   Listeria monocytogenes NOT DETECTED NOT DETECTED Final   Staphylococcus species DETECTED (A) NOT DETECTED Final    Comment: Methicillin (oxacillin) resistant coagulase negative staphylococcus. Possible blood culture contaminant (unless isolated from more than one blood culture draw or clinical case suggests pathogenicity). No antibiotic treatment is indicated for blood  culture contaminants. CRITICAL RESULT CALLED TO, READ BACK BY AND VERIFIED WITH: ASAJAH DUNCAN ON 10/18/2019 AT 2051 TIK    Staphylococcus aureus (BCID) NOT DETECTED NOT DETECTED Final   Methicillin resistance DETECTED (A) NOT DETECTED Final    Comment: CRITICAL RESULT CALLED TO, READ BACK BY AND VERIFIED WITH: ASAJAH DUNCAN ON 10/18/2019 AT 2051 TIK    Streptococcus species NOT DETECTED NOT DETECTED Final   Streptococcus agalactiae NOT DETECTED NOT DETECTED Final   Streptococcus pneumoniae NOT DETECTED NOT DETECTED Final   Streptococcus pyogenes NOT DETECTED NOT DETECTED Final   Acinetobacter baumannii NOT DETECTED NOT DETECTED Final   Enterobacteriaceae species NOT DETECTED NOT DETECTED Final   Enterobacter cloacae complex NOT DETECTED NOT DETECTED Final   Escherichia coli NOT DETECTED NOT DETECTED Final   Klebsiella oxytoca NOT DETECTED NOT DETECTED Final   Klebsiella  pneumoniae NOT DETECTED NOT DETECTED Final   Proteus species NOT DETECTED NOT DETECTED Final   Serratia marcescens NOT DETECTED NOT DETECTED Final   Haemophilus influenzae NOT DETECTED NOT DETECTED Final   Neisseria meningitidis NOT DETECTED NOT DETECTED Final   Pseudomonas aeruginosa NOT DETECTED NOT DETECTED Final   Candida albicans NOT DETECTED NOT DETECTED Final   Candida glabrata NOT DETECTED NOT DETECTED Final   Candida krusei NOT DETECTED NOT DETECTED Final   Candida parapsilosis NOT DETECTED NOT DETECTED Final   Candida tropicalis NOT DETECTED NOT DETECTED Final  Comment: Performed at [facility], [address] [place], [place] [address]  MRSA PCR Screening     Status: None   Collection Time: 10/18/19  6:44 PM   Specimen: Nasopharyngeal  Result Value Ref Range Status   MRSA by PCR NEGATIVE NEGATIVE Final    Comment:        The GeneXpert MRSA Assay (FDA approved for NASAL specimens only), is one component of a comprehensive MRSA colonization surveillance program. It is not intended to diagnose MRSA infection nor to guide or monitor treatment for MRSA infections. Performed at [facility], [address] [place], [place] [address]   Culture, blood (Routine X 2) w Reflex to ID Panel     Status: None   Collection Time: 10/20/19  2:58 PM   Specimen: BLOOD  Result Value Ref Range Status   Specimen Description BLOOD BLOOD LEFT HAND  Final   Special Requests   Final    BOTTLES DRAWN AEROBIC AND ANAEROBIC Blood Culture adequate volume   Culture   Final    NO GROWTH 5 DAYS Performed at [facility], [address], [place], [place] [address]    Report Status 10/25/2019 FINAL  Final  Culture, blood (Routine X 2) w Reflex to ID Panel     Status: None   Collection Time: 10/20/19  3:05 PM   Specimen: BLOOD  Result Value Ref Range Status   Specimen Description BLOOD BLOOD RIGHT HAND  Final   Special Requests   Final    BOTTLES DRAWN AEROBIC  AND ANAEROBIC Blood Culture adequate volume   Culture   Final    NO GROWTH 5 DAYS Performed at [facility], [address] [place], [place] [address]    Report Status 10/25/2019 FINAL  Final  Aerobic/Anaerobic Culture (surgical/deep wound)     Status: None (Preliminary result)   Collection Time: 10/24/19 10:37 AM   Specimen: Abdomen; Abscess  Result Value Ref Range Status   Specimen Description   Final    ABDOMEN Performed at [facility], [address] [place], [place] [address]    Special Requests   Final    NONE Performed at [facility], [address] [place], [place] [address]    Gram Stain   Final    FEW WBC PRESENT, PREDOMINANTLY MONONUCLEAR NO ORGANISMS SEEN    Culture   Final    NO GROWTH < 24 HOURS Performed at [facility], [address], [place], [place] [address]    Report Status PENDING  Incomplete     Studies: ECHOCARDIOGRAM COMPLETE  Result Date: 10/24/2019    ECHOCARDIOGRAM REPORT   Patient Name:   SHERREY NORTH NGEXB Date of Exam: 10/24/2019 Medical Rec #:  [id number]            Height:       59.0 in Accession #:    [id number]           Weight:       158.5 lb Date of Birth:  15-Jul-1976            BSA:          1.671 m Patient Age:    43 years             BP:           165/93 mmHg Patient Gender: F  HR:           82 bpm. Exam Location:  [facility] Procedure: 2D Echo, Cardiac Doppler and Color Doppler Indications:     Bacteremia 790.7  History:         Patient has no prior history of Echocardiogram examinations. No                  cardiac history listed.  Sonographer:     Cristela Blue RDCS (AE) Referring Phys:  [id number] DAVID P FITZGERALD Diagnosing Phys: Julien Nordmann MD IMPRESSIONS  1. Left ventricular ejection fraction, by estimation, is 60 to 65%. The left ventricle has normal function. The left ventricle has no regional wall motion abnormalities. Left ventricular diastolic parameters were normal.  2. Right  ventricular systolic function is normal. The right ventricular size is normal. There is normal pulmonary artery systolic pressure.  3. no valve vegetation noted FINDINGS  Left Ventricle: Left ventricular ejection fraction, by estimation, is 60 to 65%. The left ventricle has normal function. The left ventricle has no regional wall motion abnormalities. The left ventricular internal cavity size was normal in size. There is  no left ventricular hypertrophy. Left ventricular diastolic parameters were normal. Right Ventricle: The right ventricular size is normal. No increase in right ventricular wall thickness. Right ventricular systolic function is normal. There is normal pulmonary artery systolic pressure. The tricuspid regurgitant velocity is 2.36 m/s, and  with an assumed right atrial pressure of 5 mmHg, the estimated right ventricular systolic pressure is 27.3 mmHg. Left Atrium: Left atrial size was normal in size. Right Atrium: Right atrial size was normal in size. Pericardium: There is no evidence of pericardial effusion. Mitral Valve: The mitral valve is normal in structure. Normal mobility of the mitral valve leaflets. Mild mitral valve regurgitation. No evidence of mitral valve stenosis. Tricuspid Valve: The tricuspid valve is normal in structure. Tricuspid valve regurgitation is not demonstrated. No evidence of tricuspid stenosis. Aortic Valve: The aortic valve is normal in structure. Aortic valve regurgitation is not visualized. No aortic stenosis is present. Aortic valve mean gradient measures 1.5 mmHg. Aortic valve peak gradient measures 2.8 mmHg. Aortic valve area, by VTI measures 3.42 cm. Pulmonic Valve: The pulmonic valve was normal in structure. Pulmonic valve regurgitation is not visualized. No evidence of pulmonic stenosis. Aorta: The aortic root is normal in size and structure. Venous: The inferior vena cava is normal in size with greater than 50% respiratory variability, suggesting right atrial  pressure of 3 mmHg. IAS/Shunts: No atrial level shunt detected by color flow Doppler.  LEFT VENTRICLE PLAX 2D LVIDd:         3.87 cm  Diastology LVIDs:         2.31 cm  LV e' lateral:   8.38 cm/s LV PW:         0.86 cm  LV E/e' lateral: 7.3 LV IVS:        0.70 cm  LV e' medial:    4.57 cm/s LVOT diam:     2.00 cm  LV E/e' medial:  13.4 LV SV:         55 LV SV Index:   33 LVOT Area:     3.14 cm  RIGHT VENTRICLE RV Basal diam:  2.87 cm TAPSE (M-mode): 2.6 cm LEFT ATRIUM           Index       RIGHT ATRIUM          Index LA diam:  3.60 cm 2.15 cm/m  RA Area:     6.46 cm LA Vol (A2C): 15.2 ml 9.10 ml/m  RA Volume:   9.94 ml  5.95 ml/m LA Vol (A4C): 28.5 ml 17.06 ml/m  AORTIC VALVE                   PULMONIC VALVE AV Area (Vmax):    3.18 cm    PV Vmax:        0.95 m/s AV Area (Vmean):   2.89 cm    PV Peak grad:   3.6 mmHg AV Area (VTI):     3.42 cm    RVOT Peak grad: 3 mmHg AV Vmax:           83.90 cm/s AV Vmean:          60.600 cm/s AV VTI:            0.160 m AV Peak Grad:      2.8 mmHg AV Mean Grad:      1.5 mmHg LVOT Vmax:         85.00 cm/s LVOT Vmean:        55.700 cm/s LVOT VTI:          0.174 m LVOT/AV VTI ratio: 1.09  AORTA Ao Root diam: 2.60 cm MITRAL VALVE               TRICUSPID VALVE MV Area (PHT): 4.21 cm    TR Peak grad:   22.3 mmHg MV Decel Time: 180 msec    TR Vmax:        236.00 cm/s MV E velocity: 61.10 cm/s MV A velocity: 50.95 cm/s  SHUNTS MV E/A ratio:  1.20        Systemic VTI:  0.17 m                            Systemic Diam: 2.00 cm Julien Nordmann MD Electronically signed by Julien Nordmann MD Signature Date/Time: 10/24/2019/8:25:28 PM    Final    CT IMAGE GUIDED DRAINAGE BY PERCUTANEOUS CATHETER  Result Date: 10/24/2019 INDICATION: 44 year old female with a history of abscess EXAM: IMAGE GUIDED ASPIRATION VERSUS DRAINAGE MEDICATIONS: The patient is currently admitted to the hospital and receiving intravenous antibiotics. The antibiotics were administered within an appropriate time  frame prior to the initiation of the procedure. ANESTHESIA/SEDATION: 1.0 mg IV Versed 50 mcg IV Fentanyl Moderate Sedation Time:  15 minutes The patient was continuously monitored during the procedure by the interventional radiology nurse under my direct supervision. COMPLICATIONS: None TECHNIQUE: Informed written consent was obtained from the patient after a thorough discussion of the procedural risks, benefits and alternatives. All questions were addressed. Maximal Sterile Barrier Technique was utilized including caps, mask, sterile gowns, sterile gloves, sterile drape, hand hygiene and skin antiseptic. A timeout was performed prior to the initiation of the procedure. Patient positioned supine position on the CT gantry table. Scout CT was acquired for planning purposes. Initial image demonstrates that the fluid collection appears somewhat smaller and redistributed compared to the prior, however, given the persistence, culture may be useful. Aspiration was attempted. Patient is prepped and draped in the usual sterile fashion. 1% lidocaine was used for local anesthesia. A 10 French drain was advanced to the residual fluid and gas adjacent to the bowel. Once we confirmed the position, a sterile saline wash was performed for aspiration of fluid. Scant fluid achieved. Culture was sent. Drain was  removed given the small size of the collection and the relatively larger size of the drain. Patient tolerated the procedure well and remained hemodynamically stable throughout. No complications were encountered and no significant blood loss. PROCEDURE: The operative field was prepped with chlorhexidine in a sterile fashion, and a sterile drape was applied covering the operative field. A sterile gown and sterile gloves were used for the procedure. Local anesthesia was provided with 1% Lidocaine. FINDINGS: Scout CT demonstrates small residual fluid, which appears improved from the comparison. IMPRESSION: Status post CT-guided  aspiration of residual fluid adjacent to the bowel in the mid abdomen. Aspiration only was performed given the improving size of the collection and small size of the collection relative to the drainage capabilities. Signed, Yvone Neu. Reyne Dumas, RPVI [facility] Electronically Signed   By: Gilmer Mor D.O.   On: 10/24/2019 11:37    Scheduled Meds: . feeding supplement  1 Container Oral TID BM  . topiramate  100 mg Oral BID   Continuous Infusions: . sodium chloride Stopped (10/24/19 2007)  . ampicillin-sulbactam (UNASYN) IV 3 g (10/25/19 1305)  . dextrose 5 % and 0.45 % NaCl with KCl 20 mEq/L 100 mL/hr at 10/25/19 1303  . vancomycin Stopped (10/25/19 0010)    Assessment/Plan:  1. Acute diverticulitis with abscess, recurrent.  Patient felt like she never got better from the previous admission.  Patient with worsening pain after abscess aspiration yesterday.  Continue IV Zosyn.  Continue IV fluids.  General surgery following.  Add oral pain medications to IV pain medication.  As needed nausea medications.  Still on clear liquid diet and unable to tolerate at this point 2. Staph hemolyticus bacteremia.  Infectious disease thinks this could be a skin contaminant but recommended continuing 7 days of vancomycin. 3. Psoriatic arthritis on Cosentyx as outpatient  Code Status:     Code Status Orders  (From admission, onward)         Start     Ordered   10/18/19 0356  Full code  Continuous     10/18/19 0356        Code Status History    Date Active Date Inactive Code Status Order ID Comments User Context   09/16/2019 2045 09/19/2019 1948 Full Code [id number]  Leafy Ro, MD ED   09/03/2019 2108 09/12/2019 1951 Full Code [id number]  Campbell Lerner, MD ED   Advance Care Planning Activity     Family Communication: Spoke with husband on the phone and he is concerned that the patient is nauseous because she is letting her pain get to  much. Disposition Plan: Patient with a lot of pain in her abdomen.  Still with nausea.  Unable to tolerate liquid diet today.  Prior to going home will have to tolerate solid food.  I think she will need a few more days of IV antibiotics.  Consultants:  General surgery  Interventional radiology  Infectious disease  Procedures:  Interventional radiology aspiration of abscess  Antibiotics:  Zosyn  Vancomycin  Time spent: 27 minutes  Khyran [employer]

## 2019-10-25 NOTE — Progress Notes (Addendum)
[facility] SURGICAL PROGRESS NOTE (cpt [phone])  Hospital Day(s): 7.   Interval History:  Patient seen and examined no acute events or new complaints overnight.  Patient reports she is feeling better, still focused on tenderness from percutaneous drain/aspiration attempt. Nausea, reluctant to have clear liquids today, yet reports response to antiemetics.  Denies emesis, or fevers.   No leukocytosis; afebrile Encourage p.o. intake, awaiting culture and sensitivity of aspirate.  Will explore options of home IV antibiotics.  Review of Systems:  Constitutional: denies fever, chills  HEENT: denies cough or congestion  Respiratory: denies any shortness of breath  Cardiovascular: denies chest pain or palpitations  Gastrointestinal: + abdominal pain (clinically improving), denied V, or diarrhea/and bowel function as per interval history Genitourinary: denies burning with urination or urinary frequency   Vital signs in last 24 hours: [min-max] current  Temp:  [97.9 F (36.6 C)] 97.9 F (36.6 C) (03/27 0418) Pulse Rate:  [75-89] 89 (03/27 0506) Resp:  [16-20] 17 (03/27 0418) BP: (136-192)/(86-116) 136/86 (03/27 0506) SpO2:  [94 %-100 %] 98 % (03/27 0418)     Height: 4\' 11"  (149.9 cm) Weight: 71.9 kg BMI (Calculated): 32   Intake/Output last 2 shifts:  03/26 0701 - 03/27 0700 In: 3205.8 [I.V.:2482.8; IV Piggyback:723] Out: -    Physical Exam:  Constitutional: alert, cooperative and no distress  HENT: normocephalic without obvious abnormality  Eyes: PERRL, EOM's grossly intact and symmetric  Respiratory: breathing non-labored at rest  Cardiovascular: regular rate and sinus rhythm  Gastrointestinal: Soft, fairly localized tenderness to Band-Aid overlying work site.   non-distended, no rebound/guarding, clearly not peritonitic Musculoskeletal: no edema or wounds, motor and sensation grossly intact, NT    Labs:  CBC Latest Ref Rng & Units 10/24/2019 10/22/2019 10/21/2019   WBC 4.0 - 10.5 K/uL 5.9 4.1 4.8  Hemoglobin 12.0 - 15.0 g/dL 10.8(L) 9.0(L) 8.8(L)  Hematocrit 36.0 - 46.0 % 32.7(L) 26.8(L) 26.9(L)  Platelets 150 - 400 K/uL 335 219 198   CMP Latest Ref Rng & Units 10/24/2019 10/23/2019 10/22/2019  Glucose 70 - 99 mg/dL 95 10/24/2019) 546(E)  BUN 6 - 20 mg/dL 5(L) 703(J) <0(K)  Creatinine 0.44 - 1.00 mg/dL <9(F) 8.18(E) 9.93(Z)  Sodium 135 - 145 mmol/L 141 141 145  Potassium 3.5 - 5.1 mmol/L 3.7 3.0(L) 3.1(L)  Chloride 98 - 111 mmol/L 113(H) 109 110  CO2 22 - 32 mmol/L 20(L) 24 29  Calcium 8.9 - 10.3 mg/dL 8.2(L) 8.5(L) 8.4(L)  Total Protein 6.5 - 8.1 g/dL - - 5.8(L)  Total Bilirubin 0.3 - 1.2 mg/dL - - 0.6  Alkaline Phos 38 - 126 U/L - - 94  AST 15 - 41 U/L - - 11(L)  ALT 0 - 44 U/L - - 10     Imaging studies: No new pertinent imaging studies   Assessment/Plan: (ICD-10's: K61.92) 44 y.o. female with persistent, but somewhat improvement, abdominal pain attributable to recurrent -vs- unresolved diverticulitis with resolution in extraluminal air although now with intra-abdominal abscess.    - CLD  --> Her PO intake has been minimal.   - Continue IV Abx Vanc and Unasyn; ID also involved  - Pain control prn; antiemetics prn             - serial abdominal examination             - I do not think she warrants any emergent surgical intervention; we have discussed the possibility of minimally invasive surgical intervention this admission, but at this point  her and her family would like to continue conservative management   - Mobilization encouraged              - Further management per primary team             - DVT prophylaxis   -- Ronny Bacon, PA-C [facility] 10/25/2019, 8:30 AM [phone] M-F: 7am - 4pm

## 2019-10-25 NOTE — Progress Notes (Signed)
ID Spoke to patient on the phone She says she is doing a little better Pain abdomen better Had a bowel movt Wants to eat mashed potatoes instead of jello and she has told the nurse Patient Vitals for the past 24 hrs:  BP Temp Temp src Pulse Resp SpO2  10/25/19 0900 (!) 141/98 98.1 F (36.7 C) Oral 100 18 96 %  10/25/19 0506 136/86 -- -- 89 -- --  10/25/19 0431 (!) 164/105 -- -- 82 -- --  10/25/19 0418 (!) 180/114 97.9 F (36.6 C) Oral 75 17 98 %  10/24/19 2335 (!) 166/94 -- -- 79 -- 98 %  10/24/19 2329 (!) 171/114 97.9 F (36.6 C) Oral 83 16 99 %  10/24/19 1522 (!) 143/98 -- -- 79 18 95 %    Impression/recommendation  Complicated sigmoid diverticulitis with abscess- ongoing since feb S/p paracolic abscess aspiration on 3/26- culture NG so far Continue Unasyn   Staph hemolyticus bacteremia 2/2 -true pathogen due to drain related VS skin contaminant Await paracolic abscess culture- if positive for staph hemolyticus will give 2 weeks total of vanco. If beg then 7-10 vanco is adequate   Mild increase in cr- will check cr   Discussed  the management with the patient

## 2019-10-26 LAB — COMPREHENSIVE METABOLIC PANEL
ALT: 19 U/L (ref 0–44)
AST: 14 U/L — ABNORMAL LOW (ref 15–41)
Albumin: 2.4 g/dL — ABNORMAL LOW (ref 3.5–5.0)
Alkaline Phosphatase: 99 U/L (ref 38–126)
Anion gap: 9 (ref 5–15)
BUN: 5 mg/dL — ABNORMAL LOW (ref 6–20)
CO2: 15 mmol/L — ABNORMAL LOW (ref 22–32)
Calcium: 8.5 mg/dL — ABNORMAL LOW (ref 8.9–10.3)
Chloride: 115 mmol/L — ABNORMAL HIGH (ref 98–111)
Creatinine, Ser: 1.25 mg/dL — ABNORMAL HIGH (ref 0.44–1.00)
GFR calc Af Amer: >60 mL/min (ref >60)
GFR calc non Af Amer: 52 mL/min — ABNORMAL LOW (ref >60)
Glucose, Bld: 97 mg/dL (ref 70–99)
Potassium: 4 mmol/L (ref 3.5–5.1)
Sodium: 139 mmol/L (ref 135–145)
Total Bilirubin: 0.5 mg/dL (ref 0.3–1.2)
Total Protein: 6.1 g/dL — ABNORMAL LOW (ref 6.5–8.1)

## 2019-10-26 LAB — CBC WITH DIFFERENTIAL/PLATELET
Abs Immature Granulocytes: 0.03 10*3/uL (ref 0.00–0.07)
Basophils Absolute: 0.1 10*3/uL (ref 0.0–0.1)
Basophils Relative: 1 %
Eosinophils Absolute: 0.1 10*3/uL (ref 0.0–0.5)
Eosinophils Relative: 2 %
HCT: 34.5 % — ABNORMAL LOW (ref 36.0–46.0)
Hemoglobin: 11.4 g/dL — ABNORMAL LOW (ref 12.0–15.0)
Immature Granulocytes: 1 %
Lymphocytes Relative: 28 %
Lymphs Abs: 1.8 10*3/uL (ref 0.7–4.0)
MCH: 30.6 pg (ref 26.0–34.0)
MCHC: 33 g/dL (ref 30.0–36.0)
MCV: 92.5 fL (ref 80.0–100.0)
Monocytes Absolute: 0.4 10*3/uL (ref 0.1–1.0)
Monocytes Relative: 6 %
Neutro Abs: 3.9 10*3/uL (ref 1.7–7.7)
Neutrophils Relative %: 62 %
Platelets: 390 10*3/uL (ref 150–400)
RBC: 3.73 MIL/uL — ABNORMAL LOW (ref 3.87–5.11)
RDW: 13.3 % (ref 11.5–15.5)
WBC: 6.3 10*3/uL (ref 4.0–10.5)
nRBC: 0 % (ref 0.0–0.2)

## 2019-10-26 MED ORDER — LOPERAMIDE HCL 2 MG PO CAPS
2.0000 mg | ORAL_CAPSULE | Freq: Once | ORAL | Status: AC
  Administered 2019-10-26: 2 mg via ORAL
  Filled 2019-10-26: qty 1

## 2019-10-26 MED ORDER — TRAMADOL HCL 50 MG PO TABS: 50.0000 mg | ORAL_TABLET | Freq: Four times a day (QID) | ORAL | Status: DC | PRN

## 2019-10-26 MED ORDER — METRONIDAZOLE 500 MG PO TABS
500.0000 mg | ORAL_TABLET | Freq: Three times a day (TID) | ORAL | Status: DC
  Administered 2019-10-26 – 2019-10-27 (×4): 500 mg via ORAL
  Filled 2019-10-26 (×4): qty 1

## 2019-10-26 MED ORDER — LOPERAMIDE HCL 2 MG PO CAPS
2.0000 mg | ORAL_CAPSULE | ORAL | Status: DC | PRN
  Filled 2019-10-26: qty 1

## 2019-10-26 MED ORDER — SODIUM CHLORIDE 0.9 % IV SOLN
2.0000 g | INTRAVENOUS | Status: DC
  Administered 2019-10-26 – 2019-10-27 (×2): 2 g via INTRAVENOUS
  Filled 2019-10-26: qty 20
  Filled 2019-10-26: qty 2
  Filled 2019-10-26: qty 20

## 2019-10-26 MED ORDER — TOPIRAMATE 25 MG PO TABS
50.0000 mg | ORAL_TABLET | Freq: Two times a day (BID) | ORAL | Status: DC
  Administered 2019-10-26 – 2019-10-27 (×2): 50 mg via ORAL
  Filled 2019-10-26 (×3): qty 2

## 2019-10-26 NOTE — Progress Notes (Signed)
Date of Admission:  10/18/2019     ID: Sanye Ledesma is a 44 y.o. female  Principal Problem:   Sepsis (HCC) Active Problems:   Acute diverticulitis   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HCC)   Psoriatic arthritis (HCC)    Subjective: Has multiple loose stools Started unasyn 2 days ago and likely due to it  Medications:  . enoxaparin (LOVENOX) injection  40 mg Subcutaneous Q24H  . feeding supplement  1 Container Oral TID BM  . topiramate  100 mg Oral BID    Objective: Vital signs in last 24 hours: Temp:  [98.2 F (36.8 C)-98.6 F (37 C)] 98.2 F (36.8 C) (03/28 0600) Pulse Rate:  [75-109] 83 (03/28 0904) Resp:  [16-18] 18 (03/28 0904) BP: (139-181)/(80-107) 156/96 (03/28 0904) SpO2:  [95 %-100 %] 97 % (03/28 0621)  PHYSICAL EXAM:  General: Alert, cooperative, no distress, appears stated age.  Head: Normocephalic, without obvious abnormality, atraumatic. Eyes: Conjunctivae clear, anicteric sclerae. Pupils are equal ENT Nares normal. No drainage or sinus tenderness. Lips, mucosa, and tongue normal. No Thrush Neck: Supple, symmetrical, no adenopathy, thyroid: non tender no carotid bruit and no JVD. Back: No CVA tenderness. Lungs: Clear to auscultation bilaterally. No Wheezing or Rhonchi. No rales. Heart: Regular rate and rhythm, no murmur, rub or gallop. Abdomen: Soft, non-tender,not distended. Bowel sounds normal. No masses Extremities: atraumatic, no cyanosis. No edema. No clubbing Skin: No rashes or lesions. Or bruising Lymph: Cervical, supraclavicular normal. Neurologic: Grossly non-focal  Lab Results Recent Labs    10/24/19 0323 10/26/19 0619  WBC 5.9 6.3  HGB 10.8* 11.4*  HCT 32.7* 34.5*  NA 141 139  K 3.7 4.0  CL 113* 115*  CO2 20* 15*  BUN 5* 5*  CREATININE 1.32* 1.25*   Liver Panel Recent Labs    10/26/19 0619  PROT 6.1*  ALBUMIN 2.4*  AST 14*  ALT 19  ALKPHOS 99  BILITOT 0.5   Sedimentation Rate No results for input(s):  ESRSEDRATE in the last 72 hours. C-Reactive Protein No results for input(s): CRP in the last 72 hours.  Microbiology:  Studies/Results: ECHOCARDIOGRAM COMPLETE  Result Date: 10/24/2019    ECHOCARDIOGRAM REPORT   Patient Name:   DORTHA NEIGHBORS KHTXH Date of Exam: 10/24/2019 Medical Rec #:  [id number]            Height:       59.0 in Accession #:    [id number]           Weight:       158.5 lb Date of Birth:  1975/12/01            BSA:          1.671 m Patient Age:    43 years             BP:           165/93 mmHg Patient Gender: F                    HR:           82 bpm. Exam Location:  [facility] Procedure: 2D Echo, Cardiac Doppler and Color Doppler Indications:     Bacteremia 790.7  History:         Patient has no prior history of Echocardiogram examinations. No                  cardiac history listed.  Sonographer:     Cristela Blue RDCS (AE)  Referring Phys:  [id number] DAVID P FITZGERALD Diagnosing Phys: Julien Nordmann MD IMPRESSIONS  1. Left ventricular ejection fraction, by estimation, is 60 to 65%. The left ventricle has normal function. The left ventricle has no regional wall motion abnormalities. Left ventricular diastolic parameters were normal.  2. Right ventricular systolic function is normal. The right ventricular size is normal. There is normal pulmonary artery systolic pressure.  3. no valve vegetation noted FINDINGS  Left Ventricle: Left ventricular ejection fraction, by estimation, is 60 to 65%. The left ventricle has normal function. The left ventricle has no regional wall motion abnormalities. The left ventricular internal cavity size was normal in size. There is  no left ventricular hypertrophy. Left ventricular diastolic parameters were normal. Right Ventricle: The right ventricular size is normal. No increase in right ventricular wall thickness. Right ventricular systolic function is normal. There is normal pulmonary artery systolic pressure. The tricuspid regurgitant velocity is 2.36 m/s, and  with an  assumed right atrial pressure of 5 mmHg, the estimated right ventricular systolic pressure is 27.3 mmHg. Left Atrium: Left atrial size was normal in size. Right Atrium: Right atrial size was normal in size. Pericardium: There is no evidence of pericardial effusion. Mitral Valve: The mitral valve is normal in structure. Normal mobility of the mitral valve leaflets. Mild mitral valve regurgitation. No evidence of mitral valve stenosis. Tricuspid Valve: The tricuspid valve is normal in structure. Tricuspid valve regurgitation is not demonstrated. No evidence of tricuspid stenosis. Aortic Valve: The aortic valve is normal in structure. Aortic valve regurgitation is not visualized. No aortic stenosis is present. Aortic valve mean gradient measures 1.5 mmHg. Aortic valve peak gradient measures 2.8 mmHg. Aortic valve area, by VTI measures 3.42 cm. Pulmonic Valve: The pulmonic valve was normal in structure. Pulmonic valve regurgitation is not visualized. No evidence of pulmonic stenosis. Aorta: The aortic root is normal in size and structure. Venous: The inferior vena cava is normal in size with greater than 50% respiratory variability, suggesting right atrial pressure of 3 mmHg. IAS/Shunts: No atrial level shunt detected by color flow Doppler.  LEFT VENTRICLE PLAX 2D LVIDd:         3.87 cm  Diastology LVIDs:         2.31 cm  LV e' lateral:   8.38 cm/s LV PW:         0.86 cm  LV E/e' lateral: 7.3 LV IVS:        0.70 cm  LV e' medial:    4.57 cm/s LVOT diam:     2.00 cm  LV E/e' medial:  13.4 LV SV:         55 LV SV Index:   33 LVOT Area:     3.14 cm  RIGHT VENTRICLE RV Basal diam:  2.87 cm TAPSE (M-mode): 2.6 cm LEFT ATRIUM           Index       RIGHT ATRIUM          Index LA diam:      3.60 cm 2.15 cm/m  RA Area:     6.46 cm LA Vol (A2C): 15.2 ml 9.10 ml/m  RA Volume:   9.94 ml  5.95 ml/m LA Vol (A4C): 28.5 ml 17.06 ml/m  AORTIC VALVE                   PULMONIC VALVE AV Area (Vmax):    3.18 cm    PV Vmax:  0.95 m/s AV Area (Vmean):   2.89 cm    PV Peak grad:   3.6 mmHg AV Area (VTI):     3.42 cm    RVOT Peak grad: 3 mmHg AV Vmax:           83.90 cm/s AV Vmean:          60.600 cm/s AV VTI:            0.160 m AV Peak Grad:      2.8 mmHg AV Mean Grad:      1.5 mmHg LVOT Vmax:         85.00 cm/s LVOT Vmean:        55.700 cm/s LVOT VTI:          0.174 m LVOT/AV VTI ratio: 1.09  AORTA Ao Root diam: 2.60 cm MITRAL VALVE               TRICUSPID VALVE MV Area (PHT): 4.21 cm    TR Peak grad:   22.3 mmHg MV Decel Time: 180 msec    TR Vmax:        236.00 cm/s MV E velocity: 61.10 cm/s MV A velocity: 50.95 cm/s  SHUNTS MV E/A ratio:  1.20        Systemic VTI:  0.17 m                            Systemic Diam: 2.00 cm Ida Rogue MD Electronically signed by Ida Rogue MD Signature Date/Time: 10/24/2019/8:25:28 PM    Final    CT IMAGE GUIDED DRAINAGE BY PERCUTANEOUS CATHETER  Result Date: 10/24/2019 INDICATION: 44 year old female with a history of abscess EXAM: IMAGE GUIDED ASPIRATION VERSUS DRAINAGE MEDICATIONS: The patient is currently admitted to the hospital and receiving intravenous antibiotics. The antibiotics were administered within an appropriate time frame prior to the initiation of the procedure. ANESTHESIA/SEDATION: 1.0 mg IV Versed 50 mcg IV Fentanyl Moderate Sedation Time:  15 minutes The patient was continuously monitored during the procedure by the interventional radiology nurse under my direct supervision. COMPLICATIONS: None TECHNIQUE: Informed written consent was obtained from the patient after a thorough discussion of the procedural risks, benefits and alternatives. All questions were addressed. Maximal Sterile Barrier Technique was utilized including caps, mask, sterile gowns, sterile gloves, sterile drape, hand hygiene and skin antiseptic. A timeout was performed prior to the initiation of the procedure. Patient positioned supine position on the CT gantry table. Scout CT was acquired for planning  purposes. Initial image demonstrates that the fluid collection appears somewhat smaller and redistributed compared to the prior, however, given the persistence, culture may be useful. Aspiration was attempted. Patient is prepped and draped in the usual sterile fashion. 1% lidocaine was used for local anesthesia. A 10 French drain was advanced to the residual fluid and gas adjacent to the bowel. Once we confirmed the position, a sterile saline wash was performed for aspiration of fluid. Scant fluid achieved. Culture was sent. Drain was removed given the small size of the collection and the relatively larger size of the drain. Patient tolerated the procedure well and remained hemodynamically stable throughout. No complications were encountered and no significant blood loss. PROCEDURE: The operative field was prepped with chlorhexidine in a sterile fashion, and a sterile drape was applied covering the operative field. A sterile gown and sterile gloves were used for the procedure. Local anesthesia was provided with 1% Lidocaine. FINDINGS: Scout CT demonstrates small  residual fluid, which appears improved from the comparison. IMPRESSION: Status post CT-guided aspiration of residual fluid adjacent to the bowel in the mid abdomen. Aspiration only was performed given the improving size of the collection and small size of the collection relative to the drainage capabilities. Signed, Yvone Neu. Reyne Dumas, RPVI [facility] Radiology Electronically Signed   By: Gilmer Mor D.O.   On: 10/24/2019 11:37     Assessment/Plan: Complicated sigmoid diverticulitis with abscess- ongoing since feb S/p paracolic abscess aspiration on 3/26- culture NG so far Zosyn was switched to unasyn on 3/26 - diarrhea- antibiotic related- will Dc unasyn and switch to ceftriaxone + flagyl   Staph hemolyticus bacteremia 2/2 -true pathogen due to drain related VS skin contaminant Await  paracolic abscess culture- if positive for staph hemolyticus will give 2 weeks total of vanco. If negative then will Dc vanco as she has already received 8 days  Discussed with patient and Care team regarding antibiotics on discharge Will need IV ceftriaxone and PO flagyl on discharge very likely Will give final recommendations tomorrow after culture is resulted

## 2019-10-26 NOTE — Progress Notes (Signed)
Pt states that she has had 8 watery stools since midnight, requesting imodium, Dr Renae Gloss aware and place order for imodium

## 2019-10-26 NOTE — Progress Notes (Signed)
Patient ID: Sara Chapman, female   DOB: [birth year], 44 y.o.   MRN: [id number] [facility] PROGRESS NOTE  Sara Chapman NIO:[id number] DOB: 1976-04-22 DOA: 10/18/2019 PCP: Midge Minium, PA  HPI/Subjective: Patient today asked me if she can go home.  She states she is feeling a little bit better.  Abdominal pain a little bit less.  Did vomit last night.  Patient having quite a bit of diarrhea this morning.  Objective: Vitals:   10/26/19 0621 10/26/19 0904  BP: (!) 154/101 (!) 156/96  Pulse: (!) 106 83  Resp: 16 18  Temp: 98.2 F (36.8 C)   SpO2: 97%     Intake/Output Summary (Last 24 hours) at 10/26/2019 1222 Last data filed at 10/25/2019 1915 Gross per 24 hour  Intake 1028.72 ml  Output --  Net 1028.72 ml   Filed Weights   10/17/19 2339 10/19/19 1700  Weight: 68.9 kg 71.9 kg    ROS: Review of Systems  Constitutional: Negative for chills and fever.  Eyes: Negative for blurred vision.  Respiratory: Negative for cough and shortness of breath.   Cardiovascular: Negative for chest pain.  Gastrointestinal: Positive for abdominal pain, diarrhea, nausea and vomiting. Negative for constipation.  Genitourinary: Negative for dysuria.  Musculoskeletal: Negative for joint pain.  Neurological: Negative for dizziness and headaches.   Exam: Physical Exam  Constitutional: She is oriented to person, place, and time.  HENT:  Nose: No mucosal edema.  Mouth/Throat: No oropharyngeal exudate or posterior oropharyngeal edema.  Eyes: Conjunctivae and lids are normal.  Cardiovascular: S1 normal and S2 normal. Exam reveals no gallop.  No murmur heard. Respiratory: No respiratory distress. She has decreased breath sounds in the right lower field and the left lower field. She has no wheezes. She has no rhonchi. She has no rales.  GI: Soft. Bowel sounds are normal. There is abdominal tenderness.  Musculoskeletal:     Right ankle: No swelling.     Left ankle: No  swelling.  Lymphadenopathy:    She has no cervical adenopathy.  Neurological: She is alert and oriented to person, place, and time. No cranial nerve deficit.  Skin: Skin is warm. No rash noted. Nails show no clubbing.  Psychiatric: She has a normal mood and affect.      Data Reviewed: Basic Metabolic Panel: Recent Labs  Lab 10/21/19 0432 10/22/19 0440 10/23/19 0413 10/24/19 0323 10/26/19 0619  NA 143 145 141 141 139  K 3.3* 3.1* 3.0* 3.7 4.0  CL 108 110 109 113* 115*  CO2 28 29 24  20* 15*  GLUCOSE 108* 116* 111* 95 97  BUN <5* <5* <5* 5* 5*  CREATININE 1.16* 1.18* 1.27* 1.32* 1.25*  CALCIUM 8.0* 8.4* 8.5* 8.2* 8.5*  MG  --   --  1.5*  --   --   PHOS  --   --  3.4  --   --    Liver Function Tests: Recent Labs  Lab 10/22/19 0440 10/26/19 0619  AST 11* 14*  ALT 10 19  ALKPHOS 94 99  BILITOT 0.6 0.5  PROT 5.8* 6.1*  ALBUMIN 2.4* 2.4*   CBC: Recent Labs  Lab 10/20/19 0240 10/21/19 0432 10/22/19 0440 10/24/19 0323 10/26/19 0619  WBC 9.5 4.8 4.1 5.9 6.3  NEUTROABS  --   --   --   --  3.9  HGB 9.8* 8.8* 9.0* 10.8* 11.4*  HCT 30.6* 26.9* 26.8* 32.7* 34.5*  MCV 95.0 95.1 91.8 92.9 92.5  PLT 217 198 219  335 390    CBG: Recent Labs  Lab 10/21/19 1735 10/21/19 2016 10/22/19 0024 10/22/19 0405 10/22/19 0742  GLUCAP 84 73 97 121* 91    Recent Results (from the past 240 hour(s))  Respiratory Panel by RT PCR (Flu A&B, Covid) -     Status: None   Collection Time: 10/18/19  2:41 AM  Result Value Ref Range Status   SARS Coronavirus 2 by RT PCR NEGATIVE NEGATIVE Final    Comment: (NOTE) SARS-CoV-2 target nucleic acids are NOT DETECTED. The SARS-CoV-2 RNA is generally detectable in upper respiratoy specimens during the acute phase of infection. The lowest concentration of SARS-CoV-2 viral copies this assay can detect is 131 copies/mL. A negative result does not preclude SARS-Cov-2 infection and should not be used as the sole basis for treatment or other  patient management decisions. A negative result may occur with  improper specimen collection/handling, submission of specimen other than nasopharyngeal swab, presence of viral mutation(s) within the areas targeted by this assay, and inadequate number of viral copies (<131 copies/mL). A negative result must be combined with clinical observations, patient history, and epidemiological information. The expected result is Negative. Fact Sheet for Patients:  https://www.moore.com/ Fact Sheet for Healthcare Providers:  https://www.young.biz/ This test is not yet ap proved or cleared by the [place] FDA and  has been authorized for detection and/or diagnosis of SARS-CoV-2 by FDA under an Emergency Use Authorization (EUA). This EUA will remain  in effect (meaning this test can be used) for the duration of the COVID-19 declaration under Section 564(b)(1) of the Act, 21 U.S.C. section 360bbb-3(b)(1), unless the authorization is terminated or revoked sooner.    Influenza A by PCR NEGATIVE NEGATIVE Final   Influenza B by PCR NEGATIVE NEGATIVE Final    Comment: (NOTE) The Xpert Xpress SARS-CoV-2/FLU/RSV assay is intended as an aid in  the diagnosis of influenza from Nasopharyngeal swab specimens and  should not be used as a sole basis for treatment. Nasal washings and  aspirates are unacceptable for Xpert Xpress SARS-CoV-2/FLU/RSV  testing. Fact Sheet for Patients: https://www.moore.com/ Fact Sheet for Healthcare Providers: https://www.young.biz/ This test is not yet approved or cleared by the [place] FDA and  has been authorized for detection and/or diagnosis of SARS-CoV-2 by  FDA under an Emergency Use Authorization (EUA). This EUA will remain  in effect (meaning this test can be used) for the duration of the  Covid-19 declaration under Section 564(b)(1) of the Act, 21  U.S.C. section 360bbb-3(b)(1), unless  the authorization is  terminated or revoked. Performed at [facility], [address] [place], [place] [address]   Culture, blood (routine x 2)     Status: Abnormal   Collection Time: 10/18/19  2:41 AM   Specimen: BLOOD  Result Value Ref Range Status   Specimen Description   Final    BLOOD RIGHT FOREARM Performed at [facility], [address], [place], [place] [address]    Special Requests   Final    BOTTLES DRAWN AEROBIC AND ANAEROBIC Blood Culture adequate volume Performed at [facility], [address] [place], [place] [address]    Culture  Setup Time   Final    Organism ID to follow GRAM POSITIVE COCCI IN BOTH AEROBIC AND ANAEROBIC BOTTLES CRITICAL RESULT CALLED TO, READ BACK BY AND VERIFIED WITH:  ASAJAH DUNCAN ON 10/18/2019 AT 2051 TIK    Culture (A)  Final    STAPHYLOCOCCUS HAEMOLYTICUS SUSCEPTIBILITIES PERFORMED ON PREVIOUS CULTURE WITHIN  THE LAST 5 DAYS. Performed at [facility], [address] [place], [place] [address]    Report Status 10/21/2019 FINAL  Final  Culture, blood (routine x 2)     Status: Abnormal   Collection Time: 10/18/19  2:41 AM   Specimen: BLOOD  Result Value Ref Range Status   Specimen Description   Final    BLOOD LEFT ANTECUBITAL Performed at [facility], [address] [place], [place] [address]    Special Requests   Final    BOTTLES DRAWN AEROBIC AND ANAEROBIC Blood Culture adequate volume Performed at [facility], [address] [place], [place] [address]    Culture  Setup Time   Final    GRAM POSITIVE COCCI ANAEROBIC BOTTLE ONLY CRITICAL RESULT CALLED TO, READ BACK BY AND VERIFIED WITH: [facility] DUNCAN AT 1742 10/18/19.PMF Performed at [facility], [address] [place], [place] [address]    Culture STAPHYLOCOCCUS HAEMOLYTICUS (A)  Final   Report Status 10/21/2019 FINAL  Final   Organism ID, Bacteria STAPHYLOCOCCUS HAEMOLYTICUS  Final      Susceptibility    Staphylococcus haemolyticus - MIC*    CIPROFLOXACIN <=0.5 SENSITIVE Sensitive     ERYTHROMYCIN >=8 RESISTANT Resistant     GENTAMICIN <=0.5 SENSITIVE Sensitive     OXACILLIN >=4 RESISTANT Resistant     TETRACYCLINE >=16 RESISTANT Resistant     VANCOMYCIN 1 SENSITIVE Sensitive     TRIMETH/SULFA <=10 SENSITIVE Sensitive     CLINDAMYCIN 1 INTERMEDIATE Intermediate     RIFAMPIN <=0.5 SENSITIVE Sensitive     Inducible Clindamycin NEGATIVE Sensitive     * STAPHYLOCOCCUS HAEMOLYTICUS  Blood Culture ID Panel (Reflexed)     Status: Abnormal   Collection Time: 10/18/19  2:41 AM  Result Value Ref Range Status   Enterococcus species NOT DETECTED NOT DETECTED Final   Listeria monocytogenes NOT DETECTED NOT DETECTED Final   Staphylococcus species DETECTED (A) NOT DETECTED Final    Comment: Methicillin (oxacillin) resistant coagulase negative staphylococcus. Possible blood culture contaminant (unless isolated from more than one blood culture draw or clinical case suggests pathogenicity). No antibiotic treatment is indicated for blood  culture contaminants. CRITICAL RESULT CALLED TO, READ BACK BY AND VERIFIED WITH: ASAJAH DUNCAN ON 10/18/2019 AT 2051 TIK    Staphylococcus aureus (BCID) NOT DETECTED NOT DETECTED Final   Methicillin resistance DETECTED (A) NOT DETECTED Final    Comment: CRITICAL RESULT CALLED TO, READ BACK BY AND VERIFIED WITH: ASAJAH DUNCAN ON 10/18/2019 AT 2051 TIK    Streptococcus species NOT DETECTED NOT DETECTED Final   Streptococcus agalactiae NOT DETECTED NOT DETECTED Final   Streptococcus pneumoniae NOT DETECTED NOT DETECTED Final   Streptococcus pyogenes NOT DETECTED NOT DETECTED Final   Acinetobacter baumannii NOT DETECTED NOT DETECTED Final   Enterobacteriaceae species NOT DETECTED NOT DETECTED Final   Enterobacter cloacae complex NOT DETECTED NOT DETECTED Final   Escherichia coli NOT DETECTED NOT DETECTED Final   Klebsiella oxytoca NOT DETECTED NOT DETECTED Final    Klebsiella pneumoniae NOT DETECTED NOT DETECTED Final   Proteus species NOT DETECTED NOT DETECTED Final   Serratia marcescens NOT DETECTED NOT DETECTED Final   Haemophilus influenzae NOT DETECTED NOT DETECTED Final   Neisseria meningitidis NOT DETECTED NOT DETECTED Final   Pseudomonas aeruginosa NOT DETECTED NOT DETECTED Final   Candida albicans NOT DETECTED NOT DETECTED Final   Candida glabrata NOT DETECTED NOT DETECTED Final   Candida krusei NOT DETECTED NOT DETECTED Final  Candida parapsilosis NOT DETECTED NOT DETECTED Final   Candida tropicalis NOT DETECTED NOT DETECTED Final    Comment: Performed at [facility], [address] [place], [place] [address]  MRSA PCR Screening     Status: None   Collection Time: 10/18/19  6:44 PM   Specimen: Nasopharyngeal  Result Value Ref Range Status   MRSA by PCR NEGATIVE NEGATIVE Final    Comment:        The GeneXpert MRSA Assay (FDA approved for NASAL specimens only), is one component of a comprehensive MRSA colonization surveillance program. It is not intended to diagnose MRSA infection nor to guide or monitor treatment for MRSA infections. Performed at [facility], [address] [place], [place] [address]   Culture, blood (Routine X 2) w Reflex to ID Panel     Status: None   Collection Time: 10/20/19  2:58 PM   Specimen: BLOOD  Result Value Ref Range Status   Specimen Description BLOOD BLOOD LEFT HAND  Final   Special Requests   Final    BOTTLES DRAWN AEROBIC AND ANAEROBIC Blood Culture adequate volume   Culture   Final    NO GROWTH 5 DAYS Performed at [facility], [address], [place], [place] [address]    Report Status 10/25/2019 FINAL  Final  Culture, blood (Routine X 2) w Reflex to ID Panel     Status: None   Collection Time: 10/20/19  3:05 PM   Specimen: BLOOD  Result Value Ref Range Status   Specimen Description BLOOD BLOOD RIGHT HAND  Final   Special Requests   Final    BOTTLES  DRAWN AEROBIC AND ANAEROBIC Blood Culture adequate volume   Culture   Final    NO GROWTH 5 DAYS Performed at [facility], [address], [place], [place] [address]    Report Status 10/25/2019 FINAL  Final  Aerobic/Anaerobic Culture (surgical/deep wound)     Status: None (Preliminary result)   Collection Time: 10/24/19 10:37 AM   Specimen: Abdomen; Abscess  Result Value Ref Range Status   Specimen Description   Final    ABDOMEN Performed at [facility], [address] [place], [place] [address]    Special Requests   Final    NONE Performed at [facility], [address] [place], [place] [address]    Gram Stain   Final    FEW WBC PRESENT, PREDOMINANTLY MONONUCLEAR NO ORGANISMS SEEN    Culture   Final    NO GROWTH 2 DAYS Performed at [facility], [address], [place], [place] [address]    Report Status PENDING  Incomplete     Studies: ECHOCARDIOGRAM COMPLETE  Result Date: 10/24/2019    ECHOCARDIOGRAM REPORT   Patient Name:   Sara Chapman [id number] Date of Exam: 10/24/2019 Medical Rec #:  [id number]            Height:       59.0 in Accession #:    [id number]           Weight:       158.5 lb Date of Birth:  Nov 01, 1975            BSA:          1.671 m Patient Age:    43 years             BP:           165/93 mmHg Patient Gender: F  HR:           82 bpm. Exam Location:  [facility] Procedure: 2D Echo, Cardiac Doppler and Color Doppler Indications:     Bacteremia 790.7  History:         Patient has no prior history of Echocardiogram examinations. No                  cardiac history listed.  Sonographer:     Cristela BlueJerry Hege RDCS (AE) Referring Phys:  [id number] DAVID P FITZGERALD Diagnosing Phys: Julien Nordmannimothy Gollan MD IMPRESSIONS  1. Left ventricular ejection fraction, by estimation, is 60 to 65%. The left ventricle has normal function. The left ventricle has no regional wall motion abnormalities. Left ventricular diastolic parameters were normal.  2.  Right ventricular systolic function is normal. The right ventricular size is normal. There is normal pulmonary artery systolic pressure.  3. no valve vegetation noted FINDINGS  Left Ventricle: Left ventricular ejection fraction, by estimation, is 60 to 65%. The left ventricle has normal function. The left ventricle has no regional wall motion abnormalities. The left ventricular internal cavity size was normal in size. There is  no left ventricular hypertrophy. Left ventricular diastolic parameters were normal. Right Ventricle: The right ventricular size is normal. No increase in right ventricular wall thickness. Right ventricular systolic function is normal. There is normal pulmonary artery systolic pressure. The tricuspid regurgitant velocity is 2.36 m/s, and  with an assumed right atrial pressure of 5 mmHg, the estimated right ventricular systolic pressure is 27.3 mmHg. Left Atrium: Left atrial size was normal in size. Right Atrium: Right atrial size was normal in size. Pericardium: There is no evidence of pericardial effusion. Mitral Valve: The mitral valve is normal in structure. Normal mobility of the mitral valve leaflets. Mild mitral valve regurgitation. No evidence of mitral valve stenosis. Tricuspid Valve: The tricuspid valve is normal in structure. Tricuspid valve regurgitation is not demonstrated. No evidence of tricuspid stenosis. Aortic Valve: The aortic valve is normal in structure. Aortic valve regurgitation is not visualized. No aortic stenosis is present. Aortic valve mean gradient measures 1.5 mmHg. Aortic valve peak gradient measures 2.8 mmHg. Aortic valve area, by VTI measures 3.42 cm. Pulmonic Valve: The pulmonic valve was normal in structure. Pulmonic valve regurgitation is not visualized. No evidence of pulmonic stenosis. Aorta: The aortic root is normal in size and structure. Venous: The inferior vena cava is normal in size with greater than 50% respiratory variability, suggesting right  atrial pressure of 3 mmHg. IAS/Shunts: No atrial level shunt detected by color flow Doppler.  LEFT VENTRICLE PLAX 2D LVIDd:         3.87 cm  Diastology LVIDs:         2.31 cm  LV e' lateral:   8.38 cm/s LV PW:         0.86 cm  LV E/e' lateral: 7.3 LV IVS:        0.70 cm  LV e' medial:    4.57 cm/s LVOT diam:     2.00 cm  LV E/e' medial:  13.4 LV SV:         55 LV SV Index:   33 LVOT Area:     3.14 cm  RIGHT VENTRICLE RV Basal diam:  2.87 cm TAPSE (M-mode): 2.6 cm LEFT ATRIUM           Index       RIGHT ATRIUM          Index LA diam:  3.60 cm 2.15 cm/m  RA Area:     6.46 cm LA Vol (A2C): 15.2 ml 9.10 ml/m  RA Volume:   9.94 ml  5.95 ml/m LA Vol (A4C): 28.5 ml 17.06 ml/m  AORTIC VALVE                   PULMONIC VALVE AV Area (Vmax):    3.18 cm    PV Vmax:        0.95 m/s AV Area (Vmean):   2.89 cm    PV Peak grad:   3.6 mmHg AV Area (VTI):     3.42 cm    RVOT Peak grad: 3 mmHg AV Vmax:           83.90 cm/s AV Vmean:          60.600 cm/s AV VTI:            0.160 m AV Peak Grad:      2.8 mmHg AV Mean Grad:      1.5 mmHg LVOT Vmax:         85.00 cm/s LVOT Vmean:        55.700 cm/s LVOT VTI:          0.174 m LVOT/AV VTI ratio: 1.09  AORTA Ao Root diam: 2.60 cm MITRAL VALVE               TRICUSPID VALVE MV Area (PHT): 4.21 cm    TR Peak grad:   22.3 mmHg MV Decel Time: 180 msec    TR Vmax:        236.00 cm/s MV E velocity: 61.10 cm/s MV A velocity: 50.95 cm/s  SHUNTS MV E/A ratio:  1.20        Systemic VTI:  0.17 m                            Systemic Diam: 2.00 cm Julien Nordmann MD Electronically signed by Julien Nordmann MD Signature Date/Time: 10/24/2019/8:25:28 PM    Final     Scheduled Meds: . enoxaparin (LOVENOX) injection  40 mg Subcutaneous Q24H  . feeding supplement  1 Container Oral TID BM  . metroNIDAZOLE  500 mg Oral Q8H  . topiramate  100 mg Oral BID   Continuous Infusions: . sodium chloride Stopped (10/24/19 2007)  . cefTRIAXone (ROCEPHIN)  IV    . dextrose 5 % and 0.45 % NaCl with KCl  20 mEq/L 50 mL/hr at 10/26/19 0844  . vancomycin Stopped (10/25/19 2321)    Assessment/Plan:  1. Acute diverticulitis with abscess, recurrent.  Patient feeling a little bit better and asked if she can go home.  Patient currently on IV Rocephin and p.o. Flagyl.  Decrease rate of IV fluids.  General surgery following.  Add oral pain tramadol to IV pain medication.  As needed nausea medications.  Advance to soft diet yesterday at patient request.  Will confirm with infectious disease tomorrow whether she wants IV or oral antibiotics upon going home. 2. Staph hemolyticus bacteremia.  Infectious disease recommending 7 to 10 days of IV vancomycin 3. Psoriatic arthritis on Cosentyx as outpatient  Code Status:     Code Status Orders  (From admission, onward)         Start     Ordered   10/18/19 0356  Full code  Continuous     10/18/19 0356        Code Status History  Date Active Date Inactive Code Status Order ID Comments User Context   09/16/2019 2045 09/19/2019 1948 Full Code [id number]  Leafy Ro, MD ED   09/03/2019 2108 09/12/2019 1951 Full Code [id number]  Campbell Lerner, MD ED   Advance Care Planning Activity     Family Communication: Spoke with husband on the phone and given update Disposition Plan: Patient asking to go home.  I will confirm with infectious disease on Monday on the plan of IV versus oral antibiotics.  If IV antibiotics recommended then patient will need a PICC line and home health prior to disposition.  Consultants:  General surgery  Interventional radiology  Infectious disease  Procedures:  Interventional radiology aspiration of abscess  Antibiotics:  Rocephin  Flagyl  Vancomycin  Time spent: 26 minutes  [employer]

## 2019-10-26 NOTE — Progress Notes (Signed)
[facility] SURGICAL PROGRESS NOTE (cpt [phone])  Hospital Day(s): 8.   Interval History:  Patient seen and examined no acute events or new complaints overnight.  Appears in better spirits, different sister is here visiting. Patient reports she is feeling better, no longer focused on tenderness from percutaneous drain/aspiration attempt.  Seems highly motivated to go home including resuming oral antibiotics if necessary.  However it may be necessary for course of IV antibiotics considering her prior history of poor tolerance. Encouraging her to take more clear liquid diet in order to go home.  Awaiting culture and sensitivity of aspirate.  Will explore options of home IV antibiotics, will repeat CT scan tomorrow.  Review of Systems:  Constitutional: denies fever, chills  HEENT: denies cough or congestion  Respiratory: denies any shortness of breath  Cardiovascular: denies chest pain or palpitations  Gastrointestinal: + abdominal pain (clinically improving), denied V, or diarrhea/and bowel function as per interval history Genitourinary: denies burning with urination or urinary frequency   Vital signs in last 24 hours: [min-max] current  Temp:  [98.2 F (36.8 C)-98.6 F (37 C)] 98.2 F (36.8 C) (03/28 0621) Pulse Rate:  [75-109] 83 (03/28 0904) Resp:  [16-18] 18 (03/28 0904) BP: (139-181)/(80-107) 156/96 (03/28 0904) SpO2:  [95 %-100 %] 97 % (03/28 0621)     Height: 4\' 11"  (149.9 cm) Weight: 71.9 kg BMI (Calculated): 32   Intake/Output last 2 shifts:  03/27 0701 - 03/28 0700 In: 1800.2 [P.O.:120; I.V.:1480.2; IV Piggyback:200] Out: -    Physical Exam:  Constitutional: alert, cooperative and no distress  HENT: normocephalic without obvious abnormality  Eyes: PERRL, EOM's grossly intact and symmetric  Respiratory: breathing non-labored at rest  Cardiovascular: regular rate and sinus rhythm  Gastrointestinal: Soft, non-distended, no rebound/guarding, clearly not  peritonitic Musculoskeletal: no edema or wounds, motor and sensation grossly intact, NT    Labs:  CBC Latest Ref Rng & Units 10/26/2019 10/24/2019 10/22/2019  WBC 4.0 - 10.5 K/uL 6.3 5.9 4.1  Hemoglobin 12.0 - 15.0 g/dL 11.4(L) 10.8(L) 9.0(L)  Hematocrit 36.0 - 46.0 % 34.5(L) 32.7(L) 26.8(L)  Platelets 150 - 400 K/uL 390 335 219   CMP Latest Ref Rng & Units 10/26/2019 10/24/2019 10/23/2019  Glucose 70 - 99 mg/dL 97 95 111(H)  BUN 6 - 20 mg/dL 5(L) 5(L) <5(L)  Creatinine 0.44 - 1.00 mg/dL 1.25(H) 1.32(H) 1.27(H)  Sodium 135 - 145 mmol/L 139 141 141  Potassium 3.5 - 5.1 mmol/L 4.0 3.7 3.0(L)  Chloride 98 - 111 mmol/L 115(H) 113(H) 109  CO2 22 - 32 mmol/L 15(L) 20(L) 24  Calcium 8.9 - 10.3 mg/dL 8.5(L) 8.2(L) 8.5(L)  Total Protein 6.5 - 8.1 g/dL 6.1(L) - -  Total Bilirubin 0.3 - 1.2 mg/dL 0.5 - -  Alkaline Phos 38 - 126 U/L 99 - -  AST 15 - 41 U/L 14(L) - -  ALT 0 - 44 U/L 19 - -     Imaging studies: No new pertinent imaging studies   Assessment/Plan: (ICD-10's: K17.92) 44 y.o. female with persistent, but somewhat improvement, abdominal pain attributable to recurrent -vs- unresolved diverticulitis with resolution in extraluminal air although now with intra-abdominal abscess.    - CLD  --> Her PO intake has been minimal.   - Continue IV Abx Vanc and Unasyn; ID also involved, consideration of outpatient IV antibiotics  - Pain control prn; antiemetics prn             - serial abdominal examination             -  I do not think she warrants any emergent surgical intervention; we had discussed the possibility of minimally invasive surgical intervention this admission, but at this point her and her family would like to continue conservative management   - Mobilization encouraged              - Further management per primary team             - DVT prophylaxis  Repeat CT abdomen pelvis in a.m.   -- Campbell Lerner, PA-C [facility] 10/26/2019, 9:58 AM [phone] M-F:  7am - 4pm

## 2019-10-27 ENCOUNTER — Inpatient Hospital Stay: Payer: BC Managed Care – PPO

## 2019-10-27 ENCOUNTER — Encounter: Payer: Self-pay | Admitting: Internal Medicine

## 2019-10-27 ENCOUNTER — Inpatient Hospital Stay: Payer: Self-pay

## 2019-10-27 DIAGNOSIS — I10 Essential (primary) hypertension: Secondary | ICD-10-CM

## 2019-10-27 DIAGNOSIS — G8929 Other chronic pain: Secondary | ICD-10-CM

## 2019-10-27 DIAGNOSIS — R519 Headache, unspecified: Secondary | ICD-10-CM

## 2019-10-27 MED ORDER — IOHEXOL 300 MG/ML  SOLN
100.0000 mL | Freq: Once | INTRAMUSCULAR | Status: AC | PRN
  Administered 2019-10-27: 07:00:00 100 mL via INTRAVENOUS

## 2019-10-27 MED ORDER — SODIUM CHLORIDE 0.9% FLUSH: 10.0000 mL | INTRAVENOUS | Status: DC | PRN

## 2019-10-27 MED ORDER — CHLORHEXIDINE GLUCONATE CLOTH 2 % EX PADS: 6.0000 | MEDICATED_PAD | Freq: Every day | CUTANEOUS | Status: DC

## 2019-10-27 MED ORDER — SODIUM CHLORIDE 0.9% FLUSH: 20.0000 mL | Freq: Two times a day (BID) | INTRAVENOUS | Status: DC

## 2019-10-27 MED ORDER — TRAMADOL HCL 50 MG PO TABS: 50.0000 mg | ORAL_TABLET | Freq: Three times a day (TID) | ORAL | 0 refills | Status: DC | PRN

## 2019-10-27 MED ORDER — CEFTRIAXONE IV (FOR PTA / DISCHARGE USE ONLY): 2.0000 g | INTRAVENOUS | 0 refills | Status: AC

## 2019-10-27 MED ORDER — AMLODIPINE BESYLATE 5 MG PO TABS
5.0000 mg | ORAL_TABLET | Freq: Every day | ORAL | Status: DC
  Administered 2019-10-27: 5 mg via ORAL
  Filled 2019-10-27: qty 1

## 2019-10-27 MED ORDER — SODIUM CHLORIDE 0.9% FLUSH: 10.0000 mL | Freq: Two times a day (BID) | INTRAVENOUS | Status: DC

## 2019-10-27 MED ORDER — IOHEXOL 9 MG/ML PO SOLN: 500.0000 mL | ORAL | Status: AC

## 2019-10-27 MED ORDER — AMLODIPINE BESYLATE 5 MG PO TABS: 5.0000 mg | ORAL_TABLET | Freq: Every day | ORAL | 0 refills | Status: DC

## 2019-10-27 MED ORDER — METRONIDAZOLE 500 MG PO TABS: 500.0000 mg | ORAL_TABLET | Freq: Three times a day (TID) | ORAL | 0 refills | Status: AC

## 2019-10-27 MED ORDER — ONDANSETRON 4 MG PO TBDP: 4.0000 mg | ORAL_TABLET | Freq: Four times a day (QID) | ORAL | 0 refills | Status: DC | PRN

## 2019-10-27 MED ORDER — SODIUM CHLORIDE 0.9% FLUSH: INTRAVENOUS | 0 refills | Status: DC

## 2019-10-27 MED ORDER — TOPIRAMATE 50 MG PO TABS: 50.0000 mg | ORAL_TABLET | Freq: Two times a day (BID) | ORAL | 0 refills | Status: DC

## 2019-10-27 NOTE — Progress Notes (Signed)
PHARMACY CONSULT NOTE FOR:  OUTPATIENT  PARENTERAL ANTIBIOTIC THERAPY (OPAT)  Indication: Complicated diverticulitis Regimen: Ceftriaxone 2 grams IV every 24 hours; PO flagyl 500mg  every 8 hours End date: 11/09/19  IV antibiotic discharge orders are pended. To discharging provider:  please sign these orders via discharge navigator,  Select New Orders & click on the button choice - Manage This Unsigned Work.     Thank you for allowing pharmacy to be a part of this patient's care.  01/09/20 10/27/2019, 10:19 AM

## 2019-10-27 NOTE — Treatment Plan (Addendum)
Diagnosis: Complicated diverticulitis Baseline Creatinine 1.2    Allergies  Allergen Reactions  . Clonazepam Other (See Comments)    SUICIDAL THOUGHTS SUICIDAL THOUGHTS SUICIDAL THOUGHTS   . Ibuprofen Other (See Comments)    stomach ulcers stomach ulcers stomach ulcers stomach ulcers   . Nsaids Other (See Comments)    GI ulcers  . Pseudoephedrine Other (See Comments)    palpitations palpitations   . Tolmetin Other (See Comments)    GI ulcers GI ulcers   . Pseudoephedrine Hcl Other (See Comments) and Palpitations    Unable to tolerate Unable to tolerate   . Tape Rash    Paper tape breaks down the skin and causes sores    OPAT Orders Discharge antibiotics: Ceftriaxone 2 grams IV every 24 hours Duration:2 weeks PO flagyl 500mg  every 8 hours  End Date: 11/09/19  [facility] Care Per Protocol:  Labs weekly while on IV antibiotics: X__ CBC with differential  _X_ CMP  _X_ Please pull PIC at completion of IV antibiotics  Fax weekly labs to Dr.Dashon Mcintire [phone]  Clinic Follow Up Appt:in 10 days   Call [phone] to make appt

## 2019-10-27 NOTE — Progress Notes (Signed)
[facility] SURGICAL PROGRESS NOTE (cpt [phone])  Hospital Day(s): 9.   Interval History: Patient seen and examined, no acute events or new complaints overnight. Patient reports she is feeling better. Her abdominal pain has improved and nearly resolved. No fever, chills, nausea, or emesis. Her PO intake has increased and she is on a regular diet. She is very anxious to get home today and notes that "no matter what happens, Im busting out of here." Plan for ID evaluation for possible IV Abx outpatient.   Review of Systems:  Constitutional: denies fever, chills  HEENT: denies cough or congestion  Respiratory: denies any shortness of breath  Cardiovascular: denies chest pain or palpitations  Gastrointestinal: + abdominal pain (improved), denied N/V, or diarrhea/and bowel function as per interval history Genitourinary: denies burning with urination or urinary frequency   Vital signs in last 24 hours: [min-max] current  Temp:  [98.1 F (36.7 C)-98.8 F (37.1 C)] 98.1 F (36.7 C) (03/29 0711) Pulse Rate:  [79-93] 79 (03/29 0711) Resp:  [16-20] 16 (03/29 0711) BP: (151-167)/(83-108) 161/96 (03/29 0711) SpO2:  [97 %-100 %] 98 % (03/29 0711)     Height: 4\' 11"  (149.9 cm) Weight: 71.9 kg BMI (Calculated): 32   Intake/Output last 2 shifts:  03/28 0701 - 03/29 0700 In: 2002.7 [P.O.:120; I.V.:1335.3; IV Piggyback:547.4] Out: -    Physical Exam:  Constitutional: alert, cooperative and no distress  HENT: normocephalic without obvious abnormality  Eyes: PERRL, EOM's grossly intact and symmetric  Respiratory: breathing non-labored at rest  Cardiovascular: regular rate and sinus rhythm  Gastrointestinal: Soft, non-tender, and non-distended, no rebound/guarding Musculoskeletal: no edema or wounds, motor and sensation grossly intact, NT    Labs:  CBC Latest Ref Rng & Units 10/26/2019 10/24/2019 10/22/2019  WBC 4.0 - 10.5 K/uL 6.3 5.9 4.1  Hemoglobin 12.0 - 15.0 g/dL 11.4(L)  10.8(L) 9.0(L)  Hematocrit 36.0 - 46.0 % 34.5(L) 32.7(L) 26.8(L)  Platelets 150 - 400 K/uL 390 335 219   CMP Latest Ref Rng & Units 10/26/2019 10/24/2019 10/23/2019  Glucose 70 - 99 mg/dL 97 95 111(H)  BUN 6 - 20 mg/dL 5(L) 5(L) <5(L)  Creatinine 0.44 - 1.00 mg/dL 1.25(H) 1.32(H) 1.27(H)  Sodium 135 - 145 mmol/L 139 141 141  Potassium 3.5 - 5.1 mmol/L 4.0 3.7 3.0(L)  Chloride 98 - 111 mmol/L 115(H) 113(H) 109  CO2 22 - 32 mmol/L 15(L) 20(L) 24  Calcium 8.9 - 10.3 mg/dL 8.5(L) 8.2(L) 8.5(L)  Total Protein 6.5 - 8.1 g/dL 6.1(L) - -  Total Bilirubin 0.3 - 1.2 mg/dL 0.5 - -  Alkaline Phos 38 - 126 U/L 99 - -  AST 15 - 41 U/L 14(L) - -  ALT 0 - 44 U/L 19 - -     Imaging studies: No new pertinent imaging studies   Assessment/Plan: (ICD-10's: K37.92) 44 y.o. female with clinically improved recurrent-vs- unresolveddiverticulitis with resolution in extraluminal air although now with intra-abdominal abscess   - Okay to continue regular/soft diet as tolerates  - Reviewed ID plan for home ABx; 2g rocephin IV q24 + 500mg  Flagyl q8 x2 weeks  - Place PICC today   - pain control prn  - monitor abdominal examiantion  - further management per primary team    - Discharge Planning: If PICC is placed and home ABx arrange then it is okay for discharge from surgery standpoint, continue ABx per ID recommendations, pain control prn, follow up with general surgery in ~2 weeks   All of the above  findings and recommendations were discussed with the patient, and the medical team, and all of patient's questions were answered to her expressed satisfaction.  -- Lynden Oxford, PA-C [facility] 10/27/2019, 9:18 AM [phone] M-F: 7am - 4pm

## 2019-10-27 NOTE — Progress Notes (Signed)
Pharmacy Antibiotic Note  Sara Chapman is a 44 y.o. female admitted on 10/18/2019 with bacteremia. Patient with recent history of complicated diverticulitis which was treated with antibiotics and abscess drainage. Pharmacy has been consulted for Vancomycin dosing. Patient is also on ceftriaxone and Flagyl. ID consulted.  Plan: Continue Vancomycin 750 mg IV Q 24 hrs. Goal AUC 400-550. Expected AUC: 472.9 Baseline on 2 level kinetics:  Peak (03/25 @ 0413) 51 mcg/mL Random (03/25 @ 2156) 15 mcg/mL  Will order SCr for AM to continue to monitor renal function.  SCr 1.27 (3/25) >1.32 (3/26) >>1.25 (3/28)   Will need to follow up length of therapy vs checking levels.    Height: 4\' 11"  (149.9 cm) Weight: 158 lb 8.2 oz (71.9 kg) IBW/kg (Calculated) : 43.2  Temp (24hrs), Avg:98.4 F (36.9 C), Min:98.1 F (36.7 C), Max:98.8 F (37.1 C)  Recent Labs  Lab 10/21/19 0432 10/22/19 0440 10/23/19 0413 10/23/19 2156 10/24/19 0323 10/26/19 0619  WBC 4.8 4.1  --   --  5.9 6.3  CREATININE 1.16* 1.18* 1.27*  --  1.32* 1.25*  VANCOTROUGH  --   --   --  15  --   --   VANCOPEAK  --   --  51*  --   --   --     Estimated Creatinine Clearance: 49.1 mL/min (A) (by C-G formula based on SCr of 1.25 mg/dL (H)).    Allergies  Allergen Reactions  . Clonazepam Other (See Comments)    SUICIDAL THOUGHTS SUICIDAL THOUGHTS SUICIDAL THOUGHTS   . Ibuprofen Other (See Comments)    stomach ulcers stomach ulcers stomach ulcers stomach ulcers   . Nsaids Other (See Comments)    GI ulcers  . Pseudoephedrine Other (See Comments)    palpitations palpitations   . Tolmetin Other (See Comments)    GI ulcers GI ulcers   . Pseudoephedrine Hcl Other (See Comments) and Palpitations    Unable to tolerate Unable to tolerate   . Tape Rash    Paper tape breaks down the skin and causes sores    Antimicrobials this admission: Pip/tazo 3/20 >> 3/26 Vancomycin 3/21 >>  Doxycycline 3/22  >>3/25 Unasyn 3/26>>3/28 CTX 3/28 >> Flagyl 3/28 >> Vancomycin 3/21 >>  Dose adjustments this admission:   Microbiology results: 3/22 BCx: No growth final 3/20 BCx: 3/4 bottles Staphylococcus haemolyticus 3/20 MRSA PCR: negative  Thank you for allowing pharmacy to be a part of this patient's care.  4/20, PharmD, BCPS Clinical Pharmacist 10/27/2019 10:46 AM

## 2019-10-27 NOTE — Progress Notes (Signed)
Peripherally Inserted Central Catheter Placement  The IV Nurse has discussed with the patient and/or persons authorized to consent for the patient, the purpose of this procedure and the potential benefits and risks involved with this procedure.  The benefits include less needle sticks, lab draws from the catheter, and the patient may be discharged home with the catheter. Risks include, but not limited to, infection, bleeding, blood clot (thrombus formation), and puncture of an artery; nerve damage and irregular heartbeat and possibility to perform a PICC exchange if needed/ordered by physician.  Alternatives to this procedure were also discussed.  Bard Power PICC patient education guide, fact sheet on infection prevention and patient information card has been provided to patient /or left at bedside.    PICC Placement Documentation  PICC Single Lumen 10/27/19 PICC Right Basilic 33 cm 0 cm (Active)  Indication for Insertion or Continuance of Line Limited venous access - need for IV therapy >5 days (PICC only) 10/27/19 1126  Exposed Catheter (cm) 0 cm 10/27/19 1126  Site Assessment Clean;Dry;Intact 10/27/19 1126  Line Status Flushed;Blood return noted 10/27/19 1126  Dressing Type Transparent 10/27/19 1126  Dressing Status Clean;Dry;Intact;Antimicrobial disc in place;Other (Comment) 10/27/19 1126  Dressing Intervention New dressing 10/27/19 1126  Dressing Change Due 11/03/19 10/27/19 1126       Reginia Forts Albarece 10/27/2019, 11:27 AM

## 2019-10-27 NOTE — Discharge Summary (Signed)
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 Bowel perforation ([place])   . Psoriatic arthritis ([place])   . Stomach ulcer     HOSPITAL COURSE:   1.  Acute diverticulitis with abscess, recurrent.  The patient is asking if she can go home.  Case discussed with infectious disease specialist and she is going to continue IV Rocephin through the PICC line placed today.  The last day of therapy is going to be 11/09/2019.  Home health set up and PICC line care as per home health.  The patient will also be on Flagyl orally to complete the course.  Patient will be given a few pills of oral tramadol for pain and Zofran for nausea.  Follow-up with general surgery as outpatient.  Patient had a repeat CAT scan today that did not show any substantial interval change in the tiny abscess in the pericolic fat adjacent to the proximal sigmoid colon.  The elongated tubular small bowel mesenteric abscess seen on the previous study has decreased in the interval.  Patient still has some edema and inflammation of the left omentum.  Close clinical follow-up as outpatient needed.  Follow-up with infectious disease as outpatient. 2.  Staph hemolyticus bacteremia.  Patient received 8 days of IV vancomycin here and infectious disease  discontinued this medication. 3.  Psoriatic arthritis.  Hold Cosentyx while on antibiotics. 4.  Headaches.  Decreased dose of Topamax 5.  Blood pressure variable with patient's pain and mood.  We will start Norvasc 5 mg daily.  DISCHARGE CONDITIONS:   Fair  CONSULTS OBTAINED:  Treatment Team:  Fredirick Maudlin, MD  DRUG ALLERGIES:   Allergies  Allergen Reactions  . Clonazepam Other (See Comments)    SUICIDAL THOUGHTS SUICIDAL THOUGHTS SUICIDAL THOUGHTS   . Ibuprofen Other (See Comments)    stomach ulcers stomach ulcers stomach ulcers stomach ulcers   . Nsaids Other (See Comments)    GI ulcers  . Pseudoephedrine Other (See Comments)    palpitations palpitations   . Tolmetin Other (See Comments)    GI ulcers GI ulcers   . Pseudoephedrine Hcl Other (See Comments) and Palpitations    Unable to tolerate Unable to tolerate   . Tape Rash    Paper tape breaks down the skin and causes sores    DISCHARGE MEDICATIONS:   Allergies as of 10/27/2019      Reactions   Clonazepam Other (See Comments)   SUICIDAL THOUGHTS SUICIDAL THOUGHTS SUICIDAL THOUGHTS   Ibuprofen Other (See Comments)   stomach ulcers stomach ulcers stomach ulcers stomach ulcers   Nsaids Other (See Comments)   GI ulcers   Pseudoephedrine Other (See Comments)   palpitations palpitations   Tolmetin Other (See Comments)   GI ulcers GI ulcers   Pseudoephedrine Hcl Other (See Comments), Palpitations   Unable to tolerate Unable to tolerate   Tape  Rash   Paper tape breaks down the skin and causes sores      Medication List    STOP taking these medications   Cosentyx Sensoready (300 MG) 150 MG/ML Soaj Generic drug: Secukinumab (300 MG Dose)   oxyCODONE 5 MG immediate release tablet Commonly known as: Oxy IR/ROXICODONE     TAKE these medications   amLODipine 5 MG tablet Commonly known as: NORVASC Take 1 tablet (5 mg total) by mouth daily.   cefTRIAXone  IVPB Commonly known as:  ROCEPHIN Inject 2 g into the vein daily for 13 days. Indication:  Complicated diverticulitis Last Day of Therapy:  11/09/2019 Labs - Once weekly:  CBC/D and CMP   metroNIDAZOLE 500 MG tablet Commonly known as: Flagyl Take 1 tablet (500 mg total) by mouth 3 (three) times daily for 14 days.   omeprazole 40 MG capsule Commonly known as: PRILOSEC Take 40 mg by mouth 2 (two) times daily.   ondansetron 4 MG disintegrating tablet Commonly known as: ZOFRAN-ODT Take 1 tablet (4 mg total) by mouth every 6 (six) hours as needed for nausea.   sodium chloride flush 0.9 % Soln Commonly known as: NS 20ml before and after antibiotic infusion   topiramate 50 MG tablet Commonly known as: TOPAMAX Take 1 tablet (50 mg total) by mouth 2 (two) times daily. What changed:   medication strength  how much to take   traMADol 50 MG tablet Commonly known as: ULTRAM Take 1 tablet (50 mg total) by mouth every 8 (eight) hours as needed for severe pain.            Home Infusion Instuctions  (From admission, onward)         Start     Ordered   10/27/19 0000  Home infusion instructions    Question:  Instructions  Answer:  Flushing of vascular access device: 0.9% NaCl pre/post medication administration and prn patency; Heparin 100 u/ml, 5ml for implanted ports and Heparin 10u/ml, 5ml for all other central venous catheters.   10/27/19 1037           DISCHARGE INSTRUCTIONS:   Follow-up PMD 5 days Follow-up infectious disease 10 days Follow-up general surgery 1 week  If you experience worsening of your admission symptoms, develop shortness of breath, life threatening emergency, suicidal or homicidal thoughts you must seek medical attention immediately by calling 911 or calling your MD immediately  if symptoms less severe.  You Must read complete instructions/literature along with all the possible adverse reactions/side effects for all the Medicines you take and that have been prescribed to you.  Take any new Medicines after you have completely understood and accept all the possible adverse reactions/side effects.   Please note  You were cared for by a hospitalist during your hospital stay. If you have any questions about your discharge medications or the care you received while you were in the hospital after you are discharged, you can call the unit and asked to speak with the hospitalist on call if the hospitalist that took care of you is not available. Once you are discharged, your primary care physician will handle any further medical issues. Please note that NO REFILLS for any discharge medications will be authorized once you are discharged, as it is imperative that you return to your primary care physician (or establish a relationship with a primary care physician if you do not have one) for your aftercare needs so that they can reassess your need for medications and monitor your  lab values.    Today   CHIEF COMPLAINT:   Chief Complaint  Patient presents with  . Abdominal Pain    HISTORY OF PRESENT ILLNESS:  Sara Chapman  is a 44 y.o. female coming in with abdominal pain.   VITAL SIGNS:  Blood pressure (!) 178/107, pulse 79, temperature 98.3 F (36.8 C), temperature source Oral, resp. rate 16, height 4\' 11"  (1.499 m), weight 71.9 kg, last menstrual period 10/15/2019, SpO2 99 %.  I/O:    Intake/Output Summary (Last 24 hours) at 10/27/2019 1322 Last data filed at 10/26/2019 1548 Gross per 24 hour  Intake 2002.68 ml  Output --  Net 2002.68 ml    PHYSICAL EXAMINATION:  GENERAL:  44 y.o.-year-old patient lying in the bed with no acute distress.  EYES: Pupils equal, round, reactive to light and accommodation. No scleral icterus. HEENT: Head atraumatic, normocephalic.  NECK:  Supple, no jugular venous distention. No thyroid enlargement, no tenderness.  LUNGS: Normal breath sounds bilaterally, no wheezing, rales,rhonchi or crepitation. No use of accessory muscles of  respiration.  CARDIOVASCULAR: S1, S2 normal. No murmurs, rubs, or gallops.  ABDOMEN: Soft, tender, non-distended. Bowel sounds present. EXTREMITIES: No pedal edema, cyanosis, or clubbing.  NEUROLOGIC: Cranial nerves II through XII are intact. Muscle strength 5/5 in all extremities. Sensation intact. Gait not checked.  PSYCHIATRIC: The patient is alert and oriented x 3.  SKIN: No obvious rash, lesion, or ulcer.   DATA REVIEW:   CBC Recent Labs  Lab 10/26/19 0619  WBC 6.3  HGB 11.4*  HCT 34.5*  PLT 390    Chemistries  Recent Labs  Lab 10/23/19 0413 10/24/19 0323 10/26/19 0619  NA 141   < > 139  K 3.0*   < > 4.0  CL 109   < > 115*  CO2 24   < > 15*  GLUCOSE 111*   < > 97  BUN <5*   < > 5*  CREATININE 1.27*   < > 1.25*  CALCIUM 8.5*   < > 8.5*  MG 1.5*  --   --   AST  --   --  14*  ALT  --   --  19  ALKPHOS  --   --  99  BILITOT  --   --  0.5   < > = values in this interval not displayed.    Microbiology Results  Results for orders placed or performed during the hospital encounter of 10/18/19  Respiratory Panel by RT PCR (Flu A&B, Covid) -     Status: None   Collection Time: 10/18/19  2:41 AM  Result Value Ref Range Status   SARS Coronavirus 2 by RT PCR NEGATIVE NEGATIVE Final    Comment: (NOTE) SARS-CoV-2 target nucleic acids are NOT DETECTED. The SARS-CoV-2 RNA is generally detectable in upper respiratoy specimens during the acute phase of infection. The lowest concentration of SARS-CoV-2 viral copies this assay can detect is 131 copies/mL. A negative result does not preclude SARS-Cov-2 infection and should not be used as the sole basis for treatment or other patient management decisions. A negative result may occur with  improper specimen collection/handling, submission of specimen other than nasopharyngeal swab, presence of viral mutation(s) within the areas targeted by this assay, and inadequate number of viral copies (<131 copies/mL). A negative result  must be combined with clinical observations, patient history, and epidemiological information. The expected result is Negative. Fact Sheet for Patients:  10/20/19 Fact Sheet for Healthcare Providers:  https://www.moore.com/ This  test is not yet ap proved or cleared by the [place] FDA and  has been authorized for detection and/or diagnosis of SARS-CoV-2 by FDA under an Emergency Use Authorization (EUA). This EUA will remain  in effect (meaning this test can be used) for the duration of the COVID-19 declaration under Section 564(b)(1) of the Act, 21 U.S.C. section 360bbb-3(b)(1), unless the authorization is terminated or revoked sooner.    Influenza A by PCR NEGATIVE NEGATIVE Final   Influenza B by PCR NEGATIVE NEGATIVE Final    Comment: (NOTE) The Xpert Xpress SARS-CoV-2/FLU/RSV assay is intended as an aid in  the diagnosis of influenza from Nasopharyngeal swab specimens and  should not be used as a sole basis for treatment. Nasal washings and  aspirates are unacceptable for Xpert Xpress SARS-CoV-2/FLU/RSV  testing. Fact Sheet for Patients: https://www.moore.com/https://www.fda.gov/media/142436/download Fact Sheet for Healthcare Providers: https://www.young.biz/https://www.fda.gov/media/142435/download This test is not yet approved or cleared by the [place] FDA and  has been authorized for detection and/or diagnosis of SARS-CoV-2 by  FDA under an Emergency Use Authorization (EUA). This EUA will remain  in effect (meaning this test can be used) for the duration of the  Covid-19 declaration under Section 564(b)(1) of the Act, 21  U.S.C. section 360bbb-3(b)(1), unless the authorization is  terminated or revoked. Performed at [facility], [address] [place], [place] [address]   Culture, blood (routine x 2)     Status: Abnormal   Collection Time: 10/18/19  2:41 AM   Specimen: BLOOD  Result Value Ref Range Status   Specimen Description   Final     BLOOD RIGHT FOREARM Performed at [facility], [address] [place], [place] [address]    Special Requests   Final    BOTTLES DRAWN AEROBIC AND ANAEROBIC Blood Culture adequate volume Performed at [facility], [address] [place], [place] [address]    Culture  Setup Time   Final    Organism ID to follow GRAM POSITIVE COCCI IN BOTH AEROBIC AND ANAEROBIC BOTTLES CRITICAL RESULT CALLED TO, READ BACK BY AND VERIFIED WITH:  ASAJAH DUNCAN ON 10/18/2019 AT 2051 TIK    Culture (A)  Final    STAPHYLOCOCCUS HAEMOLYTICUS SUSCEPTIBILITIES PERFORMED ON PREVIOUS CULTURE WITHIN THE LAST 5 DAYS. Performed at [facility], [address] [place], [place] [address]    Report Status 10/21/2019 FINAL  Final  Culture, blood (routine x 2)     Status: Abnormal   Collection Time: 10/18/19  2:41 AM   Specimen: BLOOD  Result Value Ref Range Status   Specimen Description   Final    BLOOD LEFT ANTECUBITAL Performed at [facility], [address] [place], [place] [address]    Special Requests   Final    BOTTLES DRAWN AEROBIC AND ANAEROBIC Blood Culture adequate volume Performed at [facility], [address] [place], [place] [address]    Culture  Setup Time   Final    GRAM POSITIVE COCCI ANAEROBIC BOTTLE ONLY CRITICAL RESULT CALLED TO, READ BACK BY AND VERIFIED WITH: [facility] LLC[facility]JH DUNCAN AT 1742 10/18/19.PMF Performed at [facility], [address] [place], [place] [address]    Culture STAPHYLOCOCCUS HAEMOLYTICUS (A)  Final   Report Status 10/21/2019 FINAL  Final   Organism ID, Bacteria STAPHYLOCOCCUS HAEMOLYTICUS  Final      Susceptibility   Staphylococcus haemolyticus - MIC*    CIPROFLOXACIN <=0.5 SENSITIVE Sensitive     ERYTHROMYCIN >=8 RESISTANT Resistant  GENTAMICIN <=0.5 SENSITIVE Sensitive     OXACILLIN >=4 RESISTANT Resistant     TETRACYCLINE >=16 RESISTANT Resistant     VANCOMYCIN 1 SENSITIVE Sensitive     TRIMETH/SULFA  <=10 SENSITIVE Sensitive     CLINDAMYCIN 1 INTERMEDIATE Intermediate     RIFAMPIN <=0.5 SENSITIVE Sensitive     Inducible Clindamycin NEGATIVE Sensitive     * STAPHYLOCOCCUS HAEMOLYTICUS  Blood Culture ID Panel (Reflexed)     Status: Abnormal   Collection Time: 10/18/19  2:41 AM  Result Value Ref Range Status   Enterococcus species NOT DETECTED NOT DETECTED Final   Listeria monocytogenes NOT DETECTED NOT DETECTED Final   Staphylococcus species DETECTED (A) NOT DETECTED Final    Comment: Methicillin (oxacillin) resistant coagulase negative staphylococcus. Possible blood culture contaminant (unless isolated from more than one blood culture draw or clinical case suggests pathogenicity). No antibiotic treatment is indicated for blood  culture contaminants. CRITICAL RESULT CALLED TO, READ BACK BY AND VERIFIED WITH: ASAJAH DUNCAN ON 10/18/2019 AT 2051 TIK    Staphylococcus aureus (BCID) NOT DETECTED NOT DETECTED Final   Methicillin resistance DETECTED (A) NOT DETECTED Final    Comment: CRITICAL RESULT CALLED TO, READ BACK BY AND VERIFIED WITH: ASAJAH DUNCAN ON 10/18/2019 AT 2051 TIK    Streptococcus species NOT DETECTED NOT DETECTED Final   Streptococcus agalactiae NOT DETECTED NOT DETECTED Final   Streptococcus pneumoniae NOT DETECTED NOT DETECTED Final   Streptococcus pyogenes NOT DETECTED NOT DETECTED Final   Acinetobacter baumannii NOT DETECTED NOT DETECTED Final   Enterobacteriaceae species NOT DETECTED NOT DETECTED Final   Enterobacter cloacae complex NOT DETECTED NOT DETECTED Final   Escherichia coli NOT DETECTED NOT DETECTED Final   Klebsiella oxytoca NOT DETECTED NOT DETECTED Final   Klebsiella pneumoniae NOT DETECTED NOT DETECTED Final   Proteus species NOT DETECTED NOT DETECTED Final   Serratia marcescens NOT DETECTED NOT DETECTED Final   Haemophilus influenzae NOT DETECTED NOT DETECTED Final   Neisseria meningitidis NOT DETECTED NOT DETECTED Final   Pseudomonas aeruginosa NOT  DETECTED NOT DETECTED Final   Candida albicans NOT DETECTED NOT DETECTED Final   Candida glabrata NOT DETECTED NOT DETECTED Final   Candida krusei NOT DETECTED NOT DETECTED Final   Candida parapsilosis NOT DETECTED NOT DETECTED Final   Candida tropicalis NOT DETECTED NOT DETECTED Final    Comment: Performed at [facility], [address] [place], [place] [address]  MRSA PCR Screening     Status: None   Collection Time: 10/18/19  6:44 PM   Specimen: Nasopharyngeal  Result Value Ref Range Status   MRSA by PCR NEGATIVE NEGATIVE Final    Comment:        The GeneXpert MRSA Assay (FDA approved for NASAL specimens only), is one component of a comprehensive MRSA colonization surveillance program. It is not intended to diagnose MRSA infection nor to guide or monitor treatment for MRSA infections. Performed at [facility], [address] [place], [place] [address]   Culture, blood (Routine X 2) w Reflex to ID Panel     Status: None   Collection Time: 10/20/19  2:58 PM   Specimen: BLOOD  Result Value Ref Range Status   Specimen Description BLOOD BLOOD LEFT HAND  Final   Special Requests   Final    BOTTLES DRAWN AEROBIC AND ANAEROBIC Blood Culture adequate volume   Culture   Final    NO GROWTH 5 DAYS Performed at [facility], [address] [place],  [place] [address]    Report Status 10/25/2019 FINAL  Final  Culture, blood (Routine X 2) w Reflex to ID Panel     Status: None   Collection Time: 10/20/19  3:05 PM   Specimen: BLOOD  Result Value Ref Range Status   Specimen Description BLOOD BLOOD RIGHT HAND  Final   Special Requests   Final    BOTTLES DRAWN AEROBIC AND ANAEROBIC Blood Culture adequate volume   Culture   Final    NO GROWTH 5 DAYS Performed at [facility], [address] [place], [place] [address]    Report Status 10/25/2019 FINAL  Final  Aerobic/Anaerobic Culture (surgical/deep wound)     Status: None (Preliminary result)    Collection Time: 10/24/19 10:37 AM   Specimen: Abdomen; Abscess  Result Value Ref Range Status   Specimen Description   Final    ABDOMEN Performed at [facility], [address] [place], [place] [address]    Special Requests   Final    NONE Performed at [facility], [address] [place], [place] [address]    Gram Stain   Final    FEW WBC PRESENT, PREDOMINANTLY MONONUCLEAR NO ORGANISMS SEEN    Culture   Final    NO GROWTH 3 DAYS NO ANAEROBES ISOLATED; CULTURE IN PROGRESS FOR 5 DAYS Performed at [facility], [address], [place], [place] [address]    Report Status PENDING  Incomplete    RADIOLOGY:  CT ABDOMEN PELVIS W CONTRAST  Result Date: 10/27/2019 CLINICAL DATA:  Diverticulitis with abscess. EXAM: CT ABDOMEN AND PELVIS WITH CONTRAST TECHNIQUE: Multidetector CT imaging of the abdomen and pelvis was performed using the standard protocol following bolus administration of intravenous contrast. CONTRAST:  OMNIPAQUE IOHEXOL 300 MG/ML  SOLN COMPARISON:  10/22/2019 FINDINGS: Lower chest: Unremarkable. Hepatobiliary: No suspicious focal abnormality within the liver parenchyma. There is no evidence for gallstones, gallbladder wall thickening, or pericholecystic fluid. No intrahepatic or extrahepatic biliary dilation. Pancreas: No focal mass lesion. No dilatation of the main duct. No intraparenchymal cyst. No peripancreatic edema. Spleen: No splenomegaly. No focal mass lesion. Adrenals/Urinary Tract: No adrenal nodule or mass. Kidneys unremarkable. No evidence for hydroureter. The urinary bladder appears normal for the degree of distention. Stomach/Bowel: Stomach is unremarkable. No gastric wall thickening. No evidence of outlet obstruction. Duodenum is normally positioned as is the ligament of Treitz. No small bowel wall thickening. No small bowel dilatation. The terminal ileum is normal. The appendix is not visualized, appendiceal diameter upper normal  at 6-7 mm, stable in the interval without substantial periappendiceal edema or inflammation. Diverticuli are seen scattered along the entire length of the colon without CT findings of diverticulitis. The edema/inflammation associated with the proximal sigmoid colon is similar to prior. The tiny abscess in the paracolic fat adjacent to the proximal sigmoid colon is similar measuring 2.4 x 1.2 cm today compared to 3.1 x 1.3 cm when I remeasure in a similar fashion on the prior study. The additional small bowel mesenteric abscess demonstrating tubular configuration has clearly decreased in the interval measuring 5.2 x 0.8 cm today compared to 6.8 x 1.7 cm previously. There is persistent edema/inflammation in the left omentum. Vascular/Lymphatic: No abdominal aortic aneurysm. No abdominal aortic atherosclerotic calcification. There is no gastrohepatic or hepatoduodenal ligament lymphadenopathy. No retroperitoneal or mesenteric lymphadenopathy. No pelvic sidewall lymphadenopathy. Reproductive: Unremarkable. Other: No intraperitoneal free fluid. Musculoskeletal: No worrisome lytic or sclerotic osseous abnormality. IMPRESSION: 1. No substantial interval change in  the tiny abscess in the paracolic fat adjacent to the proximal sigmoid colon. The elongated tubular small-bowel mesenteric abscess seen on the previous study has decreased in the interval. 2. Interval resolution of the bilateral pleural effusions. 3. Persistent edema/inflammation in the left omentum. Electronically Signed   By: Kennith Center M.D.   On: 10/27/2019 07:58   [place] EKG SITE RITE  Result Date: 10/27/2019 If Site Rite image not attached, placement could not be confirmed due to current cardiac rhythm.     Management plans discussed with the patient, family and they are in agreement.  CODE STATUS:     Code Status Orders  (From admission, onward)         Start     Ordered   10/18/19 0356  Full code  Continuous     10/18/19 0356         Code Status History    Date Active Date Inactive Code Status Order ID Comments User Context   09/16/2019 2045 09/19/2019 1948 Full Code [id number]  Leafy Ro, MD ED   09/03/2019 2108 09/12/2019 1951 Full Code [id number]  Campbell Lerner, MD ED   Advance Care Planning Activity      TOTAL TIME TAKING CARE OF THIS PATIENT: 35 minutes.    Alford Highland M.D on 10/27/2019 at 1:22 PM  Between 7am to 6pm - Pager - [phone]  After 6pm go to [email or web address] - password [email or web address]  [facility]  CC: Primary care physician; Cathie Hoops, PA

## 2019-10-27 NOTE — TOC Initial Note (Signed)
Transition of Care [facility]) - Initial/Assessment Note    Patient Details  Name: Sara Chapman MRN: [id number] Date of Birth: 28-Oct-1975  Transition of Care [facility]) CM/SW Contact:    Trenton Founds, RN Phone Number: 10/27/2019, 11:06 AM  Clinical Narrative:  RNCM assessed patient by telephone. Patient reports that she is ready to go home. Patient is independent from home. Plan is for patient to get PICC and she will d/c home with IVAB for 2 weeks.  Spoke with Jeri Modena with infusion company and she will make referral to [facility] for nursing since her only need is the IVAB.            Expected Discharge Plan: Home w Home Health Services Barriers to Discharge: Barriers Resolved   Patient Goals and CMS Choice Patient states their goals for this hospitalization and ongoing recovery are:: I want to go home   Choice offered to / list presented to : Patient  Expected Discharge Plan and Services Expected Discharge Plan: Home w Home Health Services   Discharge Planning Services: CM Consult Post Acute Care Choice: Home Health Living arrangements for the past 2 months: Single Family Home Expected Discharge Date: 10/27/19                         HH Arranged: RN HH Agency: [facility] Date HH Agency Contacted: 10/27/19 Time HH Agency Contacted: 1050 Representative spoke with at [facility] Agency: Jeri Modena  Prior Living Arrangements/Services Living arrangements for the past 2 months: Single Family Home Lives with:: Spouse   Do you feel safe going back to the place where you live?: Yes               Activities of Daily Living Home Assistive Devices/Equipment: None ADL Screening (condition at time of admission) Patient's cognitive ability adequate to safely complete daily activities?: Yes Is the patient deaf or have difficulty hearing?: No Does the patient have difficulty seeing, even when wearing glasses/contacts?: No Does the patient have difficulty concentrating, remembering,  or making decisions?: No Patient able to express need for assistance with ADLs?: Yes Does the patient have difficulty dressing or bathing?: No Independently performs ADLs?: Yes (appropriate for developmental age) Does the patient have difficulty walking or climbing stairs?: No Weakness of Legs: None Weakness of Arms/Hands: None  Permission Sought/Granted                  Emotional Assessment   Attitude/Demeanor/Rapport: Engaged Affect (typically observed): Appropriate Orientation: : Oriented to Self, Oriented to Place, Oriented to  Time, Oriented to Situation Alcohol / Substance Use: Not Applicable Psych Involvement: No (comment)  Admission diagnosis:  Diverticulitis [K57.92] Generalized abdominal pain [R10.84] Acute diverticulitis [K57.92] Nausea and vomiting, intractability of vomiting not specified, unspecified vomiting type [R11.2] Patient Active Problem List   Diagnosis Date Noted  . 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HCC)   . Psoriatic arthritis (HCC)   . Acute diverticulitis 10/18/2019  . Sepsis (HCC) 10/18/2019  . Diverticulitis large intestine 09/16/2019  . Diverticulitis of large intestine with perforation and abscess 09/03/2019   PCP:  Cathie Hoops, PA Pharmacy:   [facility] - [place], [place] - [address] [place] [address] Phone: [phone] Fax: [phone]     Social Determinants of Health (SDOH) Interventions    Readmission Risk Interventions No flowsheet data found.

## 2019-10-29 LAB — AEROBIC/ANAEROBIC CULTURE W GRAM STAIN (SURGICAL/DEEP WOUND): Culture: NO GROWTH

## 2019-11-04 ENCOUNTER — Ambulatory Visit: Payer: BC Managed Care – PPO | Attending: Infectious Diseases | Admitting: Infectious Diseases

## 2019-11-04 ENCOUNTER — Other Ambulatory Visit: Payer: Self-pay

## 2019-11-04 ENCOUNTER — Encounter: Payer: Self-pay | Admitting: Surgery

## 2019-11-04 ENCOUNTER — Ambulatory Visit (INDEPENDENT_AMBULATORY_CARE_PROVIDER_SITE_OTHER): Payer: BC Managed Care – PPO | Admitting: Surgery

## 2019-11-04 ENCOUNTER — Ambulatory Visit: Payer: Self-pay | Admitting: Surgery

## 2019-11-04 ENCOUNTER — Inpatient Hospital Stay: Payer: BC Managed Care – PPO | Admitting: General Surgery

## 2019-11-04 ENCOUNTER — Encounter: Payer: Self-pay | Admitting: Infectious Diseases

## 2019-11-04 VITALS — BP 144/104 | HR 98 | Temp 98.1°F | Resp 16 | Ht 59.0 in | Wt 147.0 lb

## 2019-11-04 VITALS — BP 149/98 | HR 120 | Temp 98.1°F | Resp 14 | Ht 59.0 in | Wt 147.6 lb

## 2019-11-04 DIAGNOSIS — I10 Essential (primary) hypertension: Secondary | ICD-10-CM | POA: Diagnosis not present

## 2019-11-04 DIAGNOSIS — K5720 Diverticulitis of large intestine with perforation and abscess without bleeding: Secondary | ICD-10-CM

## 2019-11-04 DIAGNOSIS — K5732 Diverticulitis of large intestine without perforation or abscess without bleeding: Secondary | ICD-10-CM | POA: Insufficient documentation

## 2019-11-04 MED ORDER — POLYETHYLENE GLYCOL 3350 17 GM/SCOOP PO POWD: ORAL | 0 refills | Status: DC

## 2019-11-04 NOTE — Progress Notes (Signed)
NAME: Sara Chapman  DOB: Sep 25, 1975  MRN: [id number]  Date/Time: 11/04/2019 11:50 AM   Subjective:   ?follow up after recent hospitalization for complicated diverticulitis   Sara Chapman is a 44 y.o. female with a history of complicated diverticulitis .Stomacjh pain started 08/31/19 and she went to [facility] ED on 09/02/19 and was sent home on oral antibiotics. She was ithen hospitalized at [facility] between 2/3-2/12/21 for sigmoid diverticular abscess and given IV zosyn and then had 2 drain placement on 09/09/19 and culture growing bifidobacterium. She was discharged on PO augmentin. Readmitted on 2/16-2/19 after removal of a drain on 09/16/19- sent home on augmentin. 2nd drain  removed on 3/2 as OP . Was on cipro+ flagyl Followed by surgery as OP  Readmitted on 3/20 with increasing pain left lower quadrant. CT scan showed Increased inflammatory change surrounding the left upper quadrant small bowel and the junction of the sigmoid and descending colon,likely indicating worsening/recurrent diverticulitis. Small amounts of free air in the left mid abdomen may have been introduced by the drainage catheter, but recurrent perforation could also cause this Appearance.Blood culture grew 2/2 staph hemolyticus Seen by dr.Fitzgerald ID- had 10cc of paracolic abscess drained and culture was negative- so after 10 days of IV vanco along with gram negative coverage she was discharged home on IV ceftriaxone and PO flagyl on 10/27/19 to complete antibiotic on l 11/09/19  Pt says she has no fever, still gets pain left side of abdomen if she eats anything solid- has watery stool because she is only taking liquids She is fatigued 100% adherent to IV antibiotic and oral flagyl    Past Medical History:  Diagnosis Date  . Bowel perforation (HCC)   . Psoriatic arthritis (HCC)   . Stomach ulcer     No past surgical history on file.  Social History   Socioeconomic History  . Marital status: Married    Spouse  name: Not on file  . Number of children: Not on file  . Years of education: Not on file  . Highest education level: Not on file  Occupational History  . Not on file  Tobacco Use  . Smoking status: Never Smoker  . Smokeless tobacco: Never Used  Substance and Sexual Activity  . Alcohol use: Never  . Drug use: Never  . Sexual activity: Not on file  Other Topics Concern  . Not on file  Social History Narrative  . Not on file   Social Determinants of Health   Financial Resource Strain:   . Difficulty of Paying Living Expenses:   Food Insecurity:   . Worried About Programme researcher, broadcasting/film/video in the Last Year:   . Barista in the Last Year:   Transportation Needs:   . Freight forwarder (Medical):   Marland Kitchen Lack of Transportation (Non-Medical):   Physical Activity:   . Days of Exercise per Week:   . Minutes of Exercise per Session:   Stress:   . Feeling of Stress :   Social Connections:   . Frequency of Communication with Friends and Family:   . Frequency of Social Gatherings with Friends and Family:   . Attends Religious Services:   . Active Member of Clubs or Organizations:   . Attends Banker Meetings:   Marland Kitchen Marital Status:   Intimate Partner Violence:   . Fear of Current or Ex-Partner:   . Emotionally Abused:   Marland Kitchen Physically Abused:   . Sexually Abused:  No family history on file. Allergies  Allergen Reactions  . Clonazepam Other (See Comments)    SUICIDAL THOUGHTS SUICIDAL THOUGHTS SUICIDAL THOUGHTS   . Ibuprofen Other (See Comments)    stomach ulcers stomach ulcers stomach ulcers stomach ulcers   . Nsaids Other (See Comments)    GI ulcers  . Pseudoephedrine Other (See Comments)    palpitations palpitations   . Tolmetin Other (See Comments)    GI ulcers GI ulcers   . Pseudoephedrine Hcl Other (See Comments) and Palpitations    Unable to tolerate Unable to tolerate   . Tape Rash    Paper tape breaks down the skin and causes sores     ? Current Outpatient Medications  Medication Sig Dispense Refill  . amLODipine (NORVASC) 5 MG tablet Take 1 tablet (5 mg total) by mouth daily. 30 tablet 0  . cefTRIAXone (ROCEPHIN) IVPB Inject 2 g into the vein daily for 13 days. Indication:  Complicated diverticulitis Last Day of Therapy:  11/09/2019 Labs - Once weekly:  CBC/D and CMP 13 Units 0  . metroNIDAZOLE (FLAGYL) 500 MG tablet Take 1 tablet (500 mg total) by mouth 3 (three) times daily for 14 days. 42 tablet 0  . omeprazole (PRILOSEC) 40 MG capsule Take 40 mg by mouth 2 (two) times daily.    . ondansetron (ZOFRAN-ODT) 4 MG disintegrating tablet Take 1 tablet (4 mg total) by mouth every 6 (six) hours as needed for nausea. 20 tablet 0  . sodium chloride flush (NS) 0.9 % SOLN 43ml before and after antibiotic infusion 520 mL 0  . topiramate (TOPAMAX) 50 MG tablet Take 1 tablet (50 mg total) by mouth 2 (two) times daily. 60 tablet 0  . traMADol (ULTRAM) 50 MG tablet Take 1 tablet (50 mg total) by mouth every 8 (eight) hours as needed for severe pain. 10 tablet 0   No current facility-administered medications for this visit.     Abtx:  Anti-infectives (From admission, onward)   None      REVIEW OF SYSTEMS:  Const: negative fever, negative chills,  Eyes: negative diplopia or visual changes, negative eye pain ENT: negative coryza, negative sore throat Resp: negative cough, hemoptysis, dyspnea Cards: negative for chest pain, palpitations, lower extremity edema GU: negative for frequency, dysuria and hematuria GI: abdominal pain, diarrhea, no bleeding, constipation Skin: negative for rash and pruritus Heme: negative for easy bruising and gum/nose bleeding MS: generalized fatigue ,negative for myalgias, arthralgias, back pain and muscle weakness Neurolo:negative for headaches, dizziness, vertigo, memory problems  Psych: negative for feelings of anxiety, depression  Endocrine: negative for thyroid, diabetes Allergy/Immunology-  negative for any medication or food allergies ?  Objective:  VITALS:  Wt 147 lb (66.7 kg)   LMP 10/15/2019 Comment: neg preg test 10/18/19  BMI 29.69 kg/m  PHYSICAL EXAM:  General: Alert, cooperative, no distress, Head: Normocephalic, without obvious abnormality, atraumatic. Eyes: Conjunctivae clear, anicteric sclerae. Pupils are equal ENT Nares normal. No drainage or sinus tenderness. Lips, mucosa, and tongue normal. No Thrush Neck: Supple, symmetrical, no adenopathy, thyroid: non tender no carotid bruit and no JVD. Back: No CVA tenderness. Lungs: Clear to auscultation bilaterally. No Wheezing or Rhonchi. No rales. Heart: Regular rate and rhythm, no murmur, rub or gallop. Abdomen: Soft, non-tender ( on gentle palpation),not distended. Bowel sounds normal. No masses Extremities: atraumatic, no cyanosis. No edema. No clubbing Skin: No rashes or lesions. Or bruising Lymph: Cervical, supraclavicular normal. Neurologic: Grossly non-focal Pertinent Labs Lab Results 11/03/19 WBC 7.3 HB  12.4 PLT 439 Cr 1.07 Albumin 4.1  ? Impression/Recommendation ? ?Complicated sigmoid diverticulitis with diverticular abscess, paracolic  abscess since feb 2021 Has had drains and been on antibiotics since then- combination of IV /PO Currently on IV ceftriaxone and PO flagyl. End date 4/11 Will keep PICC She has an appt with Surgeon this afternoon Will discuss with Dr.rodenberg regarding further management plan  HTN- started amlodipine 5mg  during recent hospitalization- still not well controlled- she will follow up with her PCP.  Follow up with me will depend on surgeon's plan ? ___________________________________________________ Discussed with patient,and her husband Communicated with Dr.Rodenberg

## 2019-11-04 NOTE — Progress Notes (Signed)
Patient ID: Sara Chapman, female   DOB: 1975-09-23, 44 y.o.   MRN: [id number]  Chief Complaint: Follow-up complicated diverticulitis  History of Present Illness Sara Chapman is a 44 y.o. female with first follow-up after recent hospitalization for another flareup/fluid collection.  Discharged home with PICC line and IV Rocephin and p.o. Flagyl limiting diet to liquids.  She reported another flareup over the weekend Saturday and Sunday having significant mild left lower quadrant pain.  She had no fevers.  For this she stopped taking yogurt, resumed her liquid diet and the pain improved.  She reports nausea and loose bowel movements.  She is ordered a liquid protein supplement.  She is doing about all I could expect considering what we have asked of her.  Past Medical History Past Medical History:  Diagnosis Date  . Bowel perforation (HCC)   . Psoriatic arthritis (HCC)   . Stomach ulcer       No past surgical history on file.  Allergies  Allergen Reactions  . Clonazepam Other (See Comments)    SUICIDAL THOUGHTS SUICIDAL THOUGHTS SUICIDAL THOUGHTS   . Ibuprofen Other (See Comments)    stomach ulcers stomach ulcers stomach ulcers stomach ulcers   . Nsaids Other (See Comments)    GI ulcers  . Pseudoephedrine Other (See Comments)    palpitations palpitations   . Tolmetin Other (See Comments)    GI ulcers GI ulcers   . Pseudoephedrine Hcl Other (See Comments) and Palpitations    Unable to tolerate Unable to tolerate   . Tape Rash    Paper tape breaks down the skin and causes sores    Current Outpatient Medications  Medication Sig Dispense Refill  . amLODipine (NORVASC) 5 MG tablet Take 1 tablet (5 mg total) by mouth daily. 30 tablet 0  . cefTRIAXone (ROCEPHIN) IVPB Inject 2 g into the vein daily for 13 days. Indication:  Complicated diverticulitis Last Day of Therapy:  11/09/2019 Labs - Once weekly:  CBC/D and CMP 13 Units 0  . metroNIDAZOLE (FLAGYL) 500  MG tablet Take 1 tablet (500 mg total) by mouth 3 (three) times daily for 14 days. 42 tablet 0  . omeprazole (PRILOSEC) 40 MG capsule Take 40 mg by mouth 2 (two) times daily.    . ondansetron (ZOFRAN-ODT) 4 MG disintegrating tablet Take 1 tablet (4 mg total) by mouth every 6 (six) hours as needed for nausea. 20 tablet 0  . sodium chloride flush (NS) 0.9 % SOLN 32ml before and after antibiotic infusion 520 mL 0  . topiramate (TOPAMAX) 50 MG tablet Take 1 tablet (50 mg total) by mouth 2 (two) times daily. 60 tablet 0  . traMADol (ULTRAM) 50 MG tablet Take 1 tablet (50 mg total) by mouth every 8 (eight) hours as needed for severe pain. 10 tablet 0  . polyethylene glycol powder (MIRALAX) 17 GM/SCOOP powder Mix full container in 64 ounces of Gatorade or other clear liquid. NO Red 238 g 0   No current facility-administered medications for this visit.    Family History No family history on file.    Social History Social History   Tobacco Use  . Smoking status: Never Smoker  . Smokeless tobacco: Never Used  Substance Use Topics  . Alcohol use: Never  . Drug use: Never        Review of Systems  All other systems reviewed and are negative.   Physical Exam Blood pressure (!) 149/98, pulse (!) 120, temperature 98.1 F (36.7 C),  resp. rate 14, height 4\' 11"  (1.499 m), weight 147 lb 9.6 oz (67 kg), last menstrual period 10/15/2019, SpO2 98 %. Last Weight  Most recent update: 11/04/2019  2:34 PM   Weight  67 kg (147 lb 9.6 oz)            CONSTITUTIONAL: Well developed, and nourished, appropriately responsive and aware without distress.   EYES: Sclera non-icteric.   EARS, NOSE, MOUTH AND THROAT: Mask worn.    Hearing is intact to voice.  NECK: Trachea is midline, and there is no jugular venous distension.  LYMPH NODES:  Lymph nodes in the neck are not enlarged. RESPIRATORY:  Lungs are clear, and breath sounds are equal bilaterally. Normal respiratory effort without pathologic use of  accessory muscles. CARDIOVASCULAR: Heart is regular in rate and rhythm. GI: The abdomen is  soft, nontender, and nondistended.  She remains tender to deep palpation, there were normal bowel sounds. MUSCULOSKELETAL:  Symmetrical muscle tone appreciated in all four extremities.    SKIN: Skin turgor is normal. No pathologic skin lesions appreciated.  NEUROLOGIC:  Motor and sensation appear grossly normal.  Cranial nerves are grossly without defect. PSYCH:  Alert and oriented to person, place and time. Affect is appropriate for situation.  Data Reviewed I have personally reviewed what is currently available of the patient's imaging, recent labs and medical records.   Labs:  CBC Latest Ref Rng & Units 10/26/2019 10/24/2019 10/22/2019  WBC 4.0 - 10.5 K/uL 6.3 5.9 4.1  Hemoglobin 12.0 - 15.0 g/dL 11.4(L) 10.8(L) 9.0(L)  Hematocrit 36.0 - 46.0 % 34.5(L) 32.7(L) 26.8(L)  Platelets 150 - 400 K/uL 390 335 219   CMP Latest Ref Rng & Units 10/26/2019 10/24/2019 10/23/2019  Glucose 70 - 99 mg/dL 97 95 111(H)  BUN 6 - 20 mg/dL 5(L) 5(L) <5(L)  Creatinine 0.44 - 1.00 mg/dL 1.25(H) 1.32(H) 1.27(H)  Sodium 135 - 145 mmol/L 139 141 141  Potassium 3.5 - 5.1 mmol/L 4.0 3.7 3.0(L)  Chloride 98 - 111 mmol/L 115(H) 113(H) 109  CO2 22 - 32 mmol/L 15(L) 20(L) 24  Calcium 8.9 - 10.3 mg/dL 8.5(L) 8.2(L) 8.5(L)  Total Protein 6.5 - 8.1 g/dL 6.1(L) - -  Total Bilirubin 0.3 - 1.2 mg/dL 0.5 - -  Alkaline Phos 38 - 126 U/L 99 - -  AST 15 - 41 U/L 14(L) - -  ALT 0 - 44 U/L 19 - -      Imaging:  Within last 24 hrs: No results found.  Assessment    Complicated left colonic/sigmoid diverticulitis. Balancing the pros and cons of seeking ideal timing/dietary management and antibiotic management.  I believe we will come to conclusions that any attempt to put this off for another 2 weeks is unrealistic. Patient Active Problem List   Diagnosis Date Noted  . Essential hypertension   . Chronic nonintractable headache    . 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 Psoriatic arthritis ([place])   . Acute diverticulitis 10/18/2019  . Sepsis ([place]) 10/18/2019  . Diverticulitis large intestine 09/16/2019  . Colonic diverticular abscess 09/03/2019    Plan    We have agreed to keep her diet is limited as she will tolerate.  I suspect diminishing the use of oral Flagyl may be helpful in tolerating the next 2 weeks.  We will continue her IV Rocephin through her surgery and are aiming for surgery on April 21. Robotic sigmoid colectomy anticipated with associated lysis of adhesions.  Realizing minimally invasive surgery may not remain  a viable option and decisions likely to be made during surgery for conversion if needed. Worst-case scenarios of surgery include but are not limited to complications of anesthesia, bleeding, infection, injury to adjacent/scarred inflamed viscera.  Colostomy or other ostomy.  An open operation. She is aware that should complications arise this may require an additional operation to reverse any ostomies created.  She would like to proceed, questions answered, no guarantees ever expressed or implied.  Face-to-face time spent with the patient and accompanying care providers(if present) was 30 minutes, with more than 50% of the time spent counseling, educating, and coordinating care of the patient.      Mollie Rossano M.D., FACS 11/04/2019, 4:20 PM     

## 2019-11-04 NOTE — Patient Instructions (Signed)
You are here for follow up of the diverticular abscess - you are on IV ceftriaxone- You will follow up wit Dr.Rodenberg this afternoon- will decide on a plan after that

## 2019-11-04 NOTE — H&P (View-Only) (Signed)
Patient ID: Sara Chapman, female   DOB: 1975-09-23, 44 y.o.   MRN: [id number]  Chief Complaint: Follow-up complicated diverticulitis  History of Present Illness Sara Chapman is a 44 y.o. female with first follow-up after recent hospitalization for another flareup/fluid collection.  Discharged home with PICC line and IV Rocephin and p.o. Flagyl limiting diet to liquids.  She reported another flareup over the weekend Saturday and Sunday having significant mild left lower quadrant pain.  She had no fevers.  For this she stopped taking yogurt, resumed her liquid diet and the pain improved.  She reports nausea and loose bowel movements.  She is ordered a liquid protein supplement.  She is doing about all I could expect considering what we have asked of her.  Past Medical History Past Medical History:  Diagnosis Date  . Bowel perforation (HCC)   . Psoriatic arthritis (HCC)   . Stomach ulcer       No past surgical history on file.  Allergies  Allergen Reactions  . Clonazepam Other (See Comments)    SUICIDAL THOUGHTS SUICIDAL THOUGHTS SUICIDAL THOUGHTS   . Ibuprofen Other (See Comments)    stomach ulcers stomach ulcers stomach ulcers stomach ulcers   . Nsaids Other (See Comments)    GI ulcers  . Pseudoephedrine Other (See Comments)    palpitations palpitations   . Tolmetin Other (See Comments)    GI ulcers GI ulcers   . Pseudoephedrine Hcl Other (See Comments) and Palpitations    Unable to tolerate Unable to tolerate   . Tape Rash    Paper tape breaks down the skin and causes sores    Current Outpatient Medications  Medication Sig Dispense Refill  . amLODipine (NORVASC) 5 MG tablet Take 1 tablet (5 mg total) by mouth daily. 30 tablet 0  . cefTRIAXone (ROCEPHIN) IVPB Inject 2 g into the vein daily for 13 days. Indication:  Complicated diverticulitis Last Day of Therapy:  11/09/2019 Labs - Once weekly:  CBC/D and CMP 13 Units 0  . metroNIDAZOLE (FLAGYL) 500  MG tablet Take 1 tablet (500 mg total) by mouth 3 (three) times daily for 14 days. 42 tablet 0  . omeprazole (PRILOSEC) 40 MG capsule Take 40 mg by mouth 2 (two) times daily.    . ondansetron (ZOFRAN-ODT) 4 MG disintegrating tablet Take 1 tablet (4 mg total) by mouth every 6 (six) hours as needed for nausea. 20 tablet 0  . sodium chloride flush (NS) 0.9 % SOLN 32ml before and after antibiotic infusion 520 mL 0  . topiramate (TOPAMAX) 50 MG tablet Take 1 tablet (50 mg total) by mouth 2 (two) times daily. 60 tablet 0  . traMADol (ULTRAM) 50 MG tablet Take 1 tablet (50 mg total) by mouth every 8 (eight) hours as needed for severe pain. 10 tablet 0  . polyethylene glycol powder (MIRALAX) 17 GM/SCOOP powder Mix full container in 64 ounces of Gatorade or other clear liquid. NO Red 238 g 0   No current facility-administered medications for this visit.    Family History No family history on file.    Social History Social History   Tobacco Use  . Smoking status: Never Smoker  . Smokeless tobacco: Never Used  Substance Use Topics  . Alcohol use: Never  . Drug use: Never        Review of Systems  All other systems reviewed and are negative.   Physical Exam Blood pressure (!) 149/98, pulse (!) 120, temperature 98.1 F (36.7 C),  resp. rate 14, height 4\' 11"  (1.499 m), weight 147 lb 9.6 oz (67 kg), last menstrual period 10/15/2019, SpO2 98 %. Last Weight  Most recent update: 11/04/2019  2:34 PM   Weight  67 kg (147 lb 9.6 oz)            CONSTITUTIONAL: Well developed, and nourished, appropriately responsive and aware without distress.   EYES: Sclera non-icteric.   EARS, NOSE, MOUTH AND THROAT: Mask worn.    Hearing is intact to voice.  NECK: Trachea is midline, and there is no jugular venous distension.  LYMPH NODES:  Lymph nodes in the neck are not enlarged. RESPIRATORY:  Lungs are clear, and breath sounds are equal bilaterally. Normal respiratory effort without pathologic use of  accessory muscles. CARDIOVASCULAR: Heart is regular in rate and rhythm. GI: The abdomen is  soft, nontender, and nondistended.  She remains tender to deep palpation, there were normal bowel sounds. MUSCULOSKELETAL:  Symmetrical muscle tone appreciated in all four extremities.    SKIN: Skin turgor is normal. No pathologic skin lesions appreciated.  NEUROLOGIC:  Motor and sensation appear grossly normal.  Cranial nerves are grossly without defect. PSYCH:  Alert and oriented to person, place and time. Affect is appropriate for situation.  Data Reviewed I have personally reviewed what is currently available of the patient's imaging, recent labs and medical records.   Labs:  CBC Latest Ref Rng & Units 10/26/2019 10/24/2019 10/22/2019  WBC 4.0 - 10.5 K/uL 6.3 5.9 4.1  Hemoglobin 12.0 - 15.0 g/dL 11.4(L) 10.8(L) 9.0(L)  Hematocrit 36.0 - 46.0 % 34.5(L) 32.7(L) 26.8(L)  Platelets 150 - 400 K/uL 390 335 219   CMP Latest Ref Rng & Units 10/26/2019 10/24/2019 10/23/2019  Glucose 70 - 99 mg/dL 97 95 111(H)  BUN 6 - 20 mg/dL 5(L) 5(L) <5(L)  Creatinine 0.44 - 1.00 mg/dL 1.25(H) 1.32(H) 1.27(H)  Sodium 135 - 145 mmol/L 139 141 141  Potassium 3.5 - 5.1 mmol/L 4.0 3.7 3.0(L)  Chloride 98 - 111 mmol/L 115(H) 113(H) 109  CO2 22 - 32 mmol/L 15(L) 20(L) 24  Calcium 8.9 - 10.3 mg/dL 8.5(L) 8.2(L) 8.5(L)  Total Protein 6.5 - 8.1 g/dL 6.1(L) - -  Total Bilirubin 0.3 - 1.2 mg/dL 0.5 - -  Alkaline Phos 38 - 126 U/L 99 - -  AST 15 - 41 U/L 14(L) - -  ALT 0 - 44 U/L 19 - -      Imaging:  Within last 24 hrs: No results found.  Assessment    Complicated left colonic/sigmoid diverticulitis. Balancing the pros and cons of seeking ideal timing/dietary management and antibiotic management.  I believe we will come to conclusions that any attempt to put this off for another 2 weeks is unrealistic. Patient Active Problem List   Diagnosis Date Noted  . Essential hypertension   . Chronic nonintractable headache    . 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 Psoriatic arthritis ([place])   . Acute diverticulitis 10/18/2019  . Sepsis ([place]) 10/18/2019  . Diverticulitis large intestine 09/16/2019  . Colonic diverticular abscess 09/03/2019    Plan    We have agreed to keep her diet is limited as she will tolerate.  I suspect diminishing the use of oral Flagyl may be helpful in tolerating the next 2 weeks.  We will continue her IV Rocephin through her surgery and are aiming for surgery on April 21. Robotic sigmoid colectomy anticipated with associated lysis of adhesions.  Realizing minimally invasive surgery may not remain  a viable option and decisions likely to be made during surgery for conversion if needed. Worst-case scenarios of surgery include but are not limited to complications of anesthesia, bleeding, infection, injury to adjacent/scarred inflamed viscera.  Colostomy or other ostomy.  An open operation. She is aware that should complications arise this may require an additional operation to reverse any ostomies created.  She would like to proceed, questions answered, no guarantees ever expressed or implied.  Face-to-face time spent with the patient and accompanying care providers(if present) was 30 minutes, with more than 50% of the time spent counseling, educating, and coordinating care of the patient.      Campbell Lerner M.D., FACS 11/04/2019, 4:20 PM

## 2019-11-04 NOTE — Patient Instructions (Addendum)
May use clear high protein drinks to help with nutrition.  May try simple cereals like cheerios or shredded wheat.   We will speak with Dr Rivka Safer about your antibiotics. We will call you about this.  We have discussed removing a portion of your damaged colon through 4 small incisions today. We have scheduled this surgery at [facility] with Dr. Claudine Mouton. Please plan a hospital stay of 3-7 days for surgery and recovery time.  We will have you complete a modified bowel prep prior to your surgery. Please see information provided.  You have also been given a (Blue) Pre-Care Sheet with more information regarding your particular surgery. Our surgery scheduler will be in contact with you about surgery date and information.   Please call our office with any questions or concerns prior to your scheduled surgery.   Laparoscopic Colectomy Laparoscopic colectomy is surgery to remove part or all of the large intestine (colon). This procedure may be used to treat several conditions, including:  Inflammation and infection of the colon (diverticulitis).  Tumors or masses in the colon.  Inflammatory bowel disease, such as Crohn disease or ulcerative colitis. Colectomy is an option when symptoms cannot be controlled with medicines.  Bleeding from the colon that cannot be controlled by another method.  Blockage or obstruction of the colon.  Tell a health care provider about:  Any allergies you have.  All medicines you are taking, including vitamins, herbs, eye drops, creams, and over-the-counter medicines.  Any problems you or family members have had with anesthetic medicines.  Any blood disorders you have.  Any surgeries you have had.  Any medical conditions you have. What are the risks? Generally, this is a safe procedure. However, problems may occur, including:  Infection.  Bleeding.  Allergic reactions to medicines or dyes.  Damage to other structures or organs.  Leaking from where  the colon was sewn together.  Future blockage of the small intestines from scar tissue. Another surgery may be needed to repair this.  Needing to convert to an open procedure. Complications such as damage to other organs or excessive bleeding may require the surgeon to convert from a laparoscopic procedure to an open procedure. This involves making a larger incision in the abdomen.  What happens before the procedure? Staying hydrated Follow instructions from your health care provider about hydration, which may include:  Up to 2 hours before the procedure - you may continue to drink clear liquids, such as water, clear fruit juice, black coffee, and plain tea.  Eating and drinking restrictions Follow instructions from your health care provider about eating and drinking, which may include:  8 hours before the procedure - stop eating heavy meals, meals with high fiber, or foods such as meat, fried foods, or fatty foods.  6 hours before the procedure - stop eating light meals or foods, such as toast or cereal.  6 hours before the procedure - stop drinking milk or drinks that contain milk.  2 hours before the procedure - stop drinking clear liquids.  Medicines  Ask your health care provider about: ? Changing or stopping your regular medicines. This is especially important if you are taking diabetes medicines or blood thinners. ? Taking medicines such as aspirin and ibuprofen. These medicines can thin your blood. Do not take these medicines before your procedure if your health care provider instructs you not to.  You may be given antibiotic medicine to clean out bacteria from your colon. Follow the directions carefully and take the  medicine at the correct time. General instructions  You may be prescribed an oral bowel prep to clean out your colon in preparation for the surgery: ? Follow instructions from your health care provider about how to do this. ? Do not eat or drink anything else  after you have started the bowel prep, unless your health care provider tells you it is safe to do so.  Do not use any products that contain nicotine or tobacco, such as cigarettes and e-cigarettes. If you need help quitting, ask your health care provider. What happens during the procedure?  To reduce your risk of infection: ? Your health care team will wash or sanitize their hands. ? Your skin will be washed with soap.  An IV tube will be inserted into one of your veins to deliver fluid and medication.  You will be given one of the following: ? A medicine to help you relax (sedative). ? A medicine to make you fall asleep (general anesthetic).  Small monitors will be connected to your body. They will be used to check your heart, blood pressure, and oxygen level.  A breathing tube may be placed into your lungs during the procedure.  A thin, flexible tube (catheter) will be placed into your bladder to drain urine.  A tube may be placed through your nose and into your stomach to drain stomach fluids (nasogastric tube, or NG tube).  Your abdomen will be filled with air so it expands. This gives the surgeon more room to operate and makes your organs easier to see.  Several small cuts (incisions) will be made in your abdomen.  A thin, lighted tube with a tiny camera on the end (laparoscope) will be put through one of the small incisions. The camera on the laparoscope will send a picture to a computer screen in the operating room. This will give the surgeon a good view inside your abdomen.  Hollow tubes will be put through the other small incisions in your abdomen. The tools that are needed for the procedure will be put through these tubes.  Clamps or staples will be put on both ends of the diseased part of the colon.  The part of the intestine between the clamps or staples will be removed.  If possible, the ends of the healthy colon that remain will be stitched (sutured) or stapled  together to allow your body to pass waste (stool).  Sometimes, the remaining colon cannot be stitched back together. If this is the case, a colostomy will be needed. If you need a colostomy: ? An opening to the outside of your body (stoma) will be made through your abdomen. ? The end of your colon will be brought to the opening. It will be stitched to the skin. ? A bag will be attached to the opening. Stool will drain into this removable bag. ? The colostomy may be temporary or permanent.  The incisions from the colectomy will be closed with sutures or staples. The procedure may vary among health care providers and hospitals. What happens after the procedure?  Your blood pressure, heart rate, breathing rate, and blood oxygen level will be monitored until the medicines you were given have worn off.  You will receive fluids through an IV tube until your bowels start to work properly.  Once your bowels are working again, you will be given clear liquids first and then solid food as tolerated.  You will be given medicines to control your pain and nausea, if needed.  Do not drive for 24 hours if you were given a sedative. This information is not intended to replace advice given to you by your health care provider. Make sure you discuss any questions you have with your health care provider. Document Released: 10/07/2002 Document Revised: 04/17/2016 Document Reviewed: 04/17/2016 Elsevier Interactive Patient Education  2018 [employer].     Laparoscopic Colectomy, Care After This sheet gives you information about how to care for yourself after your procedure. Your health care provider may also give you more specific instructions. If you have problems or questions, contact your health care provider. What can I expect after the procedure? After your procedure, it is common to have the following:  Pain in your abdomen, especially in the incision areas. You will be given medicine to control the  pain.  Tiredness. This is a normal part of the recovery process. Your energy level will return to normal over the next several weeks.  Changes in your bowel movements, such as constipation or needing to go more often. Talk with your health care provider about how to manage this.  Follow these instructions at home: Medicines  Take over-the-counter and prescription medicines only as told by your health care provider.  Do not drive or use heavy machinery while taking prescription pain medicine.  Do not drink alcohol while taking prescription pain medicine.  If you were prescribed an antibiotic medicine, use it as told by your health care provider. Do not stop using the antibiotic even if you start to feel better. Incision care  Follow instructions from your health care provider about how to take care of your incision areas. Make sure you: ? Keep your incisions clean and dry. ? Wash your hands with soap and water before and after applying medicine to the areas, and before and after changing your bandage (dressing). If soap and water are not available, use hand sanitizer. ? Change your dressing as told by your health care provider. ? Leave stitches (sutures), skin glue, or adhesive strips in place. These skin closures may need to stay in place for 2 weeks or longer. If adhesive strip edges start to loosen and curl up, you may trim the loose edges. Do not remove adhesive strips completely unless your health care provider tells you to do that.  Do not wear tight clothing over the incisions. Tight clothing may rub and irritate the incision areas, which may cause the incisions to open.  Do not take baths, swim, or use a hot tub until your health care provider approves. Ask your health care provider if you can take showers. You may only be allowed to take sponge baths for bathing.  Check your incision area every day for signs of infection. Check for: ? More redness, swelling, or pain. ? More  fluid or blood. ? Warmth. ? Pus or a bad smell. Activity  Avoid lifting anything that is heavier than 10 lb (4.5 kg) for 2 weeks or until your health care provider says it is okay.  You may resume normal activities as told by your health care provider. Ask your health care provider what activities are safe for you.  Take rest breaks during the day as needed. Eating and drinking  Follow instructions from your health care provider about what you can eat after surgery.  To prevent or treat constipation while you are taking prescription pain medicine, your health care provider may recommend that you: ? Drink enough fluid to keep your urine clear or pale yellow. ?  Take over-the-counter or prescription medicines. ? Eat foods that are high in fiber, such as fresh fruits and vegetables, whole grains, and beans. ? Limit foods that are high in fat and processed sugars, such as fried and sweet foods. General instructions  Ask your health care provider when you will need an appointment to get your sutures or staples removed.  Keep all follow-up visits as told by your health care provider. This is important. Contact a health care provider if:  You have more redness, swelling, or pain around your incisions.  You have more fluid or blood coming from the incisions.  Your incisions feel warm to the touch.  You have pus or a bad smell coming from your incisions or your dressing.  You have a fever.  You have an incision that breaks open (edges not staying together) after sutures or staples have been removed. Get help right away if:  You develop a rash.  You have chest pain or difficulty breathing.  You have pain or swelling in your legs.  You feel light-headed or you faint.  Your abdomen swells (becomes distended).  You have nausea or vomiting.  You have blood in your stool (feces). This information is not intended to replace advice given to you by your health care provider. Make  sure you discuss any questions you have with your health care provider. Document Released: 02/03/2005 Document Revised: 04/17/2016 Document Reviewed: 04/17/2016 Elsevier Interactive Patient Education  [employer].

## 2019-11-05 ENCOUNTER — Telehealth: Payer: Self-pay | Admitting: Surgery

## 2019-11-05 ENCOUNTER — Other Ambulatory Visit: Payer: Self-pay | Admitting: Radiology

## 2019-11-05 NOTE — Telephone Encounter (Signed)
Pt has been advised of Pre-Admission date/time, COVID Testing date and Surgery date.  Surgery Date: 11/19/19 Preadmission Testing Date: 11/12/19 (phone 1p-5p) Covid Testing Date: 11/17/19 - patient advised to [facility] to [facility] ([address] [facility]) between 8a-1p  Patient has been made aware to call [phone], between 1-3:00pm the day before surgery, to find out what time to arrive for surgery.

## 2019-11-06 ENCOUNTER — Telehealth: Payer: Self-pay | Admitting: Surgery

## 2019-11-06 ENCOUNTER — Other Ambulatory Visit: Payer: Self-pay

## 2019-11-06 DIAGNOSIS — A411 Sepsis due to other specified staphylococcus: Secondary | ICD-10-CM

## 2019-11-06 NOTE — Telephone Encounter (Signed)
We will call them and ask them to keep the line and also continue antibiotics.

## 2019-11-06 NOTE — Telephone Encounter (Signed)
Patient states that her home health nurse received orders to remove her PICC line at the end of this week. Patient thought that Dr. Claudine Mouton said was ok to leave in during surgery.  She is scheduled for surgery 11/19/19.  Patient needs to know what to do.  Please call her at [phone].  Thank you.

## 2019-11-11 ENCOUNTER — Telehealth: Payer: Self-pay | Admitting: *Deleted

## 2019-11-11 NOTE — Telephone Encounter (Signed)
Faxed updated FMLA to [phone]

## 2019-11-12 ENCOUNTER — Other Ambulatory Visit: Payer: Self-pay

## 2019-11-12 ENCOUNTER — Encounter
Admission: RE | Admit: 2019-11-12 | Discharge: 2019-11-12 | Disposition: A | Discharge status: Home / Self Care | Payer: BC Managed Care – PPO | Source: Ambulatory Visit | Attending: Surgery | Admitting: Surgery

## 2019-11-12 DIAGNOSIS — Z01812 Encounter for preprocedural laboratory examination: Secondary | ICD-10-CM | POA: Diagnosis present

## 2019-11-12 HISTORY — DX: Anemia, unspecified: D64.9

## 2019-11-12 HISTORY — DX: Other complications of anesthesia, initial encounter: T88.59XA

## 2019-11-12 HISTORY — DX: Gastro-esophageal reflux disease without esophagitis: K21.9

## 2019-11-12 HISTORY — DX: Personal history of urinary calculi: Z87.442

## 2019-11-12 HISTORY — DX: Nausea with vomiting, unspecified: R11.2

## 2019-11-12 HISTORY — DX: Anxiety disorder, unspecified: F41.9

## 2019-11-12 HISTORY — DX: Headache, unspecified: R51.9

## 2019-11-12 HISTORY — DX: Essential (primary) hypertension: I10

## 2019-11-12 HISTORY — DX: Sleep apnea, unspecified: G47.30

## 2019-11-12 HISTORY — DX: Other specified postprocedural states: Z98.890

## 2019-11-12 NOTE — Pre-Procedure Instructions (Signed)
Delsy Etzkorn Tsuda ECHO COMPLETE WO IMAGING ENHANCING AGENT Order# [id number] Reading physician: Antonieta Iba, MD Ordering physician: Mick Sell, MD Study date: 10/24/19  ECHOCARDIOGRAM COMPLETE (Accession [id number]) (Order [id number]) Echocardiography Date: 10/23/2019 Department: [facility] ONCOLOGY (1C) Released By/Authorizing: Mick Sell, MD (auto-released)  ECHOCARDIOGRAM COMPLETE Order #: [id number] Accession #: [id number] Patient Info  Patient name: Sara Chapman  MRN: [id number]  Age: 44 y.o.  Sex: female  MyChart Results Release  MyChart Status: Active Results Release  Vitals  BP Height Weight BSA (Calculated - sq m)  149/98 4\' 11"  (1.499 m) 67 kg 1.67 sq meters  Order-Level Documents:  Scan on 10/24/2019 8:25 PM by Default, Provider, MD     Study Result    ECHOCARDIOGRAM REPORT       Patient Name:  Sara Chapman Bibbins Date of Exam: 10/24/2019  Medical Rec #: 10/26/2019      Height:    59.0 in  Accession #:  [id number]      Weight:    158.5 lb  Date of Birth: Sep 01, 1975      BSA:     1.671 m  Patient Age:  44 years       BP:      165/93 mmHg  Patient Gender: F          HR:      82 bpm.  Exam Location: [facility]   Procedure: 2D Echo, Cardiac Doppler and Color Doppler   Indications:   Bacteremia 790.7    History:     Patient has no prior history of Echocardiogram  examinations. No          cardiac history listed.    Sonographer:   07/04/1974 RDCS (AE)  Referring Phys: [id number] DAVID P FITZGERALD  Diagnosing Phys: [id number] MD   IMPRESSIONS    1. Left ventricular ejection fraction, by estimation, is 60 to 65%. The  left ventricle has normal function. The left ventricle has no regional  wall motion abnormalities. Left ventricular diastolic parameters were  normal.  2. Right ventricular systolic function is normal. The  right ventricular  size is normal. There is normal pulmonary artery systolic pressure.  3. no valve vegetation noted   FINDINGS  Left Ventricle: Left ventricular ejection fraction, by estimation, is 60  to 65%. The left ventricle has normal function. The left ventricle has no  regional wall motion abnormalities. The left ventricular internal cavity  size was normal in size. There is  no left ventricular hypertrophy. Left ventricular diastolic parameters  were normal.   Right Ventricle: The right ventricular size is normal. No increase in  right ventricular wall thickness. Right ventricular systolic function is  normal. There is normal pulmonary artery systolic pressure. The tricuspid  regurgitant velocity is 2.36 m/s, and  with an assumed right atrial pressure of 5 mmHg, the estimated right  ventricular systolic pressure is 27.3 mmHg.   Left Atrium: Left atrial size was normal in size.   Right Atrium: Right atrial size was normal in size.   Pericardium: There is no evidence of pericardial effusion.   Mitral Valve: The mitral valve is normal in structure. Normal mobility of  the mitral valve leaflets. Mild mitral valve regurgitation. No evidence of  mitral valve stenosis.   Tricuspid Valve: The tricuspid valve is normal in structure. Tricuspid  valve regurgitation is not demonstrated. No evidence of tricuspid  stenosis.   Aortic Valve: The aortic valve is normal in structure. Aortic  valve  regurgitation is not visualized. No aortic stenosis is present. Aortic  valve mean gradient measures 1.5 mmHg. Aortic valve peak gradient measures  2.8 mmHg. Aortic valve area, by VTI  measures 3.42 cm.   Pulmonic Valve: The pulmonic valve was normal in structure. Pulmonic valve  regurgitation is not visualized. No evidence of pulmonic stenosis.   Aorta: The aortic root is normal in size and structure.   Venous: The inferior vena cava is normal in size with greater than 50%   respiratory variability, suggesting right atrial pressure of 3 mmHg.   IAS/Shunts: No atrial level shunt detected by color flow Doppler.     LEFT VENTRICLE  PLAX 2D  LVIDd:     3.87 cm Diastology  LVIDs:     2.31 cm LV e' lateral:  8.38 cm/s  LV PW:     0.86 cm LV E/e' lateral: 7.3  LV IVS:    0.70 cm LV e' medial:  4.57 cm/s  LVOT diam:   2.00 cm LV E/e' medial: 13.4  LV SV:     55  LV SV Index:  33  LVOT Area:   3.14 cm     RIGHT VENTRICLE  RV Basal diam: 2.87 cm  TAPSE (M-mode): 2.6 cm   LEFT ATRIUM      Index    RIGHT ATRIUM     Index  LA diam:   3.60 cm 2.15 cm/m RA Area:   6.46 cm  LA Vol (A2C): 15.2 ml 9.10 ml/m RA Volume:  9.94 ml 5.95 ml/m  LA Vol (A4C): 28.5 ml 17.06 ml/m  AORTIC VALVE          PULMONIC VALVE  AV Area (Vmax):  3.18 cm  PV Vmax:    0.95 m/s  AV Area (Vmean):  2.89 cm  PV Peak grad:  3.6 mmHg  AV Area (VTI):   3.42 cm  RVOT Peak grad: 3 mmHg  AV Vmax:      83.90 cm/s  AV Vmean:     60.600 cm/s  AV VTI:      0.160 m  AV Peak Grad:   2.8 mmHg  AV Mean Grad:   1.5 mmHg  LVOT Vmax:     85.00 cm/s  LVOT Vmean:    55.700 cm/s  LVOT VTI:     0.174 m  LVOT/AV VTI ratio: 1.09    AORTA  Ao Root diam: 2.60 cm   MITRAL VALVE        TRICUSPID VALVE  MV Area (PHT): 4.21 cm  TR Peak grad:  22.3 mmHg  MV Decel Time: 180 msec  TR Vmax:    236.00 cm/s  MV E velocity: 61.10 cm/s  MV A velocity: 50.95 cm/s SHUNTS  MV E/A ratio: 1.20    Systemic VTI: 0.17 m               Systemic Diam: 2.00 cm   Ida Rogue MD  Electronically signed by Ida Rogue MD  Signature Date/Time: 10/24/2019/8:25:28 PM      Final   Imaging Info  ECHOCARDIOGRAM COMPLETE (Order #[id number]) on 10/23/19  Study History  ECHOCARDIOGRAM COMPLETE (Order #[id number]) on 10/23/19  [employer] Images  Show images for  ECHOCARDIOGRAM COMPLETE  Images on [employer]  Show images for Sara Chapman  Performing Technologist/Nurse  Performing Technologist/Nurse: Gabriel Cirri  Reason for Exam Priority: Routine Not on file  Patient Data  Height 59 in    BP 165/93 mmHg  Surgical History  Surgical History  No past medical history on file.    Other Surgical History  No past medical history on file.    Implants   No active implants to display in this view.  Encounter-Level Documents - 10/18/2019:  Scan on 10/28/2019 11:34 AM by Default, Provider, MD Document on 10/27/2019 2:38 PM by Ileene Musa, RN: IP After Visit Summary Document on 10/22/2019 5:03 PM by Si Raider, NT: ED PB Billing Extract Scan on 10/21/2019 9:04 AM by Default, Provider, MD Scan on 10/21/2019 8:38 AM by Default, Provider, MD Scan on 10/18/2019 4:12 AM by Day, Red Christians: Verbal Consent Scan on 10/18/2019 1:07 AM by Default, Provider, MD     Order-Level Documents - 10/18/2019:  Scan on 10/24/2019 8:25 PM by Default, Provider, MD     Resulted by:  Signed Date/Time  Phone Pager  Antonieta Iba 10/24/2019 8:25 PM [phone]   External Result Report  External Result Report

## 2019-11-12 NOTE — Patient Instructions (Addendum)
Your procedure is scheduled on: 11-19-19 [facility] Report to Same Day Surgery 2nd floor medical mall [facility] Entrance-take elevator on left to 2nd floor.  Check in with surgery information desk.) To find out your arrival time please call [phone] between 1PM - 3PM on 11-18-19 TUESDAY  Remember: Instructions that are not followed completely may result in serious medical risk, up to and including death, or upon the discretion of your surgeon and anesthesiologist your surgery may need to be rescheduled.    _x___ 1. Do not eat food after midnight the night before your procedure. NO GUM OR CANDY AFTER MIDNIGHT. You may drink clear liquids up to 2 hours before you are scheduled to arrive at the hospital for your procedure.  Do not drink clear liquids within 2 hours of your scheduled arrival to the hospital.  Clear liquids include  --Water or Apple juice without pulp  --Gatorade  --Black Coffee or Clear Tea (No milk, no creamers, do not add anything to the coffee or Tea   ____Ensure clear carbohydrate drink on the way to the hospital for bariatric patients  _X___Ensure clear carbohydrate drink-FINISH ENSURE 2 HOURS PRIOR TO [place]    __x__ 2. No Alcohol for 24 hours before or after surgery.   __x__3. No Smoking or e-cigarettes for 24 prior to surgery.  Do not use any chewable tobacco products for at least 6 hour prior to surgery   ____  4. Bring all medications with you on the day of surgery if instructed.    __x__ 5. Notify your doctor if there is any change in your medical condition     (cold, fever, infections).    x___6. On the morning of surgery brush your teeth with toothpaste and water.  You may rinse your mouth with mouth wash if you wish.  Do not swallow any toothpaste or mouthwash.   Do not wear jewelry, make-up, hairpins, clips or nail polish.  Do not wear lotions, powders, or perfumes.  Do not shave 48 hours prior to surgery. Men may shave face and  neck.  Do not bring valuables to the hospital.    [facility] is not responsible for any belongings or valuables.               Contacts, dentures or bridgework may not be worn into surgery.  Leave your suitcase in the car. After surgery it may be brought to your room.  For patients admitted to the hospital, discharge time is determined by your  treatment team.  _  Patients discharged the day of surgery will not be allowed to drive home.  You will need someone to drive you home and stay with you the night of your procedure.    Please read over the following fact sheets that you were given:   [facility] Preparing for Surgery   _x___ TAKE THE FOLLOWING MEDICATION THE MORNING OF SURGERY WITH A SMALL SIP OF WATER. These include:  1. PRILOSEC (OMEPRAZOLE)  2. TOPAMAX (TOPIRAMATE)  3.  4.  5.  6.  ____Fleets enema or Magnesium Citrate as directed.   _x___ Use CHG Soap or sage wipes as directed on instruction sheet   ____ Use inhalers on the day of surgery and bring to hospital day of surgery  ____ Stop Metformin and Janumet 2 days prior to surgery.    ____ Take 1/2 of usual insulin dose the night before surgery and none on the morning surgery.   ____ Follow  recommendations from Cardiologist, Pulmonologist or PCP regarding stopping Aspirin, Coumadin, Plavix ,Eliquis, Effient, or Pradaxa, and Pletal.  X____Stop Anti-inflammatories such as Advil, Aleve, Ibuprofen, Motrin, Naproxen, Naprosyn, Goodies powders or aspirin products NOW-OK to take Tylenol OR TRAMADOL IF NEEDED    ____ Stop supplements until after surgery.     _X___ Bring C-Pap to the hospital.

## 2019-11-13 ENCOUNTER — Telehealth: Payer: Self-pay

## 2019-11-13 NOTE — Telephone Encounter (Signed)
Message left for the patient to call back. She had some questions regarding her surgery scheduled for 11/19/19 with Dr Claudine Mouton.

## 2019-11-13 NOTE — Telephone Encounter (Signed)
Spoke with the patient and let her know about what the lighted stents were and why they are used. She is agreeable and all questions were answered.

## 2019-11-14 ENCOUNTER — Other Ambulatory Visit: Payer: Self-pay | Admitting: Infectious Diseases

## 2019-11-14 DIAGNOSIS — A411 Sepsis due to other specified staphylococcus: Secondary | ICD-10-CM

## 2019-11-17 ENCOUNTER — Other Ambulatory Visit: Payer: Self-pay

## 2019-11-17 ENCOUNTER — Other Ambulatory Visit
Admission: RE | Admit: 2019-11-17 | Discharge: 2019-11-17 | Disposition: A | Discharge status: Home / Self Care | Payer: BC Managed Care – PPO | Source: Ambulatory Visit | Attending: Surgery | Admitting: Surgery

## 2019-11-17 DIAGNOSIS — Z20822 Contact with and (suspected) exposure to covid-19: Secondary | ICD-10-CM | POA: Insufficient documentation

## 2019-11-17 DIAGNOSIS — Z01812 Encounter for preprocedural laboratory examination: Secondary | ICD-10-CM | POA: Insufficient documentation

## 2019-11-17 LAB — HEMOGLOBIN A1C
Hgb A1c MFr Bld: 4.7 % — ABNORMAL LOW (ref 4.8–5.6)
Mean Plasma Glucose: 88.19 mg/dL

## 2019-11-17 LAB — SARS CORONAVIRUS 2 (TAT 6-24 HRS): SARS Coronavirus 2: NEGATIVE

## 2019-11-18 MED ORDER — SODIUM CHLORIDE 0.9 % IV SOLN
2.0000 g | INTRAVENOUS | Status: AC
  Administered 2019-11-19: 2 g via INTRAVENOUS
  Filled 2019-11-18: qty 2

## 2019-11-19 ENCOUNTER — Inpatient Hospital Stay
Admission: RE | Admit: 2019-11-19 | Discharge: 2019-11-24 | DRG: 331 | Disposition: A | Discharge status: Home / Self Care | Payer: BC Managed Care – PPO | Attending: Surgery | Admitting: Surgery

## 2019-11-19 ENCOUNTER — Encounter
Admission: RE | Disposition: A | Discharge status: Home / Self Care | Payer: Self-pay | Source: Home / Self Care | Attending: Surgery

## 2019-11-19 ENCOUNTER — Inpatient Hospital Stay: Payer: BC Managed Care – PPO | Admitting: Anesthesiology

## 2019-11-19 ENCOUNTER — Other Ambulatory Visit: Payer: Self-pay

## 2019-11-19 ENCOUNTER — Encounter: Payer: Self-pay | Admitting: Surgery

## 2019-11-19 ENCOUNTER — Inpatient Hospital Stay: Payer: BC Managed Care – PPO

## 2019-11-19 DIAGNOSIS — L4050 Arthropathic psoriasis, unspecified: Secondary | ICD-10-CM | POA: Diagnosis present

## 2019-11-19 DIAGNOSIS — Z79899 Other long term (current) drug therapy: Secondary | ICD-10-CM | POA: Diagnosis not present

## 2019-11-19 DIAGNOSIS — R1032 Left lower quadrant pain: Secondary | ICD-10-CM | POA: Diagnosis present

## 2019-11-19 DIAGNOSIS — Z886 Allergy status to analgesic agent status: Secondary | ICD-10-CM

## 2019-11-19 DIAGNOSIS — Z9109 Other allergy status, other than to drugs and biological substances: Secondary | ICD-10-CM | POA: Diagnosis not present

## 2019-11-19 DIAGNOSIS — G8918 Other acute postprocedural pain: Secondary | ICD-10-CM | POA: Diagnosis not present

## 2019-11-19 DIAGNOSIS — K5732 Diverticulitis of large intestine without perforation or abscess without bleeding: Secondary | ICD-10-CM | POA: Diagnosis not present

## 2019-11-19 DIAGNOSIS — K5720 Diverticulitis of large intestine with perforation and abscess without bleeding: Secondary | ICD-10-CM

## 2019-11-19 DIAGNOSIS — Z888 Allergy status to other drugs, medicaments and biological substances status: Secondary | ICD-10-CM | POA: Diagnosis not present

## 2019-11-19 DIAGNOSIS — I10 Essential (primary) hypertension: Secondary | ICD-10-CM | POA: Diagnosis present

## 2019-11-19 DIAGNOSIS — Z20822 Contact with and (suspected) exposure to covid-19: Secondary | ICD-10-CM | POA: Diagnosis present

## 2019-11-19 DIAGNOSIS — R519 Headache, unspecified: Secondary | ICD-10-CM | POA: Diagnosis present

## 2019-11-19 HISTORY — PX: CYSTOSCOPY: SHX5120

## 2019-11-19 LAB — BASIC METABOLIC PANEL
Anion gap: 9 (ref 5–15)
BUN: 11 mg/dL (ref 6–20)
CO2: 21 mmol/L — ABNORMAL LOW (ref 22–32)
Calcium: 7.5 mg/dL — ABNORMAL LOW (ref 8.9–10.3)
Chloride: 109 mmol/L (ref 98–111)
Creatinine, Ser: 1.42 mg/dL — ABNORMAL HIGH (ref 0.44–1.00)
GFR calc Af Amer: 52 mL/min — ABNORMAL LOW (ref >60)
GFR calc non Af Amer: 44 mL/min — ABNORMAL LOW (ref >60)
Glucose, Bld: 135 mg/dL — ABNORMAL HIGH (ref 70–99)
Potassium: 4.2 mmol/L (ref 3.5–5.1)
Sodium: 139 mmol/L (ref 135–145)

## 2019-11-19 LAB — POCT PREGNANCY, URINE: Preg Test, Ur: NEGATIVE

## 2019-11-19 SURGERY — COLECTOMY, PARTIAL, ROBOT-ASSISTED, LAPAROSCOPIC
Anesthesia: General

## 2019-11-19 MED ORDER — DEXMEDETOMIDINE HCL 200 MCG/2ML IV SOLN
INTRAVENOUS | Status: DC | PRN
  Administered 2019-11-19 (×4): 4 ug via INTRAVENOUS

## 2019-11-19 MED ORDER — PHENYLEPHRINE HCL (PRESSORS) 10 MG/ML IV SOLN
INTRAVENOUS | Status: DC | PRN
  Administered 2019-11-19 (×7): 100 ug via INTRAVENOUS
  Administered 2019-11-19: 200 ug via INTRAVENOUS
  Administered 2019-11-19 (×3): 100 ug via INTRAVENOUS

## 2019-11-19 MED ORDER — MIDAZOLAM HCL 2 MG/2ML IJ SOLN
INTRAMUSCULAR | Status: AC
  Filled 2019-11-19: qty 2

## 2019-11-19 MED ORDER — PROPOFOL 10 MG/ML IV BOLUS
INTRAVENOUS | Status: DC | PRN
  Administered 2019-11-19: 130 mg via INTRAVENOUS

## 2019-11-19 MED ORDER — SEVOFLURANE IN SOLN
RESPIRATORY_TRACT | Status: AC
  Filled 2019-11-19: qty 250

## 2019-11-19 MED ORDER — SODIUM CHLORIDE FLUSH 0.9 % IV SOLN
INTRAVENOUS | Status: AC
  Filled 2019-11-19: qty 3

## 2019-11-19 MED ORDER — SODIUM CHLORIDE 0.9 % IV SOLN: INTRAVENOUS | Status: DC | PRN

## 2019-11-19 MED ORDER — BUPIVACAINE HCL (PF) 0.25 % IJ SOLN
INTRAMUSCULAR | Status: AC
  Filled 2019-11-19: qty 30

## 2019-11-19 MED ORDER — ROCURONIUM BROMIDE 100 MG/10ML IV SOLN
INTRAVENOUS | Status: DC | PRN
  Administered 2019-11-19: 20 mg via INTRAVENOUS
  Administered 2019-11-19: 60 mg via INTRAVENOUS
  Administered 2019-11-19: 20 mg via INTRAVENOUS
  Administered 2019-11-19: 40 mg via INTRAVENOUS
  Administered 2019-11-19: 30 mg via INTRAVENOUS

## 2019-11-19 MED ORDER — ACETAMINOPHEN 10 MG/ML IV SOLN
INTRAVENOUS | Status: DC | PRN
  Administered 2019-11-19: 1000 mg via INTRAVENOUS

## 2019-11-19 MED ORDER — CHLORHEXIDINE GLUCONATE CLOTH 2 % EX PADS
6.0000 | MEDICATED_PAD | Freq: Every day | CUTANEOUS | Status: DC
  Administered 2019-11-22 – 2019-11-24 (×3): 6 via TOPICAL

## 2019-11-19 MED ORDER — PROMETHAZINE HCL 25 MG/ML IJ SOLN
12.5000 mg | Freq: Once | INTRAMUSCULAR | Status: AC | PRN
  Administered 2019-11-19: 12.5 mg via INTRAMUSCULAR

## 2019-11-19 MED ORDER — FENTANYL CITRATE (PF) 100 MCG/2ML IJ SOLN
INTRAMUSCULAR | Status: AC
  Filled 2019-11-19: qty 2

## 2019-11-19 MED ORDER — ACETAMINOPHEN 10 MG/ML IV SOLN: 1000.0000 mg | Freq: Once | INTRAVENOUS | Status: DC | PRN

## 2019-11-19 MED ORDER — HYDROCODONE-ACETAMINOPHEN 5-325 MG PO TABS
1.0000 | ORAL_TABLET | ORAL | Status: DC | PRN
  Administered 2019-11-20 – 2019-11-21 (×6): 2 via ORAL
  Administered 2019-11-21: 1 via ORAL
  Administered 2019-11-22 (×2): 2 via ORAL
  Administered 2019-11-22: 1 via ORAL
  Administered 2019-11-22 – 2019-11-23 (×2): 2 via ORAL
  Administered 2019-11-23 (×3): 1 via ORAL
  Filled 2019-11-19 (×9): qty 2
  Filled 2019-11-19: qty 1
  Filled 2019-11-19 (×4): qty 2
  Filled 2019-11-19: qty 1
  Filled 2019-11-19 (×2): qty 2

## 2019-11-19 MED ORDER — CHLORHEXIDINE GLUCONATE CLOTH 2 % EX PADS
6.0000 | MEDICATED_PAD | Freq: Once | CUTANEOUS | Status: AC
  Administered 2019-11-19: 6 via TOPICAL

## 2019-11-19 MED ORDER — ALBUMIN HUMAN 5 % IV SOLN
INTRAVENOUS | Status: AC
  Filled 2019-11-19: qty 250

## 2019-11-19 MED ORDER — ZOLPIDEM TARTRATE 5 MG PO TABS
5.0000 mg | ORAL_TABLET | Freq: Every evening | ORAL | Status: DC | PRN
  Administered 2019-11-24: 5 mg via ORAL
  Filled 2019-11-19: qty 1

## 2019-11-19 MED ORDER — ACETAMINOPHEN 500 MG PO TABS: 1000.0000 mg | ORAL_TABLET | ORAL | Status: AC

## 2019-11-19 MED ORDER — PANTOPRAZOLE SODIUM 40 MG IV SOLR
40.0000 mg | Freq: Every day | INTRAVENOUS | Status: DC
  Administered 2019-11-20 – 2019-11-22 (×4): 40 mg via INTRAVENOUS
  Filled 2019-11-19 (×4): qty 40

## 2019-11-19 MED ORDER — ACETAMINOPHEN 500 MG PO TABS
ORAL_TABLET | ORAL | Status: AC
  Administered 2019-11-19: 1000 mg via ORAL
  Filled 2019-11-19: qty 2

## 2019-11-19 MED ORDER — GABAPENTIN 300 MG PO CAPS: 300.0000 mg | ORAL_CAPSULE | ORAL | Status: AC

## 2019-11-19 MED ORDER — ONDANSETRON HCL 4 MG/2ML IJ SOLN: 4.0000 mg | Freq: Once | INTRAMUSCULAR | Status: DC | PRN

## 2019-11-19 MED ORDER — ONDANSETRON 4 MG PO TBDP: 4.0000 mg | ORAL_TABLET | Freq: Four times a day (QID) | ORAL | Status: DC | PRN

## 2019-11-19 MED ORDER — INDOCYANINE GREEN 25 MG IV SOLR
INTRAVENOUS | Status: DC | PRN
  Administered 2019-11-19 (×4): 2.5 mg via INTRAVENOUS

## 2019-11-19 MED ORDER — BUPIVACAINE LIPOSOME 1.3 % IJ SUSP
INTRAMUSCULAR | Status: AC
  Filled 2019-11-19: qty 20

## 2019-11-19 MED ORDER — FENTANYL CITRATE (PF) 100 MCG/2ML IJ SOLN
25.0000 ug | INTRAMUSCULAR | Status: DC | PRN
  Administered 2019-11-19 (×2): 25 ug via INTRAVENOUS

## 2019-11-19 MED ORDER — HYDROMORPHONE HCL 1 MG/ML IJ SOLN
INTRAMUSCULAR | Status: DC | PRN
  Administered 2019-11-19 (×2): .5 mg via INTRAVENOUS

## 2019-11-19 MED ORDER — BUPIVACAINE LIPOSOME 1.3 % IJ SUSP: 20.0000 mL | Freq: Once | INTRAMUSCULAR | Status: DC

## 2019-11-19 MED ORDER — ONDANSETRON HCL 4 MG/2ML IJ SOLN
INTRAMUSCULAR | Status: DC | PRN
  Administered 2019-11-19 (×2): 4 mg via INTRAVENOUS

## 2019-11-19 MED ORDER — SODIUM CHLORIDE 0.9 % IV SOLN
2.0000 g | Freq: Once | INTRAVENOUS | Status: AC
  Administered 2019-11-19: 2 g via INTRAVENOUS
  Filled 2019-11-19: qty 2

## 2019-11-19 MED ORDER — ALVIMOPAN 12 MG PO CAPS: 12.0000 mg | ORAL_CAPSULE | ORAL | Status: AC

## 2019-11-19 MED ORDER — DEXAMETHASONE SODIUM PHOSPHATE 10 MG/ML IJ SOLN
INTRAMUSCULAR | Status: DC | PRN
  Administered 2019-11-19: 10 mg via INTRAVENOUS

## 2019-11-19 MED ORDER — LIDOCAINE HCL (CARDIAC) PF 100 MG/5ML IV SOSY
PREFILLED_SYRINGE | INTRAVENOUS | Status: DC | PRN
  Administered 2019-11-19: 70 mg via INTRAVENOUS

## 2019-11-19 MED ORDER — ENOXAPARIN SODIUM 30 MG/0.3ML ~~LOC~~ SOLN: 30.0000 mg | SUBCUTANEOUS | Status: DC

## 2019-11-19 MED ORDER — LACTATED RINGERS IV SOLN
INTRAVENOUS | Status: DC
  Administered 2019-11-19: 50 mL/h via INTRAVENOUS

## 2019-11-19 MED ORDER — ONDANSETRON HCL 4 MG/2ML IJ SOLN
INTRAMUSCULAR | Status: AC
  Filled 2019-11-19: qty 2

## 2019-11-19 MED ORDER — GABAPENTIN 300 MG PO CAPS
300.0000 mg | ORAL_CAPSULE | Freq: Two times a day (BID) | ORAL | Status: DC
  Administered 2019-11-20 – 2019-11-24 (×10): 300 mg via ORAL
  Filled 2019-11-19 (×10): qty 1

## 2019-11-19 MED ORDER — ACETAMINOPHEN 500 MG PO TABS
1000.0000 mg | ORAL_TABLET | Freq: Four times a day (QID) | ORAL | Status: DC
  Administered 2019-11-20 – 2019-11-21 (×4): 1000 mg via ORAL
  Administered 2019-11-22: 500 mg via ORAL
  Administered 2019-11-23 – 2019-11-24 (×3): 1000 mg via ORAL
  Filled 2019-11-19 (×13): qty 2

## 2019-11-19 MED ORDER — BUPIVACAINE-EPINEPHRINE (PF) 0.25% -1:200000 IJ SOLN
INTRAMUSCULAR | Status: DC | PRN
  Administered 2019-11-19: 30 mL

## 2019-11-19 MED ORDER — ACETAMINOPHEN 10 MG/ML IV SOLN
INTRAVENOUS | Status: AC
  Filled 2019-11-19: qty 100

## 2019-11-19 MED ORDER — SCOPOLAMINE 1 MG/3DAYS TD PT72
MEDICATED_PATCH | TRANSDERMAL | Status: AC
  Filled 2019-11-19: qty 1

## 2019-11-19 MED ORDER — GABAPENTIN 300 MG PO CAPS
ORAL_CAPSULE | ORAL | Status: AC
  Administered 2019-11-19: 300 mg via ORAL
  Filled 2019-11-19: qty 1

## 2019-11-19 MED ORDER — ALVIMOPAN 12 MG PO CAPS
ORAL_CAPSULE | ORAL | Status: AC
  Administered 2019-11-19: 12 mg via ORAL
  Filled 2019-11-19: qty 1

## 2019-11-19 MED ORDER — HYDROMORPHONE HCL 1 MG/ML IJ SOLN
1.0000 mg | INTRAMUSCULAR | Status: DC | PRN
  Administered 2019-11-19 – 2019-11-22 (×6): 1 mg via INTRAVENOUS
  Filled 2019-11-19 (×6): qty 1

## 2019-11-19 MED ORDER — OXYCODONE HCL 5 MG/5ML PO SOLN: 5.0000 mg | Freq: Once | ORAL | Status: DC | PRN

## 2019-11-19 MED ORDER — ONDANSETRON HCL 4 MG/2ML IJ SOLN
4.0000 mg | Freq: Four times a day (QID) | INTRAMUSCULAR | Status: DC | PRN
  Administered 2019-11-20 (×2): 4 mg via INTRAVENOUS
  Filled 2019-11-19 (×2): qty 2

## 2019-11-19 MED ORDER — LACTATED RINGERS IV SOLN: INTRAVENOUS | Status: DC

## 2019-11-19 MED ORDER — EPINEPHRINE PF 1 MG/ML IJ SOLN
INTRAMUSCULAR | Status: AC
  Filled 2019-11-19: qty 1

## 2019-11-19 MED ORDER — ALBUMIN HUMAN 5 % IV SOLN: INTRAVENOUS | Status: DC | PRN

## 2019-11-19 MED ORDER — CHLORHEXIDINE GLUCONATE CLOTH 2 % EX PADS
6.0000 | MEDICATED_PAD | Freq: Once | CUTANEOUS | Status: AC
  Administered 2019-11-19: 08:00:00 6 via TOPICAL

## 2019-11-19 MED ORDER — ALVIMOPAN 12 MG PO CAPS
12.0000 mg | ORAL_CAPSULE | Freq: Two times a day (BID) | ORAL | Status: DC
  Administered 2019-11-20 – 2019-11-21 (×3): 12 mg via ORAL
  Filled 2019-11-19 (×4): qty 1

## 2019-11-19 MED ORDER — PROPOFOL 10 MG/ML IV BOLUS
INTRAVENOUS | Status: AC
  Filled 2019-11-19: qty 40

## 2019-11-19 MED ORDER — HYDROMORPHONE HCL 1 MG/ML IJ SOLN
INTRAMUSCULAR | Status: AC
  Filled 2019-11-19: qty 1

## 2019-11-19 MED ORDER — SUGAMMADEX SODIUM 200 MG/2ML IV SOLN
INTRAVENOUS | Status: DC | PRN
  Administered 2019-11-19: 200 mg via INTRAVENOUS

## 2019-11-19 MED ORDER — INDOCYANINE GREEN 25 MG IV SOLR
INTRAVENOUS | Status: DC | PRN
  Administered 2019-11-19: 10 mg

## 2019-11-19 MED ORDER — ROCURONIUM BROMIDE 10 MG/ML (PF) SYRINGE
PREFILLED_SYRINGE | INTRAVENOUS | Status: AC
  Filled 2019-11-19: qty 10

## 2019-11-19 MED ORDER — OXYCODONE HCL 5 MG PO TABS: 5.0000 mg | ORAL_TABLET | Freq: Once | ORAL | Status: DC | PRN

## 2019-11-19 MED ORDER — SCOPOLAMINE 1 MG/3DAYS TD PT72
1.0000 | MEDICATED_PATCH | TRANSDERMAL | Status: DC
  Administered 2019-11-19 – 2019-11-22 (×2): 1.5 mg via TRANSDERMAL
  Filled 2019-11-19: qty 1

## 2019-11-19 MED ORDER — FENTANYL CITRATE (PF) 100 MCG/2ML IJ SOLN
INTRAMUSCULAR | Status: DC | PRN
  Administered 2019-11-19: 50 ug via INTRAVENOUS
  Administered 2019-11-19: 25 ug via INTRAVENOUS
  Administered 2019-11-19: 100 ug via INTRAVENOUS
  Administered 2019-11-19: 25 ug via INTRAVENOUS
  Administered 2019-11-19: 50 ug via INTRAVENOUS
  Administered 2019-11-19 (×2): 25 ug via INTRAVENOUS

## 2019-11-19 SURGICAL SUPPLY — 122 items
ADAPTER CATH URET CONN 4-6FR (ADAPTER) IMPLANT
ADAPTER GOLDBERG URETERAL (ADAPTER) IMPLANT
ANCHOR TIS RET SYS 1550ML (BAG) IMPLANT
BAG BILE T-TUBES STRL (MISCELLANEOUS) IMPLANT
BAG DRAIN CYSTO-URO LG1000N (MISCELLANEOUS) ×5 IMPLANT
BAG URINE DRAIN 2000ML AR STRL (UROLOGICAL SUPPLIES) ×5 IMPLANT
BAG URO DRAIN 2000ML W/SPOUT (MISCELLANEOUS) ×5 IMPLANT
BLADE SURG SZ10 CARB STEEL (BLADE) ×5 IMPLANT
BRUSH SCRUB EZ  4% CHG (MISCELLANEOUS) ×3
BRUSH SCRUB EZ 4% CHG (MISCELLANEOUS) ×3 IMPLANT
CANISTER SUCT 1200ML W/VALVE (MISCELLANEOUS) ×5 IMPLANT
CANNULA REDUC XI 12-8 STAPL (CANNULA) ×2
CANNULA REDUC XI 12-8MM STAPL (CANNULA) ×1
CANNULA REDUCER 12-8 DVNC XI (CANNULA) ×3 IMPLANT
CATH FOLEY 2WAY  5CC 16FR (CATHETERS)
CATH FOLEY SIL 2WAY 14FR5CC (CATHETERS) IMPLANT
CATH ROBINSON RED A/P 20FR (CATHETERS) ×5 IMPLANT
CATH URETL 5X70 OPEN END (CATHETERS) ×5 IMPLANT
CATH URTH 16FR FL 2W BLN LF (CATHETERS) IMPLANT
CHLORAPREP W/TINT 26 (MISCELLANEOUS) ×5 IMPLANT
COVER TIP SHEARS 8 DVNC (MISCELLANEOUS) ×3 IMPLANT
COVER TIP SHEARS 8MM DA VINCI (MISCELLANEOUS) ×3
COVER WAND RF STERILE (DRAPES) ×5 IMPLANT
DECANTER SPIKE VIAL GLASS SM (MISCELLANEOUS) ×5 IMPLANT
DEFOGGER SCOPE WARMER CLEARIFY (MISCELLANEOUS) ×10 IMPLANT
DERMABOND ADVANCED (GAUZE/BANDAGES/DRESSINGS) ×2
DERMABOND ADVANCED .7 DNX12 (GAUZE/BANDAGES/DRESSINGS) ×3 IMPLANT
DRAPE ARM DVNC X/XI (DISPOSABLE) ×12 IMPLANT
DRAPE COLUMN DVNC XI (DISPOSABLE) ×3 IMPLANT
DRAPE DA VINCI XI ARM (DISPOSABLE) ×16
DRAPE DA VINCI XI COLUMN (DISPOSABLE) ×4
DRAPE LEGGINS SURG 28X43 STRL (DRAPES) ×5 IMPLANT
DRAPE UNDER BUTTOCK W/FLU (DRAPES) ×5 IMPLANT
EEA AUTO SUTURE ×5 IMPLANT
ELECT REM PT RETURN 9FT ADLT (ELECTROSURGICAL) ×5
ELECTRODE REM PT RTRN 9FT ADLT (ELECTROSURGICAL) ×3 IMPLANT
GELPOINT ADV PLATFORM (ENDOMECHANICALS) ×5
GLOVE BIO SURGEON STRL SZ 6.5 (GLOVE) ×4 IMPLANT
GLOVE BIO SURGEONS STRL SZ 6.5 (GLOVE) ×1
GLOVE ORTHO TXT STRL SZ7.5 (GLOVE) ×20 IMPLANT
GOWN STRL REUS W/ TWL LRG LVL3 (GOWN DISPOSABLE) ×21 IMPLANT
GOWN STRL REUS W/TWL LRG LVL3 (GOWN DISPOSABLE) ×21
GRASPER SUT TROCAR 14GX15 (MISCELLANEOUS) ×5 IMPLANT
GUIDEWIRE STR DUAL SENSOR (WIRE) IMPLANT
HANDLE YANKAUER SUCT BULB TIP (MISCELLANEOUS) ×5 IMPLANT
IRRIGATION STRYKERFLOW (MISCELLANEOUS) ×3 IMPLANT
IRRIGATOR STRYKERFLOW (MISCELLANEOUS) ×5
IV NS IRRIG 3000ML ARTHROMATIC (IV SOLUTION) IMPLANT
KIT IMAGING PINPOINTPAQ (MISCELLANEOUS) ×10 IMPLANT
KIT PINK PAD W/HEAD ARE REST (MISCELLANEOUS) ×5
KIT PINK PAD W/HEAD ARM REST (MISCELLANEOUS) ×3 IMPLANT
KIT TURNOVER CYSTO (KITS) ×5 IMPLANT
KIT TURNOVER KIT A (KITS) ×5 IMPLANT
NEEDLE HYPO 18GX1.5 BLUNT FILL (NEEDLE) ×5 IMPLANT
NEEDLE HYPO 22GX1.5 SAFETY (NEEDLE) ×5 IMPLANT
NEEDLE INSUFFLATION 14GA 120MM (NEEDLE) ×5 IMPLANT
NS IRRIG 500ML POUR BTL (IV SOLUTION) ×5 IMPLANT
PACK COLON CLEAN CLOSURE (MISCELLANEOUS) IMPLANT
PACK CYSTO AR (MISCELLANEOUS) ×5 IMPLANT
PACK LAP CHOLECYSTECTOMY (MISCELLANEOUS) ×5 IMPLANT
PAD PREP 24X41 OB/GYN DISP (PERSONAL CARE ITEMS) ×5 IMPLANT
PLATFORM STD W/COL CELL SVR (ENDOMECHANICALS) ×3 IMPLANT
PORT ACCESS TROCAR AIRSEAL 12 (TROCAR) ×3 IMPLANT
PORT ACCESS TROCAR AIRSEAL 5M (TROCAR) ×2
RELOAD STAPLER 3.5X45 BLU DVNC (STAPLE) IMPLANT
RELOAD STAPLER 3.5X60 BLU DVNC (STAPLE) IMPLANT
RETRACTOR RING XSMALL (MISCELLANEOUS) ×6 IMPLANT
RETRACTOR WOUND ALXS 18CM MED (MISCELLANEOUS) IMPLANT
RTRCTR WOUND ALEXIS 13CM XS SH (MISCELLANEOUS) ×10
RTRCTR WOUND ALEXIS O 18CM MED (MISCELLANEOUS)
SCISSORS METZENBAUM CVD 33 (INSTRUMENTS) IMPLANT
SEAL CANN UNIV 5-8 DVNC XI (MISCELLANEOUS) ×9 IMPLANT
SEAL XI 5MM-8MM UNIVERSAL (MISCELLANEOUS) ×9
SEALER VESSEL DA VINCI XI (MISCELLANEOUS) ×3
SEALER VESSEL EXT DVNC XI (MISCELLANEOUS) ×3 IMPLANT
SET CYSTO W/LG BORE CLAMP LF (SET/KITS/TRAYS/PACK) ×5 IMPLANT
SET TRI-LUMEN FLTR TB AIRSEAL (TUBING) ×5 IMPLANT
SIDE-ARM FITTING (MISCELLANEOUS) IMPLANT
SLEEVE PROTECTION STRL DISP (MISCELLANEOUS) ×10 IMPLANT
SOL .9 NS 3000ML IRR  AL (IV SOLUTION) ×4
SOL .9 NS 3000ML IRR UROMATIC (IV SOLUTION) ×3 IMPLANT
SOL PREP PVP 2OZ (MISCELLANEOUS) ×5
SOLUTION ELECTROLUBE (MISCELLANEOUS) ×5 IMPLANT
SOLUTION PREP PVP 2OZ (MISCELLANEOUS) ×3 IMPLANT
SPONGE LAP 4X18 RFD (DISPOSABLE) ×5 IMPLANT
STAPLER 45 DA VINCI SURE FORM (STAPLE)
STAPLER 45 SUREFORM DVNC (STAPLE) IMPLANT
STAPLER 60 DA VINCI SURE FORM (STAPLE)
STAPLER 60 SUREFORM DVNC (STAPLE) IMPLANT
STAPLER CANNULA SEAL DVNC XI (STAPLE) ×3 IMPLANT
STAPLER CANNULA SEAL XI (STAPLE) ×4
STAPLER RELOAD 3.5X45 BLU DVNC (STAPLE)
STAPLER RELOAD 3.5X45 BLUE (STAPLE)
STAPLER RELOAD 3.5X60 BLU DVNC (STAPLE)
STAPLER RELOAD 3.5X60 BLUE (STAPLE)
STAPLER SKIN PROX 35W (STAPLE) IMPLANT
STENT URET 6FRX24 CONTOUR (STENTS) IMPLANT
STENT URET 6FRX26 CONTOUR (STENTS) IMPLANT
SURGILUBE 2OZ TUBE FLIPTOP (MISCELLANEOUS) ×10 IMPLANT
SUT DVC VLOC 90 3-0 CV23 VLT (SUTURE) ×35
SUT MNCRL 4-0 (SUTURE) ×6
SUT MNCRL 4-0 27XMFL (SUTURE) ×6
SUT SILK 0 (SUTURE)
SUT SILK 0 30XBRD TIE 6 (SUTURE) IMPLANT
SUT VIC AB 0 CT1 36 (SUTURE) ×5 IMPLANT
SUT VIC AB 3-0 SH 27 (SUTURE)
SUT VIC AB 3-0 SH 27X BRD (SUTURE) IMPLANT
SUT VICRYL 0 AB UR-6 (SUTURE) ×5 IMPLANT
SUTURE DVC VLC 90 3-0 CV23 VLT (SUTURE) ×21 IMPLANT
SUTURE MNCRL 4-0 27XMF (SUTURE) ×6 IMPLANT
SYR 50ML LL SCALE MARK (SYRINGE) ×5 IMPLANT
SYR TOOMEY IRRIG 70ML (MISCELLANEOUS) ×5
SYRINGE IRR TOOMEY STRL 70CC (SYRINGE) ×5 IMPLANT
SYRINGE TOOMEY IRRIG 70ML (MISCELLANEOUS) ×3 IMPLANT
TAPE TRANSPORE STRL 2 31045 (GAUZE/BANDAGES/DRESSINGS) ×5 IMPLANT
TOWEL OR 17X26 4PK STRL BLUE (TOWEL DISPOSABLE) ×5 IMPLANT
TRAY FOLEY MTR SLVR 16FR STAT (SET/KITS/TRAYS/PACK) ×5 IMPLANT
TROCAR Z-THREAD FIOS 11X100 BL (TROCAR) ×5 IMPLANT
TROCAR Z-THREAD OPTICAL 5X100M (TROCAR) IMPLANT
TUBING ART PRESS 48 MALE/FEM (TUBING) IMPLANT
TUBING EVAC SMOKE HEATED PNEUM (TUBING) ×5 IMPLANT
WATER STERILE IRR 1000ML POUR (IV SOLUTION) ×5 IMPLANT

## 2019-11-19 NOTE — Anesthesia Procedure Notes (Signed)
Procedure Name: Intubation Date/Time: 11/19/2019 8:52 AM Performed by: Manning Charity, CRNA Pre-anesthesia Checklist: Patient identified, Emergency Drugs available, Suction available and Patient being monitored Patient Re-evaluated:Patient Re-evaluated prior to induction Oxygen Delivery Method: Circle system utilized Preoxygenation: Pre-oxygenation with 100% oxygen Induction Type: IV induction Ventilation: Mask ventilation without difficulty Laryngoscope Size: McGraph and 3 Grade View: Grade II Tube type: Oral Tube size: 7.0 mm Airway Equipment and Method: Stylet Placement Confirmation: ETT inserted through vocal cords under direct vision and positive ETCO2 Secured at: 21 cm Tube secured with: Tape Dental Injury: Teeth and Oropharynx as per pre-operative assessment

## 2019-11-19 NOTE — Interval H&P Note (Signed)
History and Physical Interval Note:  11/19/2019 8:10 AM  Wilford Sports  has presented today for surgery, with the diagnosis of Diverticulitis (complicated).  The various methods of treatment have been discussed with the patient and family. After consideration of risks, benefits and other options for treatment, the patient has consented to  Procedure(s) with comments: XI ROBOT ASSISTED Sigmoid Resection (N/A) - LEFT SIDED STENT CYSTOSCOPY URETERAL STENT PLACEMENT (Left) as a surgical intervention.  The patient's history has been reviewed, patient examined, no change in status, stable for surgery.  I have reviewed the patient's chart and labs.  Questions were answered to the patient's satisfaction.    Discussed ureteral stenting including risk of UTI, blood in the urine, ureteral edema or injury amongst others.  All questions answered.  RRR CTAB  [name]

## 2019-11-19 NOTE — Interval H&P Note (Signed)
History and Physical Interval Note:  11/19/2019 8:46 AM  Sara Chapman  has presented today for surgery, with the diagnosis of Diverticulitis (complicated).  The various methods of treatment have been discussed with the patient and family. After consideration of risks, benefits and other options for treatment, the patient has consented to  Procedure(s) with comments: XI ROBOT ASSISTED Sigmoid Resection (N/A) - LEFT SIDED STENT CYSTOSCOPY URETERAL STENT PLACEMENT (Left) as a surgical intervention.  The patient's history has been reviewed, patient examined, no change in status, stable for surgery.  I have reviewed the patient's chart and labs.  Questions were answered to the patient's satisfaction.     Campbell Lerner

## 2019-11-19 NOTE — Anesthesia Preprocedure Evaluation (Signed)
Anesthesia Evaluation  Patient identified by MRN, date of birth, ID band Patient awake    Reviewed: Allergy & Precautions, NPO status , Patient's Chart, lab work & pertinent test results  History of Anesthesia Complications (+) PONV and history of anesthetic complications  Airway Mallampati: II  TM Distance: >3 FB Neck ROM: Full    Dental no notable dental hx. (+) Teeth Intact, Dental Advisory Given   Pulmonary sleep apnea and Continuous Positive Airway Pressure Ventilation , neg COPD, Patient abstained from smoking.Not current smoker,    Pulmonary exam normal breath sounds clear to auscultation       Cardiovascular Exercise Tolerance: Good METShypertension, (-) CAD and (-) Past MI (-) dysrhythmias  Rhythm:Regular Rate:Normal - Systolic murmurs    Neuro/Psych  Headaches, PSYCHIATRIC DISORDERS Anxiety    GI/Hepatic PUD, GERD  Medicated and Controlled,(+)     (-) substance abuse  ,   Endo/Other  neg diabetes  Renal/GU negative Renal ROS     Musculoskeletal  (+) Arthritis ,   Abdominal   Peds  Hematology   Anesthesia Other Findings Past Medical History: No date: Anemia     Comment:  H/O No date: Anxiety No date: Bowel perforation (HCC) No date: Complication of anesthesia     Comment:  WOKE UP DURING PLACEMENT OF JP DRAIN AND WAS IN SEVERE               PAIN FEELING DRAIN PLACEMENT No date: GERD (gastroesophageal reflux disease) No date: Headache     Comment:  MIGRAINES No date: History of kidney stones     Comment:  X1 No date: Hypertension No date: PONV (postoperative nausea and vomiting)     Comment:  WITH C-SECTION No date: Psoriatic arthritis (HCC) No date: Sleep apnea     Comment:  CPAP No date: Stomach ulcer  Reproductive/Obstetrics                             Anesthesia Physical Anesthesia Plan  ASA: II  Anesthesia Plan: General   Post-op Pain Management:     Induction: Intravenous  PONV Risk Score and Plan: 4 or greater and Ondansetron, Dexamethasone, Midazolam and Scopolamine patch - Pre-op  Airway Management Planned: Oral ETT  Additional Equipment: None  Intra-op Plan:   Post-operative Plan: Extubation in OR  Informed Consent: I have reviewed the patients History and Physical, chart, labs and discussed the procedure including the risks, benefits and alternatives for the proposed anesthesia with the patient or authorized representative who has indicated his/her understanding and acceptance.     Dental advisory given  Plan Discussed with: CRNA and Surgeon  Anesthesia Plan Comments: (Discussed risks of anesthesia with patient, including PONV, sore throat, lip/dental damage. Rare risks discussed as well, such as cardiorespiratory and neurological sequelae. Patient understands.)        Anesthesia Quick Evaluation

## 2019-11-19 NOTE — Op Note (Signed)
Date of procedure: 11/19/19  Preoperative diagnosis:  1. Diverticulitis  Postoperative diagnosis:  1. Same as above  Procedure: 1. Left retrograde pyelogram 2. Instillation of renal pelvic/ureteral ICG on left  Surgeon: [name], MD  Anesthesia: General  Complications: None  Intraoperative findings: Normal left collecting system without tortuosity or hydroureteronephrosis.  No filling defects.  ICG instilled to the left ureter without difficulty.  EBL: Minimal  Specimens: None  Drains: 16 Fr foley   Indication: Chosen Garron is a 44 y.o. patient with complicated diverticulitis undergoing left hemicolectomy robotically.  Dr. Claudine Mouton has requested left ureteral ICG.  After reviewing the management options for treatment, he elected to proceed with the above surgical procedure(s). We have discussed the potential benefits and risks of the procedure, side effects of the proposed treatment, the likelihood of the patient achieving the goals of the procedure, and any potential problems that might occur during the procedure or recuperation. Informed consent has been obtained.  Description of procedure:  The patient was taken to the operating room and general anesthesia was induced.  The patient was placed in the dorsal lithotomy position, prepped and draped in the usual sterile fashion, and preoperative antibiotics were administered. A preoperative time-out was performed.   21 [place] scope was advanced per urethra into the bladder.  Bladder essentially was normal.  The attention was turned to the left ureteral orifice which was cannulated using a 5 [place] open-ended ureteral catheter just within the UO.  A gentle retrograde pyelogram was performed using contrast solution which revealed no filling defects or hydroureteronephrosis.  The ureter had no tortuosity or deviation.  A sensor wire was then placed up to level the kidney and a 5 [place] open-ended ureteral catheter was  advanced into the renal pelvis.  The wire was withdrawn.  25 mg (10 mL) of ICG was instilled into the renal pelvis and ureter using a drawback technique as the open-ended ureteral catheter was removed.  At the end, E flux of ICG material was appreciated.  The bladder was then drained.  A 16 French Foley catheter was in place.  Remainder the procedure was performed by Dr. Claudine Mouton.  Please see his dictation for further details.  [name], M.D.

## 2019-11-19 NOTE — Op Note (Signed)
Robotic sigmoid colectomy with coloproctostomy and splenic flexure mobilization.  Pre-operative Diagnosis: Descending and sigmoid diverticulitis.  Post-operative Diagnosis: same.   Surgeon: Campbell Lerner, M.D., FACS  Anesthesia: General   Findings:  Interloop small bowel adhesions to left colon/sigmoid colon.  Firefly imaging utilized to confirm adequate vascularity of bowel at resection sites, and anastomosis.  Estimated Blood Loss: 350 mL          Specimens: Sigmoid colon.  Sigmoid mesentery.  Multiple fragments due to transrectal extraction.          Complications: none              Procedure Details  The patient was seen again in the Holding Room. The benefits, complications, treatment options, and expected outcomes were discussed with the patient. The risks of bleeding, infection, recurrence of symptoms, failure to resolve symptoms, unanticipated injury, prosthetic placement, prosthetic infection, any of which could require further surgery were reviewed with the patient. The likelihood of improving the patient's symptoms with return to their baseline status is reasonable.  The patient and/or family concurred with the proposed plan, giving informed consent.  The patient was taken to Operating Room, identified and the procedure verified.    Prior to the induction of general anesthesia, antibiotic prophylaxis was administered. VTE prophylaxis was in place.  General anesthesia was then administered and tolerated well. After the induction, the patient was positioned in the lithotomy position and the cystoscopy was completed with injection of ICG into the left ureter per Dr. Apolinar Junes.  Then the perineum and abdomen was prepped with Chloraprep and draped in the sterile fashion.   A Time Out was held and the above information confirmed.  After local infiltration of quarter percent Marcaine with epinephrine, stab incision was made left upper quadrant.  Just below the costal margin  approximately midclavicular line the Veress needle is passed with sensation of the layers to penetrate the abdominal wall and into the peritoneum.  Saline drop test is confirmed peritoneal placement.  Insufflation is initiated with carbon dioxide to pressures of 15 mmHg.  I then marked the location for the 4 port sites starting 4 cm medial to the right anterior superior iliac spine, extending across the upper abdomen and to the left epigastrium with 6 cm intervals between robotic ports.  I placed an air seal 12 mm port under direct visualization in the right upper quadrant.  Local anesthetic of quarter percent Marcaine with epinephrine were utilized at all incision sites.  Once pneumoperitoneum obtained the deeper layers were injected under direct visualization. Under direct visualization for robotic 8.5 mm trochars are placed.  There were a few minimal filmy adhesions to the anterior abdominal wall these were sharply divided with laparoscopic scissors.  I then noted some small bowel adhesive process near the area of disease and I felt it was prudent to save this for the robot.  I did take down some of the lateral adhesive disease of the sigmoid colon to allow adequate mobilization.  Utilizing a tips up grasper, fenestrated bipolar and monopolar scissors proceeded with positioning the bowel by dividing the interloop adhesions of the small bowel to the area of disease.  I then placed the patient in 20 degrees Trendelenburg with a 10 degree tilt to the right side down.  Mobilizing the sigmoid colon putting in on 10 toes able to identify the plane between the mesorectum and the retroperitoneal fat.  Began our dissection there and obtained a posterior mesocolon dissection, and with firefly identified the  left ureter.  I continued the dissection cephalad and mobilized the IMA. I skeletonized the IMA and divided it with multiple seals on the vessel sealer.  I then continued the dissection of mesocolon from Gerota's  fascia cephalad and laterally.  I then entered the lesser sac after dividing the IMV.  I then completed the splenic flexure mobilization dividing the omental colic adhesions to the Leno colic and splenocolic adhesions, and proceeded with lateral mobilization. I then proceeded to mobilize the mesorectum tenting the rectosigmoid junction anteriorly identifying the dissection plane and continued with both blunt and sharp dissection keeping in mind the location of the left ureter.  Bilateral peritoneal pedicles were divided with the vessel sealer completing the mobilization of the rectum. I chose to divide the proximal rectum with the vessel sealer using short bursts of cautery and dividing it sharply. I then closed the open end of the colon with running 3 oh V-Loc suture.  I let then selected a point of proximal division.  I then divided the sigmoid mesentery to that point, confirming adequate vascularity with firefly.  The colon was sharply divided with the vessel sealer in like manner.  We then introduced the EEA anvil transanally and placed it within the distal colon utilizing a pursestring of 3 oh V-Loc suture to secure it. My assistant then placed a small Alexis retractor via the rectum, but we struggled with extraction due to the length of the rectum and the limit of our instruments. Then proceeded with excising an additional 4 to 5 cm of proximal rectum in the same manner utilizing the vessel sealer.  We then were able to place the [facility] retractor.  After multiple attempts to remove the specimen utilizing retrieval bag, etc. finally chose to piecemeal the sigmoid colon and its mesentery.  Therefore multiple divided specimens were able to be retrieved via the rectum. Once this was completed assistant then passed an EEA stapler 25 mm via the rectum to the rectal cuff opening where I then secured a pursestring of 3 oh V-Loc suture. We then completed our end-to-end anastomosis confirming that there was no  remarkable tension, excellent vascularity and without any twist of mesentery or colon. We then tested the anastomosis in 2 ways, utilizing dilute ICG seeing no fluid leak, and by instillation of air and confirming no air leak.  We then irrigated the abdominal cavity with 6 L of irrigation, aspirating at all nearly and confirming adequate hemostasis.  I did drain the left ovarian cyst, and sent a portion of the cyst wall with the specimen. We then undocked the robot, closed the 12 mm air seal port under direct vision with a figure of 8 of 0 Vicryl.  We then closed the skin with interrupted and running subcuticulars of 4-0 Monocryl and sealed all the incisions with Dermabond. Patient appeared to tolerate procedure well maintaining excellent hemo dynamics, however seem to have less than the expected urine output at the end of the procedure.      Ronny Bacon M.D., [facility] 11/19/2019 6:21 PM

## 2019-11-19 NOTE — Transfer of Care (Signed)
Immediate Anesthesia Transfer of Care Note  Patient: Sara Chapman  Procedure(s) Performed: XI ROBOT ASSISTED Sigmoid Resection (N/A ) CYSTOSCOPY (N/A ) INDOCYANINE GREEN FLUORESCENCE IMAGING (ICG)  Patient Location: PACU  Anesthesia Type:General  Level of Consciousness: sedated  Airway & Oxygen Therapy: Patient connected to face mask oxygen  Post-op Assessment: Post -op Vital signs reviewed and stable  Post vital signs: stable  Last Vitals:  Vitals Value Taken Time  BP 101/67 11/19/19 1742  Temp    Pulse 97 11/19/19 1745  Resp 12 11/19/19 1745  SpO2 99 % 11/19/19 1745  Vitals shown include unvalidated device data.  Last Pain:  Vitals:   11/19/19 0709  TempSrc: Tympanic  PainSc: 1          Complications: No apparent anesthesia complications

## 2019-11-20 ENCOUNTER — Other Ambulatory Visit: Payer: Self-pay | Admitting: Infectious Diseases

## 2019-11-20 LAB — BASIC METABOLIC PANEL
Anion gap: 6 (ref 5–15)
BUN: 10 mg/dL (ref 6–20)
CO2: 25 mmol/L (ref 22–32)
Calcium: 7.6 mg/dL — ABNORMAL LOW (ref 8.9–10.3)
Chloride: 109 mmol/L (ref 98–111)
Creatinine, Ser: 1.27 mg/dL — ABNORMAL HIGH (ref 0.44–1.00)
GFR calc Af Amer: 59 mL/min — ABNORMAL LOW (ref >60)
GFR calc non Af Amer: 51 mL/min — ABNORMAL LOW (ref >60)
Glucose, Bld: 99 mg/dL (ref 70–99)
Potassium: 3.8 mmol/L (ref 3.5–5.1)
Sodium: 140 mmol/L (ref 135–145)

## 2019-11-20 LAB — CBC
HCT: 26.2 % — ABNORMAL LOW (ref 36.0–46.0)
Hemoglobin: 8.8 g/dL — ABNORMAL LOW (ref 12.0–15.0)
MCH: 30.8 pg (ref 26.0–34.0)
MCHC: 33.6 g/dL (ref 30.0–36.0)
MCV: 91.6 fL (ref 80.0–100.0)
Platelets: 178 10*3/uL (ref 150–400)
RBC: 2.86 MIL/uL — ABNORMAL LOW (ref 3.87–5.11)
RDW: 13.2 % (ref 11.5–15.5)
WBC: 8.4 10*3/uL (ref 4.0–10.5)
nRBC: 0 % (ref 0.0–0.2)

## 2019-11-20 MED ORDER — KCL IN DEXTROSE-NACL 20-5-0.45 MEQ/L-%-% IV SOLN
INTRAVENOUS | Status: DC
  Filled 2019-11-20 (×6): qty 1000

## 2019-11-20 MED ORDER — ENOXAPARIN SODIUM 40 MG/0.4ML ~~LOC~~ SOLN
40.0000 mg | SUBCUTANEOUS | Status: DC
  Administered 2019-11-20 – 2019-11-24 (×5): 40 mg via SUBCUTANEOUS
  Filled 2019-11-20 (×5): qty 0.4

## 2019-11-20 MED ORDER — ORAL CARE MOUTH RINSE
15.0000 mL | Freq: Two times a day (BID) | OROMUCOSAL | Status: DC
  Administered 2019-11-20 – 2019-11-24 (×5): 15 mL via OROMUCOSAL

## 2019-11-20 NOTE — Progress Notes (Signed)
[facility] SURGICAL PROGRESS NOTE  Hospital Day(s): 1.   Post op day(s): 1 Day Post-Op.   Interval History:  Patient seen and examined no acute events or new complaints overnight.  Patient reports abdominal soreness, generalized, 6/10, responding to pain medications No nausea or emesis this morning No leukocytosis this morning, WBC 8.4K; afebrile sCr - 1.2&, this seems to be her baseline, UO - 2.1L Has been on CLD post-operatively, awaiting bowel function return Has not mobilized yet   Vital signs in last 24 hours: [min-max] current  Temp:  [97.6 F (36.4 C)-98.6 F (37 C)] 98.6 F (37 C) (04/22 0457) Pulse Rate:  [90-105] 105 (04/22 0457) Resp:  [7-17] 14 (04/22 0457) BP: (97-129)/(52-92) 97/63 (04/22 0457) SpO2:  [95 %-100 %] 99 % (04/22 0457)     Height: 4\' 11"  (149.9 cm) Weight: 67.6 kg BMI (Calculated): 30.08   Intake/Output last 2 shifts:  04/21 0701 - 04/22 0700 In: 4903.8 [P.O.:50; I.V.:4853.8] Out: 2400 [Urine:2150; Blood:250]   Physical Exam:  Constitutional: alert, cooperative and no distress  Respiratory: breathing non-labored at rest  Cardiovascular: regular rate and sinus rhythm  Gastrointestinal: Soft, generalized incisional soreness, and non-distended, no rebound/guarding Genitourinary: foley in place, no hematuria Integumentary: Laparoscopic incisions are CDI with dermabond, no erythema or drainage  Labs:  CBC Latest Ref Rng & Units 11/20/2019 10/26/2019 10/24/2019  WBC 4.0 - 10.5 K/uL 8.4 6.3 5.9  Hemoglobin 12.0 - 15.0 g/dL 10/26/2019) 11.4(L) 10.8(L)  Hematocrit 36.0 - 46.0 % 26.2(L) 34.5(L) 32.7(L)  Platelets 150 - 400 K/uL 178 390 335   CMP Latest Ref Rng & Units 11/20/2019 11/19/2019 10/26/2019  Glucose 70 - 99 mg/dL 99 10/28/2019) 97  BUN 6 - 20 mg/dL 10 11 5(L)  Creatinine 0.44 - 1.00 mg/dL 355(D) 3.22(G) 2.54(Y)  Sodium 135 - 145 mmol/L 140 139 139  Potassium 3.5 - 5.1 mmol/L 3.8 4.2 4.0  Chloride 98 - 111 mmol/L 109 109 115(H)  CO2  22 - 32 mmol/L 25 21(L) 15(L)  Calcium 8.9 - 10.3 mg/dL 7.6(L) 7.5(L) 8.5(L)  Total Protein 6.5 - 8.1 g/dL - - 6.1(L)  Total Bilirubin 0.3 - 1.2 mg/dL - - 0.5  Alkaline Phos 38 - 126 U/L - - 99  AST 15 - 41 U/L - - 14(L)  ALT 0 - 44 U/L - - 19    Imaging studies: No new pertinent imaging studies   Assessment/Plan:  44 y.o. female overall doing well still awaiting return of bowel function 1 Day Post-Op s/p robotic assisted laparoscopic sigmoid colectomy for recurrent diverticulitis.   - Continue CLD this morning; await return of bowel function  - Continue IVF resuscitation  - Morning labs   - Monitor abdominal examination; on-going bowel function  - Discontinue foley catheter  - medical management of comorbid conditions   - mobilization encouraged    All of the above findings and recommendations were discussed with the patient, and the medical team, and all of patient's questions were answered to her expressed satisfaction.  -- 52, PA-C [facility] 11/20/2019, 7:33 AM [phone] M-F: 7am - 4pm

## 2019-11-20 NOTE — Progress Notes (Signed)
PHARMACIST - PHYSICIAN COMMUNICATION  CONCERNING:  Enoxaparin (Lovenox) for DVT Prophylaxis    RECOMMENDATION: Patient was prescribed enoxaprin 30mg  q24 hours for VTE prophylaxis.   Filed Weights   11/19/19 0709  Weight: 67.6 kg (149 lb)    Body mass index is 30.09 kg/m.  Estimated Creatinine Clearance: 46.8 mL/min (A) (by C-G formula based on SCr of 1.27 mg/dL (H)).  Patient is candidate for enoxaparin 40mg  every 24 hours based on CrCl >80ml/min or Weight > 45kg for women   DESCRIPTION: Pharmacy has adjusted enoxaparin dose per [facility] policy.  Patient is now receiving enoxaparin 40mg  every 24 hours.  31m, PharmD Clinical Pharmacist  11/20/2019 7:31 AM

## 2019-11-21 LAB — BASIC METABOLIC PANEL
Anion gap: 6 (ref 5–15)
BUN: 6 mg/dL (ref 6–20)
CO2: 26 mmol/L (ref 22–32)
Calcium: 7.5 mg/dL — ABNORMAL LOW (ref 8.9–10.3)
Chloride: 111 mmol/L (ref 98–111)
Creatinine, Ser: 1.11 mg/dL — ABNORMAL HIGH (ref 0.44–1.00)
GFR calc Af Amer: >60 mL/min (ref >60)
GFR calc non Af Amer: 60 mL/min — ABNORMAL LOW (ref >60)
Glucose, Bld: 86 mg/dL (ref 70–99)
Potassium: 3.5 mmol/L (ref 3.5–5.1)
Sodium: 143 mmol/L (ref 135–145)

## 2019-11-21 LAB — CBC
HCT: 24.9 % — ABNORMAL LOW (ref 36.0–46.0)
Hemoglobin: 8.1 g/dL — ABNORMAL LOW (ref 12.0–15.0)
MCH: 30.7 pg (ref 26.0–34.0)
MCHC: 32.5 g/dL (ref 30.0–36.0)
MCV: 94.3 fL (ref 80.0–100.0)
Platelets: 151 10*3/uL (ref 150–400)
RBC: 2.64 MIL/uL — ABNORMAL LOW (ref 3.87–5.11)
RDW: 13.6 % (ref 11.5–15.5)
WBC: 6.2 10*3/uL (ref 4.0–10.5)
nRBC: 0 % (ref 0.0–0.2)

## 2019-11-21 LAB — SURGICAL PATHOLOGY

## 2019-11-21 MED ORDER — CYCLOBENZAPRINE HCL 10 MG PO TABS
5.0000 mg | ORAL_TABLET | Freq: Three times a day (TID) | ORAL | Status: DC | PRN
  Administered 2019-11-21 – 2019-11-23 (×3): 5 mg via ORAL
  Filled 2019-11-21 (×3): qty 1

## 2019-11-21 NOTE — Anesthesia Postprocedure Evaluation (Signed)
Anesthesia Post Note  Patient: Sara Chapman  Procedure(s) Performed: XI ROBOT ASSISTED Sigmoid Resection (N/A ) CYSTOSCOPY (N/A ) INDOCYANINE GREEN FLUORESCENCE IMAGING (ICG)  Patient location during evaluation: PACU Anesthesia Type: General Level of consciousness: awake and alert Pain management: pain level controlled Vital Signs Assessment: post-procedure vital signs reviewed and stable Respiratory status: spontaneous breathing, nonlabored ventilation, respiratory function stable and patient connected to nasal cannula oxygen Cardiovascular status: blood pressure returned to baseline and stable Postop Assessment: no apparent nausea or vomiting Anesthetic complications: no     Last Vitals:  Vitals:   11/20/19 2039 11/21/19 0428  BP: 112/82 (!) 135/92  Pulse: 80 79  Resp: 18 18  Temp: 37.1 C 36.7 C  SpO2: 96% 98%    Last Pain:  Vitals:   11/21/19 0507  TempSrc:   PainSc: 7                  Yevette Edwards

## 2019-11-21 NOTE — Progress Notes (Signed)
[facility] SURGICAL PROGRESS NOTE  Hospital Day(s): 2.   Post op day(s): 2 Days Post-Op.   Interval History:  Patient seen and examined no acute events or new complaints overnight.  Patient reports she is doing okay, still with abdominal soreness at the incisions, worse with walking, improved some from pain medications WBC remains in normal limits at 6.2K; afebrile Hemoglobin 8.1, suspect dilutional sCr - 1.11, good UO Tolerated CLD; + BM yesterday Mobilizing well   Vital signs in last 24 hours: [min-max] current  Temp:  [98 F (36.7 C)-98.7 F (37.1 C)] 98 F (36.7 C) (04/23 0428) Pulse Rate:  [79-92] 79 (04/23 0428) Resp:  [12-18] 18 (04/23 0428) BP: (108-135)/(72-92) 135/92 (04/23 0428) SpO2:  [92 %-98 %] 98 % (04/23 0428)     Height: 4\' 11"  (149.9 cm) Weight: 67.6 kg BMI (Calculated): 30.08   Intake/Output last 2 shifts:  04/22 0701 - 04/23 0700 In: 1725.9 [I.V.:1725.9] Out: 475 [Urine:475]   Physical Exam:  Constitutional: alert, cooperative and no distress  Respiratory: breathing non-labored at rest  Cardiovascular: regular rate and sinus rhythm  Gastrointestinal: Soft, generalized incisional soreness, and non-distended, no rebound/guarding Integumentary: Laparoscopic incisions are CDI with dermabond, no erythema or drainage   Labs:  CBC Latest Ref Rng & Units 11/21/2019 11/20/2019 10/26/2019  WBC 4.0 - 10.5 K/uL 6.2 8.4 6.3  Hemoglobin 12.0 - 15.0 g/dL 8.1(L) 8.8(L) 11.4(L)  Hematocrit 36.0 - 46.0 % 24.9(L) 26.2(L) 34.5(L)  Platelets 150 - 400 K/uL 151 178 390   CMP Latest Ref Rng & Units 11/21/2019 11/20/2019 11/19/2019  Glucose 70 - 99 mg/dL 86 99 11/21/2019)  BUN 6 - 20 mg/dL 6 10 11   Creatinine 0.44 - 1.00 mg/dL 315(Q) ) 0.08(Q)  Sodium 135 - 145 mmol/L 143 140 139  Potassium 3.5 - 5.1 mmol/L 3.5 3.8 4.2  Chloride 98 - 111 mmol/L 111 109 109  CO2 22 - 32 mmol/L 26 25 21(L)  Calcium 8.9 - 10.3 mg/dL 7.5(L) 7.6(L) 7.5(L)  Total Protein  6.5 - 8.1 g/dL - - -  Total Bilirubin 0.3 - 1.2 mg/dL - - -  Alkaline Phos 38 - 126 U/L - - -  AST 15 - 41 U/L - - -  ALT 0 - 44 U/L - - -     Imaging studies: No new pertinent imaging studies   Assessment/Plan:  44 y.o. female overall doing well, issues with pain control, but now with return of bowel function 2 Days Post-Op s/p robotic assisted laparoscopic sigmoid colectomy for recurrent diverticulitis.   - Advance FLD; ADAT if bowel function continues  - Continue Pain control             - Wean to Discontinue IVF resuscitation             - Monitor abdominal examination; on-going bowel function  - medical management of comorbid conditions              - mobilization encouraged     All of the above findings and recommendations were discussed with the patient, and the medical team, and all of patient's questions were answered to her expressed satisfaction.  -- 5.09(T, PA-C [facility] 11/21/2019, 7:06 AM [phone] M-F: 7am - 4pm

## 2019-11-22 NOTE — Progress Notes (Signed)
Subjective:  CC: Sara Chapman is a 44 y.o. female  Hospital stay day 3, 3 Days Post-Op robotic assisted lap sigmoidectomy for diverticulitis  HPI: No issues overnight.  Still has pain around incision sites but tolerating full liquid, having soft stools and passing flatus.  Ambulating out of bed, minimal nausea  ROS:  General: Denies weight loss, weight gain, fatigue, fevers, chills, and night sweats. Heart: Denies chest pain, palpitations, racing heart, irregular heartbeat, leg pain or swelling, and decreased activity tolerance. Respiratory: Denies breathing difficulty, shortness of breath, wheezing, cough, and sputum. GI: Denies change in appetite, heartburn, nausea, vomiting, constipation, diarrhea, and blood in stool. GU: Denies difficulty urinating, pain with urinating, urgency, frequency, blood in urine.   Objective:   Temp:  [98.3 F (36.8 C)-98.4 F (36.9 C)] 98.4 F (36.9 C) (04/24 0431) Pulse Rate:  [75-88] 75 (04/24 0431) Resp:  [16-18] 18 (04/24 0431) BP: (125-141)/(84-97) 141/91 (04/24 0431) SpO2:  [96 %-97 %] 96 % (04/24 0431)     Height: 4\' 11"  (149.9 cm) Weight: 67.6 kg BMI (Calculated): 30.08   Intake/Output this shift:   Intake/Output Summary (Last 24 hours) at 11/22/2019 1110 Last data filed at 11/22/2019 0620 Gross per 24 hour  Intake 240 ml  Output 0 ml  Net 240 ml    Constitutional :  alert, cooperative and appears stated age  Respiratory:  clear to auscultation bilaterally  Cardiovascular:  regular rate and rhythm  Gastrointestinal: soft, no guarding, minimal TTP around periumbilical area, no rebound tenderness.   Skin: Cool and moist. Port site incisions c/d/i.  Psychiatric: Normal affect, non-agitated, not confused       LABS:  CMP Latest Ref Rng & Units 11/21/2019 11/20/2019 11/19/2019  Glucose 70 - 99 mg/dL 86 99 11/21/2019)  BUN 6 - 20 mg/dL 6 10 11   Creatinine 0.44 - 1.00 mg/dL 175(Z) ) 0.25(E)  Sodium 135 - 145 mmol/L 143 140 139   Potassium 3.5 - 5.1 mmol/L 3.5 3.8 4.2  Chloride 98 - 111 mmol/L 111 109 109  CO2 22 - 32 mmol/L 26 25 21(L)  Calcium 8.9 - 10.3 mg/dL 7.5(L) 7.6(L) 7.5(L)  Total Protein 6.5 - 8.1 g/dL - - -  Total Bilirubin 0.3 - 1.2 mg/dL - - -  Alkaline Phos 38 - 126 U/L - - -  AST 15 - 41 U/L - - -  ALT 0 - 44 U/L - - -   CBC Latest Ref Rng & Units 11/21/2019 11/20/2019 10/26/2019  WBC 4.0 - 10.5 K/uL 6.2 8.4 6.3  Hemoglobin 12.0 - 15.0 g/dL 8.1(L) 8.8(L) 11.4(L)  Hematocrit 36.0 - 46.0 % 24.9(L) 26.2(L) 34.5(L)  Platelets 150 - 400 K/uL 151 178 390    RADS: n/a Assessment:   S/p lap assisted sigmoidectomy for diverticulitis.  Tolerating fulls, with soft stool, flatus, minimal pain and nausea.  Will advance to soft and monitor for any issues.  Hopefully d/c in next day or two.

## 2019-11-23 LAB — CBC WITH DIFFERENTIAL/PLATELET
Abs Immature Granulocytes: 0.01 10*3/uL (ref 0.00–0.07)
Basophils Absolute: 0 10*3/uL (ref 0.0–0.1)
Basophils Relative: 1 %
Eosinophils Absolute: 0.3 10*3/uL (ref 0.0–0.5)
Eosinophils Relative: 6 %
HCT: 30.3 % — ABNORMAL LOW (ref 36.0–46.0)
Hemoglobin: 10.2 g/dL — ABNORMAL LOW (ref 12.0–15.0)
Immature Granulocytes: 0 %
Lymphocytes Relative: 41 %
Lymphs Abs: 2.2 10*3/uL (ref 0.7–4.0)
MCH: 30.7 pg (ref 26.0–34.0)
MCHC: 33.7 g/dL (ref 30.0–36.0)
MCV: 91.3 fL (ref 80.0–100.0)
Monocytes Absolute: 0.3 10*3/uL (ref 0.1–1.0)
Monocytes Relative: 5 %
Neutro Abs: 2.5 10*3/uL (ref 1.7–7.7)
Neutrophils Relative %: 47 %
Platelets: 233 10*3/uL (ref 150–400)
RBC: 3.32 MIL/uL — ABNORMAL LOW (ref 3.87–5.11)
RDW: 13.3 % (ref 11.5–15.5)
WBC: 5.3 10*3/uL (ref 4.0–10.5)
nRBC: 0 % (ref 0.0–0.2)

## 2019-11-23 LAB — BASIC METABOLIC PANEL
Anion gap: 7 (ref 5–15)
BUN: 11 mg/dL (ref 6–20)
CO2: 27 mmol/L (ref 22–32)
Calcium: 8.5 mg/dL — ABNORMAL LOW (ref 8.9–10.3)
Chloride: 105 mmol/L (ref 98–111)
Creatinine, Ser: 1.08 mg/dL — ABNORMAL HIGH (ref 0.44–1.00)
GFR calc Af Amer: >60 mL/min (ref >60)
GFR calc non Af Amer: >60 mL/min (ref >60)
Glucose, Bld: 88 mg/dL (ref 70–99)
Potassium: 3.6 mmol/L (ref 3.5–5.1)
Sodium: 139 mmol/L (ref 135–145)

## 2019-11-23 LAB — MAGNESIUM: Magnesium: 1.7 mg/dL (ref 1.7–2.4)

## 2019-11-23 LAB — PHOSPHORUS: Phosphorus: 4.4 mg/dL (ref 2.5–4.6)

## 2019-11-23 MED ORDER — PANTOPRAZOLE SODIUM 40 MG PO TBEC
40.0000 mg | DELAYED_RELEASE_TABLET | Freq: Every day | ORAL | Status: DC
  Administered 2019-11-23: 40 mg via ORAL
  Filled 2019-11-23: qty 1

## 2019-11-23 MED ORDER — OXYCODONE HCL 5 MG PO TABS
5.0000 mg | ORAL_TABLET | ORAL | Status: DC | PRN
  Administered 2019-11-23 – 2019-11-24 (×3): 5 mg via ORAL
  Filled 2019-11-23 (×4): qty 1

## 2019-11-23 NOTE — Progress Notes (Signed)
Subjective:  CC: Sara Chapman is a 44 y.o. female  Hospital stay day 4, 4 Days Post-Op robotic assisted lap sigmoidectomy for diverticulitis  HPI: No issues overnight.  Still has pain around incision sites but tolerating soft food, having soft/liquid stools and passing flatus.  Ambulating out of bed, minimal nausea  ROS:  General: Denies weight loss, weight gain, fatigue, fevers, chills, and night sweats. Heart: Denies chest pain, palpitations, racing heart, irregular heartbeat, leg pain or swelling, and decreased activity tolerance. Respiratory: Denies breathing difficulty, shortness of breath, wheezing, cough, and sputum. GI: Denies change in appetite, heartburn, nausea, vomiting, constipation, diarrhea, and blood in stool. GU: Denies difficulty urinating, pain with urinating, urgency, frequency, blood in urine.   Objective:   Temp:  [98.4 F (36.9 C)-98.8 F (37.1 C)] 98.6 F (37 C) (04/25 1314) Pulse Rate:  [67-93] 67 (04/25 1314) Resp:  [16] 16 (04/25 0423) BP: (121-151)/(78-96) 151/96 (04/25 1314) SpO2:  [96 %-98 %] 98 % (04/25 0423)     Height: 4\' 11"  (149.9 cm) Weight: 67.6 kg BMI (Calculated): 30.08   Intake/Output this shift:   Intake/Output Summary (Last 24 hours) at 11/23/2019 1411 Last data filed at 11/23/2019 0930 Gross per 24 hour  Intake 120 ml  Output -  Net 120 ml    Constitutional :  alert, cooperative and appears stated age  Respiratory:  clear to auscultation bilaterally  Cardiovascular:  regular rate and rhythm  Gastrointestinal: soft, no guarding, minimal TTP around periumbilical area, no rebound tenderness. Some TTP around all incision sites.  Skin: Cool and moist. Port site incisions remain c/d/i.  Psychiatric: Normal affect, non-agitated, not confused       LABS:  CMP Latest Ref Rng & Units 11/23/2019 11/21/2019 11/20/2019  Glucose 70 - 99 mg/dL 88 86 99  BUN 6 - 20 mg/dL 11 6 10   Creatinine 0.44 - 1.00 mg/dL 11/22/2019) ) 9.37(T)   Sodium 135 - 145 mmol/L 139 143 140  Potassium 3.5 - 5.1 mmol/L 3.6 3.5 3.8  Chloride 98 - 111 mmol/L 105 111 109  CO2 22 - 32 mmol/L 27 26 25   Calcium 8.9 - 10.3 mg/dL 0.24(O) 7.5(L) 7.6(L)  Total Protein 6.5 - 8.1 g/dL - - -  Total Bilirubin 0.3 - 1.2 mg/dL - - -  Alkaline Phos 38 - 126 U/L - - -  AST 15 - 41 U/L - - -  ALT 0 - 44 U/L - - -   CBC Latest Ref Rng & Units 11/23/2019 11/21/2019 11/20/2019  WBC 4.0 - 10.5 K/uL 5.3 6.2 8.4  Hemoglobin 12.0 - 15.0 g/dL 10.2(L) 8.1(L) 8.8(L)  Hematocrit 36.0 - 46.0 % 30.3(L) 24.9(L) 26.2(L)  Platelets 150 - 400 K/uL 233 151 178    RADS: n/a Assessment:   S/p lap assisted sigmoidectomy for diverticulitis.  Tolerating soft, but still has decent incisional pain.  Decreasing needs for pain meds reported, but Not comfortable going home yet. Encourage flexeril use and will monitor overnight.

## 2019-11-23 NOTE — Progress Notes (Signed)
PHARMACIST - PHYSICIAN COMMUNICATION  DR:   Tonna Boehringer  CONCERNING: IV to Oral Route Change Policy  RECOMMENDATION: This patient is receiving pantoprazole by the intravenous route.  Based on criteria approved by the Pharmacy and Therapeutics Committee, the intravenous medication(s) is/are being converted to the equivalent oral dose form(s).   DESCRIPTION: These criteria include:  The patient is eating (either orally or via tube) and/or has been taking other orally administered medications for a least 24 hours  The patient has no evidence of active gastrointestinal bleeding or impaired GI absorption (gastrectomy, short bowel, patient on TNA or NPO).  If you have questions about this conversion, please contact the Pharmacy Department  []   [phone] )  ( [phone] [x]   [phone] )  [facility] []   [phone] )  [facility] []   [phone] )  [facility] []   [phone] )  [facility]   ( [phone], [facility] 11/23/2019 3:08 PM

## 2019-11-24 LAB — CBC WITH DIFFERENTIAL/PLATELET
Abs Immature Granulocytes: 0.01 10*3/uL (ref 0.00–0.07)
Basophils Absolute: 0 10*3/uL (ref 0.0–0.1)
Basophils Relative: 0 %
Eosinophils Absolute: 0.4 10*3/uL (ref 0.0–0.5)
Eosinophils Relative: 6 %
HCT: 29.9 % — ABNORMAL LOW (ref 36.0–46.0)
Hemoglobin: 10.2 g/dL — ABNORMAL LOW (ref 12.0–15.0)
Immature Granulocytes: 0 %
Lymphocytes Relative: 44 %
Lymphs Abs: 2.6 10*3/uL (ref 0.7–4.0)
MCH: 30.3 pg (ref 26.0–34.0)
MCHC: 34.1 g/dL (ref 30.0–36.0)
MCV: 88.7 fL (ref 80.0–100.0)
Monocytes Absolute: 0.3 10*3/uL (ref 0.1–1.0)
Monocytes Relative: 5 %
Neutro Abs: 2.6 10*3/uL (ref 1.7–7.7)
Neutrophils Relative %: 45 %
Platelets: 260 10*3/uL (ref 150–400)
RBC: 3.37 MIL/uL — ABNORMAL LOW (ref 3.87–5.11)
RDW: 13.2 % (ref 11.5–15.5)
WBC: 5.8 10*3/uL (ref 4.0–10.5)
nRBC: 0 % (ref 0.0–0.2)

## 2019-11-24 LAB — BASIC METABOLIC PANEL
Anion gap: 7 (ref 5–15)
BUN: 9 mg/dL (ref 6–20)
CO2: 26 mmol/L (ref 22–32)
Calcium: 8.3 mg/dL — ABNORMAL LOW (ref 8.9–10.3)
Chloride: 105 mmol/L (ref 98–111)
Creatinine, Ser: 1.08 mg/dL — ABNORMAL HIGH (ref 0.44–1.00)
GFR calc Af Amer: >60 mL/min (ref >60)
GFR calc non Af Amer: >60 mL/min (ref >60)
Glucose, Bld: 86 mg/dL (ref 70–99)
Potassium: 3.4 mmol/L — ABNORMAL LOW (ref 3.5–5.1)
Sodium: 138 mmol/L (ref 135–145)

## 2019-11-24 LAB — MAGNESIUM: Magnesium: 1.6 mg/dL — ABNORMAL LOW (ref 1.7–2.4)

## 2019-11-24 LAB — PHOSPHORUS: Phosphorus: 3.9 mg/dL (ref 2.5–4.6)

## 2019-11-24 MED ORDER — OXYCODONE HCL 5 MG PO TABS: 5.0000 mg | ORAL_TABLET | ORAL | 0 refills | Status: DC | PRN

## 2019-11-24 MED ORDER — CYCLOBENZAPRINE HCL 5 MG PO TABS: 5.0000 mg | ORAL_TABLET | Freq: Three times a day (TID) | ORAL | 0 refills | Status: DC | PRN

## 2019-11-24 MED ORDER — ONDANSETRON 4 MG PO TBDP: 4.0000 mg | ORAL_TABLET | Freq: Four times a day (QID) | ORAL | 0 refills | Status: DC | PRN

## 2019-11-24 NOTE — Discharge Summary (Signed)
[facility] SURGICAL DISCHARGE SUMMARY  Patient ID: Sara Chapman MRN: [id number] DOB/AGE: 1976-07-08 44 y.o.  Admit date: 11/19/2019 Discharge date: 11/24/2019  Discharge Diagnoses Patient Active Problem List   Diagnosis Date Noted  . Essential hypertension   . Chronic nonintractable headache   . 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HCC)   . Psoriatic arthritis (HCC)   . Acute diverticulitis 10/18/2019  . Sepsis (HCC) 10/18/2019  . Diverticulitis large intestine 09/16/2019  . Colonic diverticular abscess 09/03/2019    Consultants None  Procedures 11/19/2019:  Robotic sigmoid colectomy with coloproctostomy and splenic flexure mobilization.  HPI: Sara Chapman is a 44 y.o. 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   Hospital Course: Informed consent was obtained and documented, and patient underwent uneventful Robotic sigmoid colectomy with coloproctostomy and splenic flexure mobilization (Dr Claudine Mouton, 11/19/2019).  Post-operatively, patient's biggest issue was post-operative pain which graduially improved and advancement of patient's diet and ambulation were well-tolerated. The remainder of patient's hospital course was essentially unremarkable, and discharge planning was initiated accordingly with patient safely able to be discharged home with appropriate discharge instructions, pain control, and outpatient follow-up after all of her questions were answered to her expressed satisfaction.   Discharge Condition: Good   Physical Examination:  Constitutional: Well appearing female, NAD Pulmonary: Normal effort, no respiratory distress Gastrointestinal: Soft, incisional soreness, non-distended, no rebound/guarding Skin: Laparoscopic incisions are CDI with dermabond, some ecchymosis, no erythema or drainage    Allergies as of 11/24/2019      Reactions   Clonazepam Other (See Comments)   SUICIDAL THOUGHTS   Ibuprofen Other (See Comments)   stomach ulcers   Nsaids Other (See Comments)   GI ulcers   Tolmetin Other (See Comments)   GI ulcers   Pseudoephedrine Hcl Palpitations, Other (See Comments)      Tape Rash   Paper tape breaks down the skin and causes sores      Medication List    STOP taking these medications   HEPARIN LOCK FLUSH IV   polyethylene glycol powder 17 GM/SCOOP powder Commonly known as: MiraLax     TAKE these medications   amLODipine 5 MG tablet Commonly known as: NORVASC Take 1 tablet (5 mg total) by mouth daily. What changed: when to take this   cyclobenzaprine 5 MG tablet Commonly known as: FLEXERIL Take 1 tablet (5 mg total) by mouth 3 (three) times daily as needed for muscle spasms.   multivitamin tablet Take 1 tablet by mouth daily.   omeprazole 40 MG capsule Commonly known as: PRILOSEC Take 40 mg by mouth 2 (two) times daily.   ondansetron 4 MG disintegrating tablet Commonly known as: ZOFRAN-ODT Take 1 tablet (4 mg total) by mouth every 6 (six) hours as needed for nausea.   oxyCODONE 5 MG immediate release tablet Commonly known as: Oxy IR/ROXICODONE Take 1 tablet (5 mg total) by mouth every 4 (four) hours as needed for severe pain.   sodium chloride flush 0.9 % Soln Commonly known as: NS 77ml before and after antibiotic infusion   topiramate 50 MG tablet Commonly known as: TOPAMAX Take 1 tablet (50 mg total) by mouth 2 (two) times daily.   traMADol 50 MG tablet Commonly known as: ULTRAM Take 1 tablet (50 mg total) by mouth every 8 (eight) hours as needed for severe pain.   VITAMIN C PO Take 1 tablet by mouth daily. GUMMY   VITAMIN D PO  Take 1 tablet by mouth daily.        Follow-up Information    Ronny Bacon, MD. Schedule an appointment as soon as possible for a visit in 1 week(s).   Specialty: General Surgery Why: 1-2 week follow up with Dr Christian Mate, s/p lap sigmoid  colectomy Contact information: [address] [place] [address] [phone]            Time spent on discharge management including discussion of hospital course, clinical condition, outpatient instructions, prescriptions, and follow up with the patient and members of the medical team: >30 minutes  -- Edison Simon , PA-C [facility]  11/24/2019, 9:08 AM [phone] M-F: 7am - 4pm

## 2019-11-24 NOTE — Progress Notes (Signed)
Sara Chapman Sports to be D/C'd home with husband per MD order.  Discussed prescriptions and follow up appointments with the patient. Prescriptions given to patient, medication list explained in detail. Pt verbalized understanding.  Allergies as of 11/24/2019       Reactions   Clonazepam Other (See Comments)   SUICIDAL THOUGHTS   Ibuprofen Other (See Comments)   stomach ulcers   Nsaids Other (See Comments)   GI ulcers   Tolmetin Other (See Comments)   GI ulcers   Pseudoephedrine Hcl Palpitations, Other (See Comments)      Tape Rash   Paper tape breaks down the skin and causes sores        Medication List     STOP taking these medications    HEPARIN LOCK FLUSH IV   polyethylene glycol powder 17 GM/SCOOP powder Commonly known as: MiraLax       TAKE these medications    amLODipine 5 MG tablet Commonly known as: NORVASC Take 1 tablet (5 mg total) by mouth daily. What changed: when to take this   cyclobenzaprine 5 MG tablet Commonly known as: FLEXERIL Take 1 tablet (5 mg total) by mouth 3 (three) times daily as needed for muscle spasms.   multivitamin tablet Take 1 tablet by mouth daily.   omeprazole 40 MG capsule Commonly known as: PRILOSEC Take 40 mg by mouth 2 (two) times daily.   ondansetron 4 MG disintegrating tablet Commonly known as: ZOFRAN-ODT Take 1 tablet (4 mg total) by mouth every 6 (six) hours as needed for nausea.   oxyCODONE 5 MG immediate release tablet Commonly known as: Oxy IR/ROXICODONE Take 1 tablet (5 mg total) by mouth every 4 (four) hours as needed for severe pain.   sodium chloride flush 0.9 % Soln Commonly known as: NS 79ml before and after antibiotic infusion   topiramate 50 MG tablet Commonly known as: TOPAMAX Take 1 tablet (50 mg total) by mouth 2 (two) times daily.   traMADol 50 MG tablet Commonly known as: ULTRAM Take 1 tablet (50 mg total) by mouth every 8 (eight) hours as needed for severe pain.   VITAMIN C PO Take 1  tablet by mouth daily. GUMMY   VITAMIN D PO Take 1 tablet by mouth daily.        Vitals:   11/23/19 2010 11/24/19 0408  BP: (!) 151/90 124/85  Pulse: 73 83  Resp: 16 18  Temp: 98.6 F (37 C) (!) 97.3 F (36.3 C)  SpO2: 97% 95%    Skin clean, dry and intact without evidence of skin break down, no evidence of skin tears noted. IV catheter discontinued intact. Site without signs and symptoms of complications. Dressing and pressure applied. Pt denies pain at this time. No complaints noted.  An After Visit Summary was printed and given to the patient. Patient escorted via WC, and D/C home via private auto.  Sara Chapman Sara Chapman

## 2019-11-24 NOTE — Discharge Instructions (Signed)
In addition to included general post-operative instructions for laparoscopic sigmoid colectomy,  Diet: Resume home diet.   Activity: No heavy lifting >20 pounds (children, pets, laundry, garbage) for a total of 6 weeks, but light activity and walking are encouraged. Do not drive or drink alcohol if taking narcotic pain medications or having pain that might distract from driving.  Wound care: You may shower/get incision wet with soapy water and pat dry (do not rub incisions), but no baths or submerging incision underwater until follow-up.   Medications: Resume all home medications. For mild to moderate pain: acetaminophen (Tylenol) or ibuprofen/naproxen (if no kidney disease). Combining Tylenol with alcohol can substantially increase your risk of causing liver disease. Narcotic pain medications, if prescribed, can be used for severe pain, though may cause nausea, constipation, and drowsiness. Do not combine Tylenol and Percocet (or similar) within a 6 hour period as Percocet (and similar) contain(s) Tylenol. If you do not need the narcotic pain medication, you do not need to fill the prescription.  Call office [phone] / [phone]) at any time if any questions, worsening pain, fevers/chills, bleeding, drainage from incision site, or other concerns.

## 2019-11-27 ENCOUNTER — Telehealth: Payer: Self-pay | Admitting: Surgery

## 2019-11-27 NOTE — Telephone Encounter (Signed)
Patient is calling and is asking for one of the nurses to give her a call patient is c/o having a low grade fever, stomach, head hurts, feels nausea. Please call patient and advise.

## 2019-11-27 NOTE — Telephone Encounter (Signed)
Spoke with patient asked if she wanted to come in patient said she took a cold shower and was feeling a little better and said she may call back if anything changes.

## 2019-12-01 ENCOUNTER — Telehealth (INDEPENDENT_AMBULATORY_CARE_PROVIDER_SITE_OTHER): Payer: Self-pay | Admitting: Surgery

## 2019-12-01 DIAGNOSIS — Z9049 Acquired absence of other specified parts of digestive tract: Secondary | ICD-10-CM | POA: Insufficient documentation

## 2019-12-01 NOTE — Addendum Note (Signed)
Addended by: Campbell Lerner on: 12/01/2019 12:53 PM   Modules accepted: Level of Service

## 2019-12-01 NOTE — Telephone Encounter (Signed)
I spoke with Sara Chapman in light of the above.  I recommended she begin with Miralax, 3-4 doses at once, and repeated 2-3 times today pending the response, along with a bottle of Magnesium Citrate, and if she wished to add, a Fleet enema.  We agreed, she likely has a hard stool, which is now having some overflow of liquid stool, causing the soilage, and challenges with relaxing sufficiently to enable voiding comfortably.  She denies any f/c, and her pain seems localized to the ano-rectal region.   Anticipate seeing her no later than tomorrow.

## 2019-12-01 NOTE — Telephone Encounter (Signed)
Patient states that she has not had a bowel movement since Friday and that was very little. She does feel the need to go but just can't. She reports it is now affecting her ability to urinate. She had to lean all the way forward this morning in order to empty her bladder. She does report some yellow anal leakage this morning.  I placed a call to Dr Claudine Mouton about this and he will contact the patient to speak with her.

## 2019-12-01 NOTE — Telephone Encounter (Signed)
Patient called confirming her appt w/Dr. Claudine Mouton for tomorrow, as well states she is concerned that although she had sigmoid resection on 04/21, she fears that she may be blocked (unable to pass any solid stool or urine) since the Saturday after sx.  She was curious to know if this is something that she should wait until tomorrow to discuss w/Dr. Claudine Mouton or if she should go to UC or PCP for help.  Sara Chapman is aware that because she has recently had bowel sx, this will be routed through the clinical team & to Dr. Claudine Mouton for clarification as needed.  She is best reached back @ [phone].  Thank yopu

## 2019-12-02 ENCOUNTER — Ambulatory Visit (INDEPENDENT_AMBULATORY_CARE_PROVIDER_SITE_OTHER): Payer: Self-pay | Admitting: Surgery

## 2019-12-02 ENCOUNTER — Other Ambulatory Visit: Payer: Self-pay

## 2019-12-02 ENCOUNTER — Encounter: Payer: Self-pay | Admitting: Surgery

## 2019-12-02 VITALS — BP 141/93 | HR 54 | Temp 98.1°F | Resp 14 | Ht 59.0 in | Wt 144.8 lb

## 2019-12-02 DIAGNOSIS — K5720 Diverticulitis of large intestine with perforation and abscess without bleeding: Secondary | ICD-10-CM

## 2019-12-02 DIAGNOSIS — R1084 Generalized abdominal pain: Secondary | ICD-10-CM

## 2019-12-02 NOTE — Progress Notes (Signed)
[facility] POST-OP OFFICE VISIT  12/02/2019  HPI: Sara Chapman is a 44 y.o. female 13 days s/p robotic sigmoid colectomy.  Most recently in her postoperative course she has felt as though there was a large stool in her rectum with anal rectal pain.  She denies fevers and chills.  Advised yesterday that she get aggressive with her oral laxatives of MiraLAX, mag citrate and even a Fleet enema as needed she did all the above and added a rectal suppository of Dulcolax as well.  She had lots of bowel movements mostly liquid not the bulky stool she was expecting to pass, still convinced that there is some mass of stool still present in her colon.    Vital signs: BP (!) 141/93   Pulse (!) 54   Temp 98.1 F (36.7 C)   Resp 14   Ht 4\' 11"  (1.499 m)   Wt 144 lb 12.8 oz (65.7 kg)   LMP 11/19/2019   SpO2 98%   BMI 29.25 kg/m    Physical Exam: Constitutional: She appears well, nontoxic.  She moves and ambulates quite well. Abdomen: All incisions are well healed, clean, dry and intact.  Expected postoperative incisional tenderness.  Soft without any rigidity. Skin: Intact DRE: Digital exam is completed to the best of my reach, I am unable to palpate the anastomosis, or any significant stool.  Reachable vault is empty, she does have some palpable hemorrhoidal cords circumferentially without significant redundancy, tenderness or marked inflammatory changes.   Assessment/Plan: This is a 44 y.o. female 13 days s/p sigmoid colectomy.  Patient Active Problem List   Diagnosis Date Noted  . S/P laparoscopic colectomy 12/01/2019  . Essential hypertension   . Chronic nonintractable headache   . 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HCC)   . Psoriatic arthritis (HCC)   . Acute diverticulitis 10/18/2019  . Sepsis (HCC) 10/18/2019  . Diverticulitis large intestine 09/16/2019  . Colonic diverticular abscess 09/03/2019    -I believe her current syndrome is to be expected at this  point in her recovery.  I believe her bowel habits are likely going to find a new rhythm.  If there is a degree of high impaction that may be somewhat slowed by a narrowed anastomosis, I want to know and will obtain a CT scan to evaluate the appearance of her pelvic anastomosis.  I did not appreciate any perirectal fluctuance.  And I believe that she should continue with her current aggressive regimen of laxatives until we have additional reassurance that there is no high impaction present.  I believe she understands this and I discussed this with her husband as well.  We will see what her CT scan shows and have her follow-up in 2 weeks.   11/01/2019 M.D., FACS 12/02/2019, 2:44 PM

## 2019-12-02 NOTE — Patient Instructions (Addendum)
Continue to drink plenty of fluids and keep eating a high fiber diet. Keep using Miralax 1-2 times daily. You need to have 1-2 good bowel movements a day. Adjust Miralax dose as needed.  You have been scheduled for a CT abdomen/pelvis with contrast at [facility] on Friday May 7th at 11:30 am, you will need to arrive there by 11:15 am. Prep: Nothing to eat or drink 4 hours prior and you will need to pick up a prep kit.   Follow up here in 2 weeks.    Fiber Content in Foods  See the following list for the dietary fiber content of some common foods. High-fiber foods High-fiber foods contain 4 grams or more (4g or more) of fiber per serving. They include:  Artichoke (fresh) -- 1 medium has 10.3g of fiber.  Baked beans, plain or vegetarian (canned) --  cup has 5.2g of fiber.  Blackberries or raspberries (fresh) --  cup has 4g of fiber.  Bran cereal --  cup has 8.6g of fiber.  Bulgur (cooked) --  cup has 4g of fiber.  Kidney beans (canned) --  cup has 6.8g of fiber.  Lentils (cooked) --  cup has 7.8g of fiber.  Pear (fresh) -- 1 medium has 5.1g of fiber.  Peas (frozen) --  cup has 4.4g of fiber.  Pinto beans (canned) --  cup has 5.5g of fiber.  Pinto beans (dried and cooked) --  cup has 7.7g of fiber.  Potato with skin (baked) -- 1 medium has 4.4g of fiber.  Quinoa (cooked) --  cup has 5g of fiber.  Soybeans (canned, frozen, or fresh) --  cup has 5.1g of fiber. Moderate-fiber foods Moderate-fiber foods contain 1-4 grams (1-4g) of fiber per serving. They include:  Almonds -- 1 oz. has 3.5g of fiber.  Apple with skin -- 1 medium has 3.3g of fiber.  Applesauce, sweetened --  cup has 1.5g of fiber.  Bagel, plain -- one 4-inch (10-cm) bagel has 2g of fiber.  Banana -- 1 medium has 3.1g of fiber.  Broccoli (cooked) --  cup has 2.5g of fiber.  Carrots (cooked) --  cup has 2.3g of fiber.  Corn (canned or frozen) --  cup has 2.1g of fiber.  Corn  tortilla -- one 6-inch (15-cm) tortilla has 1.5g of fiber.  Green beans (canned) --  cup has 2g of fiber.  Instant oatmeal --  cup has about 2g of fiber.  Long-grain brown rice (cooked) -- 1 cup has 3.5g of fiber.  Macaroni, enriched (cooked) -- 1 cup has 2.5g of fiber.  Melon -- 1 cup has 1.4g of fiber.  Multigrain cereal --  cup has about 2-4g of fiber.  Orange -- 1 small has 3.1g of fiber.  Potatoes, mashed --  cup has 1.6g of fiber.  Raisins -- 1/4 cup has 1.6g of fiber.  Squash --  cup has 2.9g of fiber.  Sunflower seeds --  cup has 1.1g of fiber.  Tomato -- 1 medium has 1.5g of fiber.  Vegetable or soy patty -- 1 has 3.4g of fiber.  Whole-wheat bread -- 1 slice has 2g of fiber.  Whole-wheat spaghetti --  cup has 3.2g of fiber. Low-fiber foods Low-fiber foods contain less than 1 gram (less than 1g) of fiber per serving. They include:  Egg -- 1 large.  Flour tortilla -- one 6-inch (15-cm) tortilla.  Fruit juice --  cup.  Lettuce -- 1 cup.  Meat, poultry, or fish -- 1 oz.  Milk -- 1 cup.  Spinach (raw) -- 1 cup.  White bread -- 1 slice.  White rice --  cup.  Yogurt --  cup. Actual amounts of fiber in foods may be different depending on processing. Talk with your dietitian about how much fiber you need in your diet. This information is not intended to replace advice given to you by your health care provider. Make sure you discuss any questions you have with your health care provider. Document Revised: 03/09/2016 Document Reviewed: 09/09/2015 Elsevier Patient Education  [place].

## 2019-12-04 ENCOUNTER — Ambulatory Visit (INDEPENDENT_AMBULATORY_CARE_PROVIDER_SITE_OTHER): Payer: BC Managed Care – PPO | Admitting: Urology

## 2019-12-04 ENCOUNTER — Encounter: Payer: Self-pay | Admitting: Urology

## 2019-12-04 ENCOUNTER — Other Ambulatory Visit: Payer: Self-pay

## 2019-12-04 ENCOUNTER — Telehealth: Payer: Self-pay | Admitting: *Deleted

## 2019-12-04 VITALS — BP 154/113 | HR 94 | Ht 59.0 in | Wt 146.0 lb

## 2019-12-04 DIAGNOSIS — R3129 Other microscopic hematuria: Secondary | ICD-10-CM

## 2019-12-04 DIAGNOSIS — R3915 Urgency of urination: Secondary | ICD-10-CM

## 2019-12-04 DIAGNOSIS — R339 Retention of urine, unspecified: Secondary | ICD-10-CM

## 2019-12-04 LAB — BLADDER SCAN AMB NON-IMAGING: Scan Result: 3

## 2019-12-04 MED ORDER — GEMTESA 75 MG PO TABS: 1.0000 | ORAL_TABLET | Freq: Every day | ORAL | 0 refills | Status: DC

## 2019-12-04 NOTE — Progress Notes (Signed)
12/04/2019 12:19 AM   Sara Chapman April 01, 1976 [id number]  Referring provider: Midge Minium, [place] [address] [place],  [place] [address]  Chief Complaint  Patient presents with  . Urinary Retention    HPI: Sara Chapman is a 44 year old female who is status post robotic assisted laparoscopic sigmoid colectomy for recurrent diverticulitis who presents today with difficulty with urination.  She underwent her colectomy on 11/19/2019.  Dr. Erlene Quan was asked to instill ICG into her left renal collecting system to assist in identifying the ureter during the procedure.    The Saturday after her surgery, she started to develop symptoms of  urinary urgency, straining to urinate and pain with urination.  She states she feels as intense urge to void and has to bear down and lean forward to produce urine.  The pain is so intense during this that beads of sweat form on her forehead.  She is also having anal leakage.  She has a CT scan scheduled for tomorrow.  Her urine is positive for greater than 30 RBCs.  Her PVR is 3 cc.  PMH: Past Medical History:  Diagnosis Date  . Anemia    H/O  . Anxiety   . Bowel perforation ([place])   . Complication of anesthesia    WOKE UP DURING PLACEMENT OF JP DRAIN AND WAS IN SEVERE PAIN FEELING DRAIN PLACEMENT  . GERD (gastroesophageal reflux disease)   . Headache    MIGRAINES  . History of kidney stones    X1  . Hypertension   . PONV (postoperative nausea and vomiting)    WITH C-SECTION  . Psoriatic arthritis ([place])   . Sleep apnea    CPAP  . Stomach ulcer     Surgical History: Past Surgical History:  Procedure Laterality Date  . CESAREAN SECTION     X2  . CYSTOSCOPY N/A 11/19/2019   Procedure: CYSTOSCOPY;  Surgeon: Hollice Espy, MD;  Location: [facility] ORS;  Service: Urology;  Laterality: N/A;    Home Medications:  Allergies as of 12/04/2019      Reactions   Clonazepam Other (See Comments)   SUICIDAL THOUGHTS   Ibuprofen Other (See Comments)   stomach ulcers   Nsaids Other (See Comments)   GI ulcers   Tolmetin Other (See Comments)   GI ulcers   Pseudoephedrine Hcl Palpitations, Other (See Comments)      Tape Rash   Paper tape breaks down the skin and causes sores      Medication List       Accurate as of Dec 04, 2019 11:59 PM. If you have any questions, ask your nurse or doctor.        STOP taking these medications   sodium chloride flush 0.9 % Soln Commonly known as: NS Stopped by: Marrianne Sica, PA-C     TAKE these medications   amLODipine 5 MG tablet Commonly known as: NORVASC Take 1 tablet (5 mg total) by mouth daily. What changed: when to take this   cyclobenzaprine 5 MG tablet Commonly known as: FLEXERIL Take 1 tablet (5 mg total) by mouth 3 (three) times daily as needed for muscle spasms.   Gemtesa 75 MG Tabs Generic drug: Vibegron Take 1 tablet by mouth daily. Started by: Zara Council, PA-C   multivitamin tablet Take 1 tablet by mouth daily.   omeprazole 40 MG capsule Commonly known as: PRILOSEC Take 40 mg by mouth 2 (two) times daily.   ondansetron 4 MG  disintegrating tablet Commonly known as: ZOFRAN-ODT Take 1 tablet (4 mg total) by mouth every 6 (six) hours as needed for nausea.   topiramate 50 MG tablet Commonly known as: TOPAMAX Take 1 tablet (50 mg total) by mouth 2 (two) times daily.   traMADol 50 MG tablet Commonly known as: ULTRAM Take 1 tablet (50 mg total) by mouth every 8 (eight) hours as needed for severe pain.   VITAMIN C PO Take 1 tablet by mouth daily. GUMMY   VITAMIN D PO Take 1 tablet by mouth daily.       Allergies:  Allergies  Allergen Reactions  . Clonazepam Other (See Comments)    SUICIDAL THOUGHTS    . Ibuprofen Other (See Comments)    stomach ulcers   . Nsaids Other (See Comments)    GI ulcers  . Tolmetin Other (See Comments)    GI ulcers   . Pseudoephedrine Hcl Palpitations and Other (See Comments)        . Tape Rash    Paper tape breaks down the skin and causes sores    Family History: History reviewed. No pertinent family history.  Social History:  reports that she has never smoked. She has never used smokeless tobacco. She reports that she does not drink alcohol or use drugs.  ROS: Pertinent ROS in HPI  Physical Exam: BP (!) 154/113   Pulse 94   Ht 4\' 11"  (1.499 m)   Wt 146 lb (66.2 kg)   LMP 11/19/2019   BMI 29.49 kg/m   Constitutional:  Well nourished. Alert and oriented, No acute distress. HEENT: [place] AT, mask in place.  Trachea midline Cardiovascular: No clubbing, cyanosis, or edema. Respiratory: Normal respiratory effort, no increased work of breathing. GU: No CVA tenderness.  No bladder fullness or masses.  Normal external genitalia, normal pubic hair distribution, no lesions.  Normal urethral meatus, no lesions, no prolapse, no discharge.   No urethral masses, tenderness and/or tenderness.  She experienced intense urgency when palpating her bladder. Pink vagina mucosa, good estrogen effect, no discharge, no lesions, good pelvic support, no cystocele and no rectocele noted.   Neurologic: Grossly intact, no focal deficits, moving all 4 extremities. Psychiatric: Normal mood and affect.   Laboratory Data: Lab Results  Component Value Date   WBC 5.8 11/24/2019   HGB 10.2 (L) 11/24/2019   HCT 29.9 (L) 11/24/2019   MCV 88.7 11/24/2019   PLT 260 11/24/2019    Lab Results  Component Value Date   CREATININE 1.08 (H) 11/24/2019    Lab Results  Component Value Date   HGBA1C 4.7 (L) 11/17/2019    Lab Results  Component Value Date   AST 14 (L) 10/26/2019   Lab Results  Component Value Date   ALT 19 10/26/2019     Urinalysis Component     Latest Ref Rng & Units 12/04/2019  Specific Gravity, UA     1.005 - 1.030 1.020  pH, UA     5.0 - 7.5 6.0  Color, UA     Yellow Yellow  Appearance Ur     Clear Cloudy (A)  Leukocytes,UA     Negative Negative   Protein,UA     Negative/Trace 1+ (A)  Glucose, UA     Negative Negative  Ketones, UA     Negative Negative  RBC, UA     Negative 3+ (A)  Bilirubin, UA     Negative Negative  Urobilinogen, Ur     0.2 - 1.0 mg/dL  0.2  Nitrite, UA     Negative Negative  Microscopic Examination      See below:   Component     Latest Ref Rng & Units 12/04/2019          WBC, UA     0 - 5 /hpf 0-5  RBC     0 - 2 /hpf >30 (A)  Epithelial Cells (non renal)     0 - 10 /hpf 0-10  Casts     None seen /lpf Present (A)  Cast Type     N/A Hyaline casts  Bacteria, UA     None seen/Few None seen  Yeast, UA     None seen Present (A)   I have reviewed the labs.   Pertinent Imaging:  Results for WENDA, VANSCHAICK (MRN [id number]) as of 12/05/2019 00:08  Ref. Range 12/04/2019 15:06  Scan Result Unknown 3     Assessment & Plan:    1. Urinary urgency - Urinalysis, Complete - Bladder Scan (Post Void Residual) in office - Given Gemtesa 75 mg daily, #7 samples given  2. Microscopic hematuria -Likely secondary to ICG installation surgery - CULTURE, URINE COMPREHENSIVE  Return for Follow-up pending CT scan and urine culture results.  These notes generated with voice recognition software. I apologize for typographical errors.  Michiel Cowboy, PA-C  [facility] [address]  [address] [place], [place] [address] [phone]

## 2019-12-04 NOTE — Telephone Encounter (Signed)
Patient called and stated that she is constipated, stomach hurts. She doesn't really think that it may be something else going on that's causing this. She stated that her skin is so raw from passing gas and clear liquid come out. She also stated that she has to squeeze really hard to urinate. Please call and advise

## 2019-12-04 NOTE — Telephone Encounter (Signed)
Dr Claudine Mouton spoke with Dr [name] and she will see the patient in office at Urology today. Patient notified and is on her way over now.

## 2019-12-04 NOTE — Telephone Encounter (Signed)
Patient called concerned about possible constipation and possible bowel blockage. She reports that she is going to the bathroom about every 30 minutes and each time having a small amount of loose stool about pudding consistency. She has been getting very raw feeling with the skin around her anus with the frequent bowel movements. She also reports that sometimes when she has gas she has a small amount of liquid pass and this causes a lot of burning around her anus. She still feels the need to push a lot with urinating and has been increasing her fluid intake. She is currently scheduled for a CT abdomen and pelvis tomorrow morning.  Message sent to Dr Claudine Mouton to advise patient.

## 2019-12-05 ENCOUNTER — Ambulatory Visit
Admission: RE | Admit: 2019-12-05 | Discharge: 2019-12-05 | Disposition: A | Discharge status: Home / Self Care | Payer: BC Managed Care – PPO | Source: Ambulatory Visit | Attending: Surgery | Admitting: Surgery

## 2019-12-05 DIAGNOSIS — R1084 Generalized abdominal pain: Secondary | ICD-10-CM | POA: Insufficient documentation

## 2019-12-05 DIAGNOSIS — K5720 Diverticulitis of large intestine with perforation and abscess without bleeding: Secondary | ICD-10-CM | POA: Insufficient documentation

## 2019-12-05 LAB — MICROSCOPIC EXAMINATION
Bacteria, UA: NONE SEEN
RBC, Urine: >30 /hpf — AB (ref 0–2)

## 2019-12-05 LAB — URINALYSIS, COMPLETE
Bilirubin, UA: NEGATIVE
Glucose, UA: NEGATIVE
Ketones, UA: NEGATIVE
Leukocytes,UA: NEGATIVE
Nitrite, UA: NEGATIVE
Specific Gravity, UA: 1.02 (ref 1.005–1.030)
Urobilinogen, Ur: 0.2 mg/dL (ref 0.2–1.0)
pH, UA: 6 (ref 5.0–7.5)

## 2019-12-05 MED ORDER — IOHEXOL 300 MG/ML  SOLN
100.0000 mL | Freq: Once | INTRAMUSCULAR | Status: AC | PRN
  Administered 2019-12-05: 100 mL via INTRAVENOUS

## 2019-12-09 ENCOUNTER — Telehealth: Payer: Self-pay | Admitting: Family Medicine

## 2019-12-09 LAB — CULTURE, URINE COMPREHENSIVE

## 2019-12-09 NOTE — Telephone Encounter (Signed)
-----   Message from Harle Battiest, PA-C sent at 12/09/2019  8:20 AM EDT ----- Would you reach out to Mrs. Sek and see if the Gemtesa samples are helping with her bladder symptoms?

## 2019-12-09 NOTE — Telephone Encounter (Signed)
Spoke to patient and she states the Sara Chapman is not helping at all. She states she is in so much pain when she wipes she shakes from head to toe. She states none of the doctor's know what is going on and she was told to see a Lawyer. She plans to call and schedule an appointment.

## 2019-12-10 NOTE — Telephone Encounter (Signed)
LMOM informed patient of normal urine culture results. Explained she could be having pain from wiping so often as she has urinary frequency. She is to contact our office if she has any questions.

## 2019-12-10 NOTE — Telephone Encounter (Signed)
-----   Message from Harle Battiest, PA-C sent at 12/09/2019  3:50 PM EDT ----- Please let Sara Chapman know that her urine culture was negative for infection, it only grew out normal bacteria.  Has she been making frequent trips to the restroom and wiping herself frequency with toilet paper?  This may be causing the pain when she wipes.

## 2019-12-16 ENCOUNTER — Encounter: Payer: Self-pay | Admitting: Surgery

## 2019-12-16 ENCOUNTER — Ambulatory Visit (INDEPENDENT_AMBULATORY_CARE_PROVIDER_SITE_OTHER): Payer: Self-pay | Admitting: Surgery

## 2019-12-16 ENCOUNTER — Other Ambulatory Visit: Payer: Self-pay

## 2019-12-16 ENCOUNTER — Ambulatory Visit (INDEPENDENT_AMBULATORY_CARE_PROVIDER_SITE_OTHER): Payer: BC Managed Care – PPO | Admitting: Urology

## 2019-12-16 ENCOUNTER — Encounter: Payer: Self-pay | Admitting: Urology

## 2019-12-16 VITALS — BP 148/88 | HR 114 | Ht 59.0 in | Wt 145.0 lb

## 2019-12-16 VITALS — BP 146/110 | HR 108 | Temp 96.6°F | Resp 12 | Wt 145.8 lb

## 2019-12-16 DIAGNOSIS — R3915 Urgency of urination: Secondary | ICD-10-CM

## 2019-12-16 DIAGNOSIS — R3129 Other microscopic hematuria: Secondary | ICD-10-CM

## 2019-12-16 DIAGNOSIS — Z9049 Acquired absence of other specified parts of digestive tract: Secondary | ICD-10-CM

## 2019-12-16 LAB — BLADDER SCAN AMB NON-IMAGING: Scan Result: 75

## 2019-12-16 MED ORDER — MIRABEGRON ER 25 MG PO TB24: 25.0000 mg | ORAL_TABLET | Freq: Every day | ORAL | 0 refills | Status: DC

## 2019-12-16 NOTE — Patient Instructions (Signed)
Dr.Rodenberg discussed with patient that she may follow up in three months with an office visit or telephone to touch base with patient.   Follow-up with our office as needed.  Please call and ask to speak with a nurse if you develop questions or concerns.

## 2019-12-16 NOTE — Progress Notes (Signed)
[facility] POST-OP OFFICE VISIT  12/16/2019  HPI: Sara Chapman is a 44 y.o. female 28 days s/p robotic nice sigmoid colectomy.  She finally stopped her MiraLAX and went 2 days without a bowel movement which was improvement.  This was last week.  She complains of lower back pain with urinary frequency and not feeling as though she fully empties her bladder.  She is seeing urology again today and reports to me that the medication that she try was not helpful with decreasing her bladder spasms.  She reports her but blood pressure is up, and her PCP recommended pelvic floor therapy and is setting that up. Of recent she has been extremely careful with her diet, noting more flatus and eating more beans.  Getting natural sources of fiber without supplementing.  Although she avoids 30 to 45 minutes intervals she actually only has bowel movements 2-3 times a day.  They are small caliber, but not difficult to move.  She reports her urine is clear.   She has been avoiding peeling's or vegetable fiber for some reason, perhaps a slight phobia from her prior illness. She reports overall she is 1000 times better than she was.  Vital signs: BP (!) 146/110   Pulse (!) 108   Temp (!) 96.6 F (35.9 C)   Resp 12   Wt 145 lb 12.8 oz (66.1 kg)   LMP 11/19/2019   SpO2 98%   BMI 29.45 kg/m    Physical Exam: Constitutional: Appears well. Abdomen: Soft and nontender. Skin: Well-healed, intact.  Assessment/Plan: This is a 44 y.o. female 28 days s/p nice robotic sigmoid colectomy.  Patient Active Problem List   Diagnosis Date Noted  . S/P laparoscopic colectomy 12/01/2019  . Essential hypertension   . Chronic nonintractable headache   . 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HCC)   . Psoriatic arthritis (HCC)   . Acute diverticulitis 10/18/2019  . Sepsis (HCC) 10/18/2019  . Diverticulitis large intestine 09/16/2019  . Colonic diverticular abscess 09/03/2019    -I encouraged her to  continue a regimen of fiber supplementation, to ensure regularity.  I do not believe she will ever go back to MiraLAX.  I believe she is on the right track with pursuing the pelvic floor therapies, and she is optimistic that further urological visits may be helpful as that seems to be the primary issue currently.  We will be glad to see her back in office or touch base over the phone in about 3 months or as needed.   Campbell Lerner M.D., FACS 12/16/2019, 10:21 AM

## 2019-12-17 ENCOUNTER — Ambulatory Visit: Payer: Self-pay | Admitting: Urology

## 2019-12-17 LAB — MICROSCOPIC EXAMINATION

## 2019-12-17 LAB — URINALYSIS, COMPLETE
Bilirubin, UA: NEGATIVE
Glucose, UA: NEGATIVE
Ketones, UA: NEGATIVE
Leukocytes,UA: NEGATIVE
Nitrite, UA: NEGATIVE
Specific Gravity, UA: 1.02 (ref 1.005–1.030)
Urobilinogen, Ur: 0.2 mg/dL (ref 0.2–1.0)
pH, UA: 5.5 (ref 5.0–7.5)

## 2019-12-23 ENCOUNTER — Telehealth: Payer: Self-pay | Admitting: Family Medicine

## 2019-12-23 LAB — MYCOPLASMA / UREAPLASMA CULTURE
Mycoplasma hominis Culture: NEGATIVE
Ureaplasma urealyticum: NEGATIVE

## 2019-12-23 NOTE — Telephone Encounter (Signed)
-----   Message from Harle Battiest, PA-C sent at 12/23/2019  8:06 AM EDT ----- Please let Mrs. Hergert know that her cultures for mycoplasma were negative.

## 2019-12-23 NOTE — Telephone Encounter (Signed)
Patient notified and voiced understanding.

## 2019-12-29 NOTE — Progress Notes (Signed)
12/16/2019 4:23 PM   Sara Chapman 1975-10-23 [id number]  Referring provider: Cathie Hoops, PA [address] [place],  [place] [address]  Chief Complaint  Patient presents with  . Urinary Urgency    HPI: Sara Chapman is a 44 year old female who is status post robotic assisted laparoscopic sigmoid colectomy for recurrent diverticulitis who presents today with difficulty with urination.  She underwent her colectomy on 11/19/2019.  Dr. Apolinar Junes was asked to instill ICG into her left renal collecting system to assist in identifying the ureter during the procedure.    The Saturday after her surgery, she started to develop symptoms of urinary urgency, straining to urinate and pain with urination.  She states she feels as intense urge to void and has to bear down and lean forward to produce urine.  The pain is so intense during this that beads of sweat form on her forehead.  She is also having anal leakage.  Contrast CT 12/05/2019 Interval postsurgical changes from sigmoid colectomy. No residual abdominopelvic fluid collection or abscess.  No evidence of obstruction or acute bowel inflammation.  Small amount of air within the subcutaneous fat of the anterior abdominal wall and pelvis, which may be residua of prior laparoscopic procedure. No surrounding induration or fluid to suggest infection.  Urine culture from Dec 04, 2019 contain mixed urogenital flora.  She continues to have her symptoms of urinary urgency and having to strain to urinate.  Patient denies any modifying or aggravating factors.  Patient denies any gross hematuria, dysuria or suprapubic/flank pain.  Patient denies any fevers, chills, nausea or vomiting.    Her UA today was positive for 11-30 RBCs, but she was on her menstrual cycle today.  Her PVR is 75 mL.   PMH: Past Medical History:  Diagnosis Date  . Anemia    H/O  . Anxiety   . Bowel perforation (HCC)   . Complication of anesthesia    WOKE UP DURING PLACEMENT OF JP DRAIN AND WAS IN SEVERE PAIN FEELING DRAIN PLACEMENT  . GERD (gastroesophageal reflux disease)   . Headache    MIGRAINES  . History of kidney stones    X1  . Hypertension   . PONV (postoperative nausea and vomiting)    WITH C-SECTION  . Psoriatic arthritis (HCC)   . Sleep apnea    CPAP  . Stomach ulcer     Surgical History: Past Surgical History:  Procedure Laterality Date  . CESAREAN SECTION     X2  . CYSTOSCOPY N/A 11/19/2019   Procedure: CYSTOSCOPY;  Surgeon: [name], MD;  Location: [facility] ORS;  Service: Urology;  Laterality: N/A;    Home Medications:  Allergies as of 12/16/2019      Reactions   Clonazepam Other (See Comments)   SUICIDAL THOUGHTS   Ibuprofen Other (See Comments)   stomach ulcers   Nsaids Other (See Comments)   GI ulcers   Tolmetin Other (See Comments)   GI ulcers   Pseudoephedrine Hcl Palpitations, Other (See Comments)      Tape Rash   Paper tape breaks down the skin and causes sores      Medication List       Accurate as of Dec 16, 2019 11:59 PM. If you have any questions, ask your nurse or doctor.        amLODipine 5 MG tablet Commonly known as: NORVASC Take 1 tablet (5 mg total) by mouth daily. What changed: when to take  this   amLODipine 10 MG tablet Commonly known as: NORVASC Take by mouth. What changed: Another medication with the same name was changed. Make sure you understand how and when to take each.   cyclobenzaprine 5 MG tablet Commonly known as: FLEXERIL Take 1 tablet (5 mg total) by mouth 3 (three) times daily as needed for muscle spasms.   Gemtesa 75 MG Tabs Generic drug: Vibegron Take 1 tablet by mouth daily.   mirabegron ER 25 MG Tb24 tablet Commonly known as: MYRBETRIQ Take 1 tablet (25 mg total) by mouth daily. Started by: Zara Council, PA-C   multivitamin tablet Take 1 tablet by mouth daily.   omeprazole 40 MG capsule Commonly known as: PRILOSEC Take 40 mg by mouth  2 (two) times daily.   ondansetron 4 MG disintegrating tablet Commonly known as: ZOFRAN-ODT Take 1 tablet (4 mg total) by mouth every 6 (six) hours as needed for nausea.   topiramate 50 MG tablet Commonly known as: TOPAMAX Take 1 tablet (50 mg total) by mouth 2 (two) times daily.   traMADol 50 MG tablet Commonly known as: ULTRAM Take 1 tablet (50 mg total) by mouth every 8 (eight) hours as needed for severe pain.   VITAMIN C PO Take 1 tablet by mouth daily. GUMMY   VITAMIN D PO Take 1 tablet by mouth daily.       Allergies:  Allergies  Allergen Reactions  . Clonazepam Other (See Comments)    SUICIDAL THOUGHTS    . Ibuprofen Other (See Comments)    stomach ulcers   . Nsaids Other (See Comments)    GI ulcers  . Tolmetin Other (See Comments)    GI ulcers   . Pseudoephedrine Hcl Palpitations and Other (See Comments)       . Tape Rash    Paper tape breaks down the skin and causes sores    Family History: History reviewed. No pertinent family history.  Social History:  reports that she has never smoked. She has never used smokeless tobacco. She reports that she does not drink alcohol or use drugs.  ROS: Pertinent ROS in HPI  Physical Exam: BP (!) 148/88   Pulse (!) 114   Ht 4\' 11"  (1.499 m)   Wt 145 lb (65.8 kg)   LMP 11/19/2019   BMI 29.29 kg/m   Constitutional:  Well nourished. Alert and oriented, No acute distress. HEENT: [place] AT, mask in place.  Trachea midline Cardiovascular: No clubbing, cyanosis, or edema. Respiratory: Normal respiratory effort, no increased work of breathing. GI: Abdomen is soft, non tender, non distended, no abdominal masses.  No hernias appreciated.  Stool sample for occult testing is not indicated.   GU: No CVA tenderness.  No bladder fullness or masses.  Normal external genitalia, normal pubic hair distribution, no lesions.  Normal urethral meatus, no lesions, no prolapse, no discharge.   No urethral masses, tenderness and/or  tenderness. No bladder fullness, tenderness or masses.  Pink vagina mucosa, good estrogen effect, no discharge, no lesions, good pelvic support, no cystocele and no rectocele noted.  No cervical motion tenderness.  Uterus is freely mobile and non-fixed.  No adnexal/parametria masses or tenderness noted.  Anus and perineum are without rashes or lesions.    Skin: No rashes, bruises or suspicious lesions. Lymph: No inguinal adenopathy. Neurologic: Grossly intact, no focal deficits, moving all 4 extremities. Psychiatric: Normal mood and affect.   Laboratory Data: Lab Results  Component Value Date   WBC 5.8 11/24/2019  HGB 10.2 (L) 11/24/2019   HCT 29.9 (L) 11/24/2019   MCV 88.7 11/24/2019   PLT 260 11/24/2019    Lab Results  Component Value Date   CREATININE 1.08 (H) 11/24/2019    Lab Results  Component Value Date   HGBA1C 4.7 (L) 11/17/2019    Lab Results  Component Value Date   AST 14 (L) 10/26/2019   Lab Results  Component Value Date   ALT 19 10/26/2019     Urinalysis Component     Latest Ref Rng & Units 12/16/2019  Specific Gravity, UA     1.005 - 1.030 1.020  pH, UA     5.0 - 7.5 5.5  Color, UA     Yellow Yellow  Appearance Ur     Clear Hazy (A)  Leukocytes,UA     Negative Negative  Protein,UA     Negative/Trace 2+ (A)  Glucose, UA     Negative Negative  Ketones, UA     Negative Negative  RBC, UA     Negative 3+ (A)  Bilirubin, UA     Negative Negative  Urobilinogen, Ur     0.2 - 1.0 mg/dL 0.2  Nitrite, UA     Negative Negative  Microscopic Examination      See below:   Component     Latest Ref Rng & Units 12/16/2019          WBC, UA     0 - 5 /hpf 0-5  RBC     0 - 2 /hpf 11-30 (A)  Epithelial Cells (non renal)     0 - 10 /hpf 0-10  Renal Epithel, UA     None seen /hpf 0-10 (A)  Casts     None seen /lpf Present (A)  Cast Type     N/A Granular casts (A)  Bacteria, UA     None seen/Few Few    I have reviewed the labs.   Pertinent  Imaging: Results for EVELEEN, MCNEAR (MRN [id number]) as of 12/29/2019 16:20  Ref. Range 12/16/2019 15:27  Scan Result Unknown 75      Assessment & Plan:    1. Urinary urgency - Urinalysis, Complete - Bladder Scan (Post Void Residual) in office - mycoplasma/ureaplasma cultures - not likely positive, but performed for completeness sake - continue Myrbetriq samples  2. Microscopic hematuria - patient on menses  Return for pending mycoplasma/ureaplasma cultures .  These notes generated with voice recognition software. I apologize for typographical errors.  Michiel Cowboy, PA-C  [facility] [address]  [address] [place], [place] [address] [phone]

## 2020-03-18 ENCOUNTER — Other Ambulatory Visit: Payer: Self-pay

## 2020-03-18 ENCOUNTER — Telehealth: Payer: BC Managed Care – PPO | Admitting: Surgery

## 2020-03-18 ENCOUNTER — Ambulatory Visit: Payer: BC Managed Care – PPO | Admitting: Surgery

## 2020-04-23 ENCOUNTER — Inpatient Hospital Stay
Admission: RE | Admit: 2020-04-23 | Discharge: 2020-04-23 | Disposition: A | Discharge status: Home / Self Care | Payer: Self-pay | Source: Ambulatory Visit | Attending: *Deleted | Admitting: *Deleted

## 2020-04-23 ENCOUNTER — Other Ambulatory Visit: Payer: Self-pay | Admitting: *Deleted

## 2020-04-23 DIAGNOSIS — Z1231 Encounter for screening mammogram for malignant neoplasm of breast: Secondary | ICD-10-CM

## 2020-11-27 IMAGING — CT CT IMAGE GUIDED DRAINAGE BY PERCUTANEOUS CATHETER
1 of 10 series · 8 of 32 positions shown, 13 images · non-contrast
Comparison: none

CLINICAL DATA: Ruptured sigmoid diverticulitis with predominantly
air filled abscess formation adjacent to the proximal sigmoid colon
and development of second abscess more centrally in the midline
lower abdomen. The patient presents for percutaneous catheter
drainage of the abscesses.

[Series 2: i-spiral 5.0 b30f · axial · 0.78mm/px · z∈[-198,-69]mm · 8 of 49 slices shown, 13 images]
[im 6/49  soft-tissue]
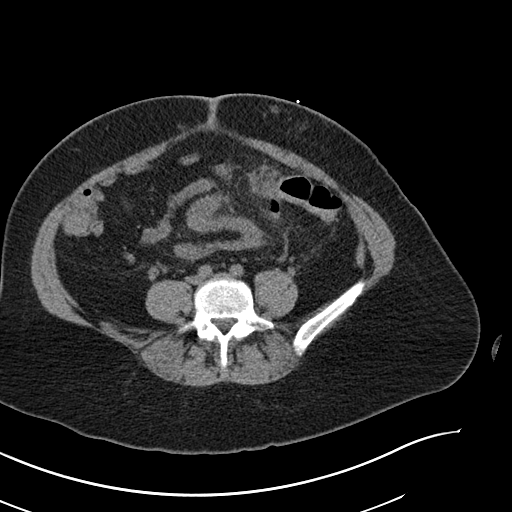
[im 6/49  bone]
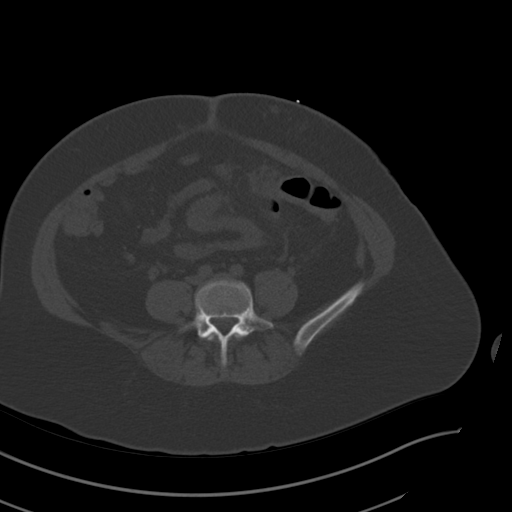
[im 11/49  soft-tissue]
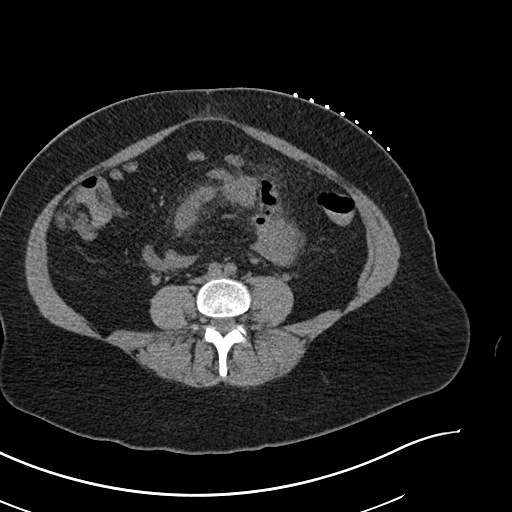
[im 17/49  soft-tissue]
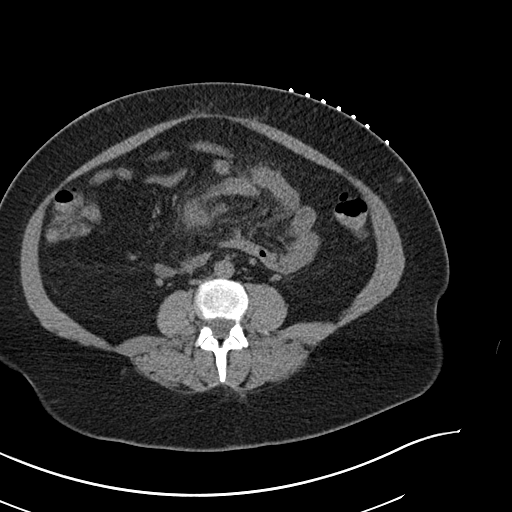
[im 22/49  soft-tissue]
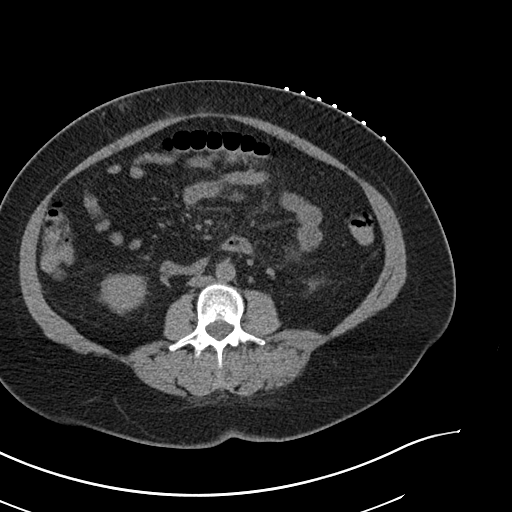
[im 27/49  soft-tissue]
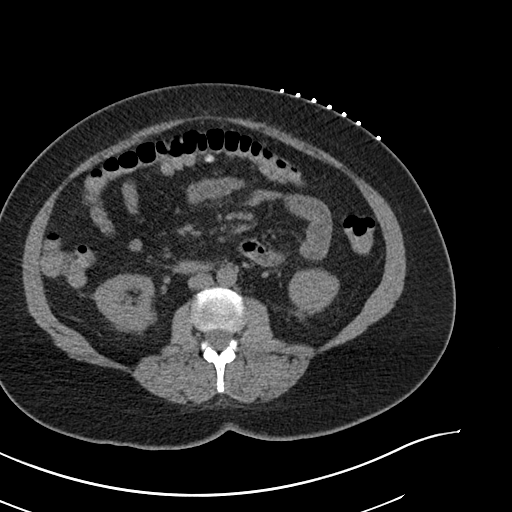
[im 27/49  lung]
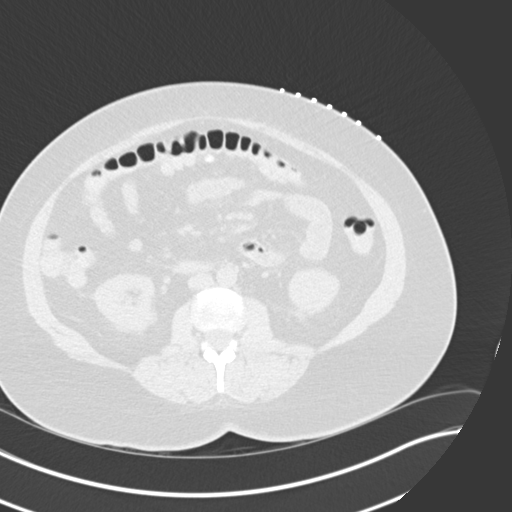
[im 33/49  soft-tissue]
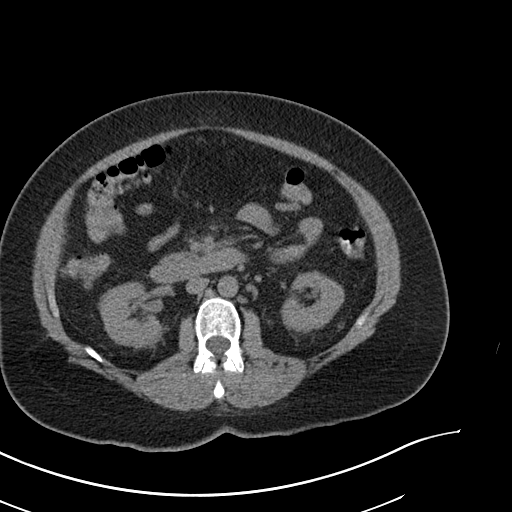
[im 33/49  lung]
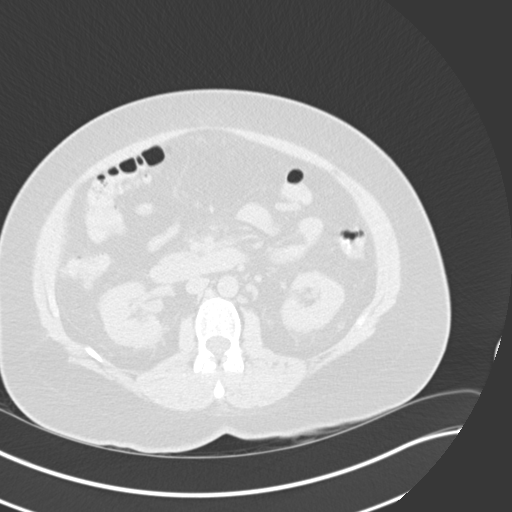
[im 38/49  soft-tissue]
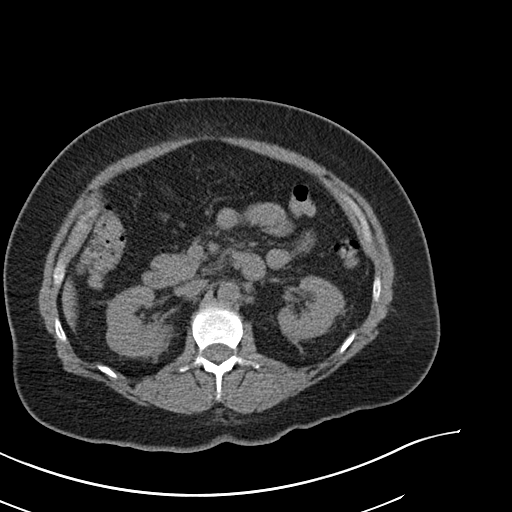
[im 38/49  lung]
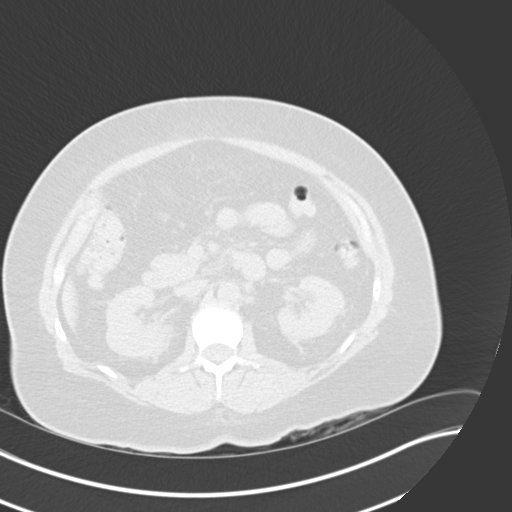
[im 43/49  soft-tissue]
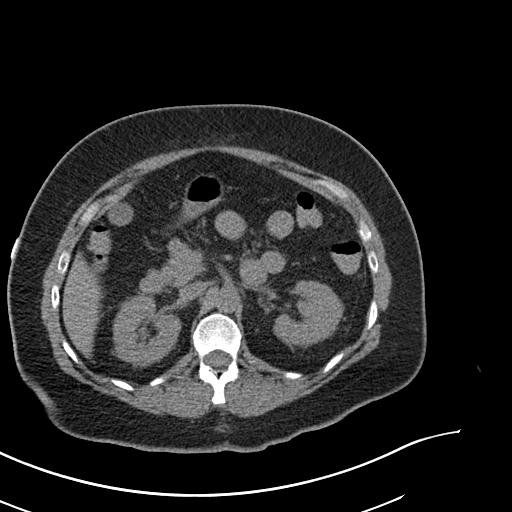
[im 43/49  lung]
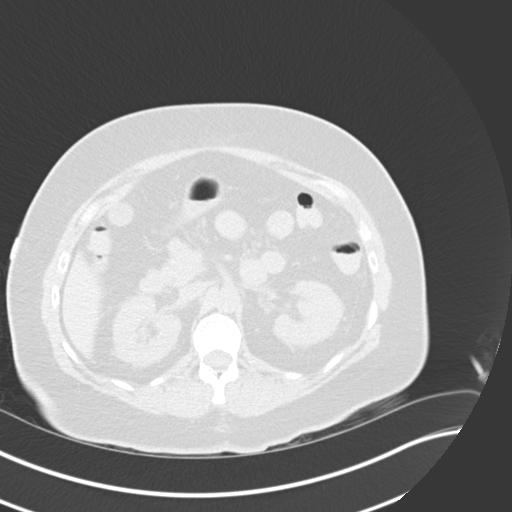

[8 of 32 positions shown; findings below may reference images not displayed]

EXAM:
1. CT GUIDED PERCUTANEOUS CATHETER DRAINAGE OF LEFT LOWER QUADRANT
DIVERTICULAR PERITONEAL ABSCESS
2. CT-GUIDED PERCUTANEOUS CATHETER DRAINAGE OF MIDLINE ABDOMINAL
PERITONEAL ABSCESS

ANESTHESIA/SEDATION:
2.0 mg IV Versed 100 mcg IV Fentanyl

Total Moderate Sedation Time: 49 minutes

The patient's level of consciousness and physiologic status were
continuously monitored during the procedure by Radiology nursing.

PROCEDURE:
The procedure, risks, benefits, and alternatives were explained to
the patient. Questions regarding the procedure were encouraged and
answered. The patient understands and consents to the procedure. A
time out was performed prior to initiating the procedure.

CT was performed in a supine position through the lower abdomen and
pelvis. The anterior abdominal wall was prepped with chlorhexidine
in a sterile fashion, and a sterile drape was applied covering the
operative field. A sterile gown and sterile gloves were used for the
procedure. Local anesthesia was provided with 1% Lidocaine.

Under CT guidance, 2 separate 18 gauge trocar needles were advanced
to the level of 2 separate abscess cavities. After advancing needles
into both a left lower quadrant diverticular abscess and a more
midline central mesenteric abdominal abscess, fluid was aspirated,
guidewires advanced and the tracts dilated to 10 French. Ten French
percutaneous drains were then advanced over the guidewires and
formed. Drainage catheter position was confirmed by CT. The drains
were flushed with sterile saline, connected to suction bulbs and
secured at the skin with Prolene retention sutures and StatLock
devices. Aspirated fluid samples were sent for culture analysis from
both collections.

COMPLICATIONS:
None
FINDINGS: The predominantly air-filled diverticular abscess lying superior to
the proximal sigmoid colon yielded bloody fluid containing debris.
The drainage catheter was able to be formed in the cavity resulting
[REDACTED]ompression after attachment to a suction bulb.

The more midline peritoneal abscess yielded thinner appearing bloody
fluid. A drainage catheter was able to be formed in this collection.
IMPRESSION: Separate CT-guided percutaneous catheter drainage of 2 separate
abscesses related to sigmoid diverticulitis. Ten French drainage
catheters were placed in a left lower quadrant collection containing
predominantly air lying superior to the proximal sigmoid colon and a
separate central mesenteric lower abdominal abscess. Both
collections yielded bloody fluid with separate sample sent for
culture analysis. Both drains were attached to suction bulbs.

## 2021-04-29 ENCOUNTER — Encounter: Payer: Self-pay | Admitting: General Surgery

## 2021-08-30 ENCOUNTER — Emergency Department: Payer: BC Managed Care – PPO

## 2021-08-30 ENCOUNTER — Emergency Department
Admission: EM | Admit: 2021-08-30 | Discharge: 2021-08-30 | Disposition: A | Discharge status: Home / Self Care | Payer: BC Managed Care – PPO | Attending: Emergency Medicine | Admitting: Emergency Medicine

## 2021-08-30 ENCOUNTER — Encounter: Payer: Self-pay | Admitting: Emergency Medicine

## 2021-08-30 ENCOUNTER — Other Ambulatory Visit: Payer: Self-pay

## 2021-08-30 DIAGNOSIS — I10 Essential (primary) hypertension: Secondary | ICD-10-CM | POA: Diagnosis not present

## 2021-08-30 DIAGNOSIS — M25511 Pain in right shoulder: Secondary | ICD-10-CM | POA: Insufficient documentation

## 2021-08-30 DIAGNOSIS — R072 Precordial pain: Secondary | ICD-10-CM | POA: Diagnosis not present

## 2021-08-30 LAB — CBC
HCT: 42.3 % (ref 36.0–46.0)
Hemoglobin: 14.3 g/dL (ref 12.0–15.0)
MCH: 31.8 pg (ref 26.0–34.0)
MCHC: 33.8 g/dL (ref 30.0–36.0)
MCV: 94 fL (ref 80.0–100.0)
Platelets: 269 10*3/uL (ref 150–400)
RBC: 4.5 MIL/uL (ref 3.87–5.11)
RDW: 12.5 % (ref 11.5–15.5)
WBC: 6.5 10*3/uL (ref 4.0–10.5)
nRBC: 0 % (ref 0.0–0.2)

## 2021-08-30 LAB — BASIC METABOLIC PANEL
Anion gap: 9 (ref 5–15)
BUN: 10 mg/dL (ref 6–20)
CO2: 23 mmol/L (ref 22–32)
Calcium: 8.6 mg/dL — ABNORMAL LOW (ref 8.9–10.3)
Chloride: 105 mmol/L (ref 98–111)
Creatinine, Ser: 0.97 mg/dL (ref 0.44–1.00)
GFR, Estimated: >60 mL/min (ref >60)
Glucose, Bld: 122 mg/dL — ABNORMAL HIGH (ref 70–99)
Potassium: 3.1 mmol/L — ABNORMAL LOW (ref 3.5–5.1)
Sodium: 137 mmol/L (ref 135–145)

## 2021-08-30 LAB — TROPONIN I (HIGH SENSITIVITY): Troponin I (High Sensitivity): <2 ng/L (ref <18)

## 2021-08-30 MED ORDER — OXYCODONE-ACETAMINOPHEN 5-325 MG PO TABS
1.0000 | ORAL_TABLET | Freq: Once | ORAL | Status: AC
  Administered 2021-08-30: 1 via ORAL
  Filled 2021-08-30: qty 1

## 2021-08-30 MED ORDER — ONDANSETRON 4 MG PO TBDP
4.0000 mg | ORAL_TABLET | Freq: Once | ORAL | Status: AC
  Administered 2021-08-30: 4 mg via ORAL
  Filled 2021-08-30: qty 1

## 2021-08-30 MED ORDER — POTASSIUM CHLORIDE CRYS ER 20 MEQ PO TBCR
40.0000 meq | EXTENDED_RELEASE_TABLET | Freq: Once | ORAL | Status: AC
  Administered 2021-08-30: 40 meq via ORAL
  Filled 2021-08-30: qty 2

## 2021-08-30 NOTE — ED Provider Notes (Signed)
[facility] Provider Note    Event Date/Time   First MD Initiated Contact with Patient 08/30/21 1109     (approximate)   History   Chest Pain   HPI  Tanaysia Bhardwaj is a 46 y.o. female with past medical history of hypertension anemia and anxiety who presents with shoulder pain and chest pain.  Symptoms started last Thursday, approximately 4 days ago.  Started as right shoulder pain when she rolled over in bed.  She then started having some associated chest pain.  Has been coming and going.  Yesterday was not having any pain but then this morning woke up with recurrent shoulder and chest pain.  Describes the pain in the shoulder as gnawing like a toothache and has been relatively constant.  Worse with movement and lifting of the arm.  Chest pain is central and described as sharp stabbing pain.  It is not exertional nor is it pleuritic.  She has no associated shortness of breath diaphoresis, does have some associated nausea. The patient denies hx of prior DVT/PE, unilateral leg pain/swelling, hormone use, recent surgery, hx of cancer, prolonged immobilization, or hemoptysis.      Past Medical History:  Diagnosis Date   Anemia    H/O   Anxiety    Bowel perforation (HCC)    Complication of anesthesia    WOKE UP DURING PLACEMENT OF JP DRAIN AND WAS IN SEVERE PAIN FEELING DRAIN PLACEMENT   GERD (gastroesophageal reflux disease)    Headache    MIGRAINES   History of kidney stones    X1   Hypertension    PONV (postoperative nausea and vomiting)    WITH C-SECTION   Psoriatic arthritis (HCC)    Sleep apnea    CPAP   Stomach ulcer     Patient Active Problem List   Diagnosis Date Noted   S/P laparoscopic colectomy 12/01/2019   Essential hypertension    Chronic nonintractable headache    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HCC)    Psoriatic arthritis (HCC)    Acute diverticulitis 10/18/2019   Sepsis (HCC) 10/18/2019   Diverticulitis large intestine  09/16/2019   Colonic diverticular abscess 09/03/2019     Physical Exam  Triage Vital Signs: ED Triage Vitals  Enc Vitals Group     BP 08/30/21 1005 (!) 160/100     Pulse Rate 08/30/21 1005 99     Resp 08/30/21 1005 17     Temp 08/30/21 1005 97.9 F (36.6 C)     Temp Source 08/30/21 1005 Oral     SpO2 08/30/21 1005 97 %     Weight 08/30/21 1006 145 lb 1 oz (65.8 kg)     Height 08/30/21 1006 4\' 11"  (1.499 m)     Head Circumference --      Peak Flow --      Pain Score 08/30/21 1005 7     Pain Loc --      Pain Edu? --      Excl. in GC? --     Most recent vital signs: Vitals:   08/30/21 1005 08/30/21 1153  BP: (!) 160/100 (!) 148/95  Pulse: 99 74  Resp: 17 18  Temp: 97.9 F (36.6 C)   SpO2: 97% 95%     General: Awake, no distress.  CV:  Good peripheral perfusion.  Resp:  Normal effort.  Abd:  No distention.  Neuro:             Awake, Alert, Oriented  x 3  Other:  Mild tenderness palpation the right trap, pain induced with abduction and internal rotation, mild tenderness in palpation over the chest wall diffusely   ED Results / Procedures / Treatments  Labs (all labs ordered are listed, but only abnormal results are displayed) Labs Reviewed  BASIC METABOLIC PANEL - Abnormal; Notable for the following components:      Result Value   Potassium 3.1 (*)    Glucose, Bld 122 (*)    Calcium 8.6 (*)    All other components within normal limits  CBC  POC URINE PREG, ED  TROPONIN I (HIGH SENSITIVITY)     EKG EKG interpretation performed by myself: NSR, nml axis, nml intervals, no acute ischemic changes    RADIOLOGY I reviewed the CXR which does not show any acute cardiopulmonary process; agree with radiology report     PROCEDURES:  Critical Care performed: No    MEDICATIONS ORDERED IN ED: Medications  potassium chloride SA (KLOR-CON M) CR tablet 40 mEq (40 mEq Oral Given 08/30/21 1157)  ondansetron (ZOFRAN-ODT) disintegrating tablet 4 mg (4 mg Oral Given  08/30/21 1157)  oxyCODONE-acetaminophen (PERCOCET/ROXICET) 5-325 MG per tablet 1 tablet (1 tablet Oral Given 08/30/21 1157)     IMPRESSION / MDM / ASSESSMENT AND PLAN / ED COURSE  I reviewed the triage vital signs and the nursing notes.                              Differential diagnosis includes, but is not limited to, rotator cuff tendinitis, impingement syndrome, ACS, angina  46 year old female with past medical history of hypertension who presents with shoulder pain and chest pain.  Has been coming and going since last Thursday has been relatively constant today.  Pain in her shoulder seems consistent with musculoskeletal pathology and that it is throbbing pain that is worse with lifting the arm and movement.  Chest pain is atypical and that it is sharp stabbing nonexertional.  Vital signs are within normal limits.  I reviewed the EKG which has no acute ischemic changes.  Chest x-ray also without acute process.  On exam patient has significant pain with abduction and internal rotation of the arm, distant with impingement syndrome.  Troponin is undetectable and given that the pain has been going on for more 3 hours do not feel need to repeat.  Suspect primary musculoskeletal pathology.  However given she does have risk factors will refer to cardiology as an outpatient.  Patient was given p.o. potassium for a potassium of 3.1.  Percocet and Zofran given for symptomatic relief.       FINAL CLINICAL IMPRESSION(S) / ED DIAGNOSES   Final diagnoses:  Acute pain of right shoulder     Rx / DC Orders   ED Discharge Orders     None        Note:  This document was prepared using Dragon voice recognition software and may include unintentional dictation errors.   Georga Hacking, MD 08/30/21 1328

## 2021-08-30 NOTE — ED Triage Notes (Signed)
Pt comes into the ED via POV c/o left side chest pain with right side arm/shoulder/back pain.  Pt denies any known injury to the area but states this has been intermittent since last Thursday.  Pt denies any cardiac history other than HTN.  Pt does admit to some nausea since this started.  Pt ambulatory with even and unlabored respirations.

## 2021-08-30 NOTE — ED Notes (Signed)
See triage note  presents with chest pain since last week  states pain has been intermittent  pain moves from left side into right shoulder and back

## 2021-11-21 ENCOUNTER — Other Ambulatory Visit: Payer: Self-pay | Admitting: Surgery

## 2021-11-30 ENCOUNTER — Encounter: Payer: BC Managed Care – PPO | Attending: Surgery | Admitting: Dietician

## 2021-11-30 ENCOUNTER — Encounter: Payer: Self-pay | Admitting: Dietician

## 2021-11-30 VITALS — Ht <= 58 in | Wt 185.9 lb

## 2021-11-30 DIAGNOSIS — I10 Essential (primary) hypertension: Secondary | ICD-10-CM | POA: Diagnosis not present

## 2021-11-30 DIAGNOSIS — Z6838 Body mass index (BMI) 38.0-38.9, adult: Secondary | ICD-10-CM

## 2021-11-30 DIAGNOSIS — Z713 Dietary counseling and surveillance: Secondary | ICD-10-CM | POA: Insufficient documentation

## 2021-11-30 DIAGNOSIS — Z6837 Body mass index (BMI) 37.0-37.9, adult: Secondary | ICD-10-CM | POA: Insufficient documentation

## 2021-11-30 NOTE — Patient Instructions (Addendum)
Gradually reduce drinking with meals. Start by using a smaller glass or avoiding filling glass tully.  ?Keep health options at work for breakfast, lunches and/or snacks. ? ?

## 2021-11-30 NOTE — Progress Notes (Signed)
Nutrition Assessment for Bariatric Surgery   ?Appointment start time: 1330   end time: 1430 ? ?Planned Surgery: Sleeve gastrectomy ? ?Anthropometrics: ?Weight: 185.9lbs Height: 4'10" BMI: 38.85   ?Patient's Goal Weight: 100lbs (BMI 20.5) ? ?Clinical: ?Medical History: resistant HTN, hyperlipidemia, diverticulosis, colectomy 2021, GERd, sleep apnea, hip pain ? ?Medications and Supplements: amLODipine, busPIRone, inFLIXamab, mothotrexate, omeprazole, spironolactone, topiramite, folic acid, magnesium, vitamin C-D-zinc supplement; HbA1C 4.9% ? ?Relevant labs: 09/15/21 total cholesterol 249, LDL 177, HDL 48, triglycerides 120 ? ?Notable symptoms: decreased stamina, out of breath more easily; joint pain/ stiffness, GERD controlled with medication ? ?Drug allergies: ibuprofen/ Nsaids, Tolmetin (GI ulcers); pseudoephedrine (palpitations), colazepam (suicidal thoughts); tape (rash) ?Food allergies: none known ? ?Lifestyle and Dietary History: ? ?Dieting/ weight history:  ?Patient tried weight watchers, nutri system, intermittent fasting exercise/ personal trainer -- with limited/ short term success.  ?Phentermine helped more until COVID shutdown, which affected eating and physical activity ?Does not feel well in general, breathing is affected by excess weight per patient.   ?Unable to eat ice cream or drink milk since having colon resection, can tolerate cottage cheese. ?Sister recently had bariatric surgery and is providing support for patient. ? ?Disordered or emotional eating history:  ?No eating disorder history ?Reports some emotional eating -- stress, celebration, etc.  ? ?Physical activity: some increase in daily steps due to play/ care for granddaughter. Job is sedentary ? ?Dietary Recall:  ?Daily pattern: 3 meals and 1 snacks. ?Dining out: 1-2 meals per week. ?Breakfast: biscuit (due to time); likes oatmeal, grits, cottage cheese, fruit  ?Snack: usually none ?Lunch: sometimes from home-- meat + veg leftovers, some  out -- sushi; salad with grilled chicken; sandwich no chips or fries; chicken wings; likes [place] eats 2-3x a week-- fajita quesadilla but eats filling and not tortilla, limits rice and beans; occasionally chick fila sandwich ?Snack: usually none ?Supper: challenge cooking for 2 -- often sandwich/ grilled chicken/ steak + veg ?Snack: cheese-its crackers and soda -- sometimes this is supper ?Beverages: water, decreasing regular sodas ? ? ?Nutrition Intervention: ?Instructed patient on pre-op diet goals and importance of close adherence to bariatric diet after surgery to avoid side effects and complications.  ?Instructed on importance of permanent diet changes for long term success. ?Discussed stages of the bariatric diet after surgery as well as the importance of adequate protein and fluid intake.  ?Discussed importance of managing stress and food cravings with strategies such as diverting thoughts with non food activities ?Provided overview of 2-week pre-op diet.  ? ?Nutrition Diagnosis: [place]-3.3 Overweight/ obesity related to history of excess calories and inadequate physical activity as evidenced by patient with current BMI of 38.85, making dietary changes to promote weight loss prior to bariatric surgery. ? ?Teaching method(s) utilized: ?Patent attorney ?Auditory ?Hands-on ? ?Materials provided: ?Pre-op Goals ?Diet Stage Template ?Visit summary with goals/ instructions ? ?Learning Readiness: ?Change in progress ? ?Barriers to learning/ implementing lifestyle change: none ? ?Demonstrated degree of understanding via: Teach Back ? ?Summary: ?Patient has begun making diet and lifestyle changes in effort to lose weight, improve health risk and prepare for bariatric surgery.  ?Patient's goal for weight loss is improved energy, stamina, and health risk reduction. ?She has solid support from family and friends who have undergone bariatric surgery.  ?She agrees to work on reducing fluid intake during meals, improving choices for  breakfast and lunches prior to surgery.  ?She is motivated to follow the bariatric diet after surgery.  ?From a nutrition standpoint, she is ready  to proceed with the bariatric surgery program and will be a good candidate for surgery. ? ? ?Plan: ?Patient commits to returning for pre-op class at least 2 weeks prior to surgery.  ?She will plan to return for post-op RD visits beginning 2 weeks after surgery.  ? ? ?

## 2021-12-01 ENCOUNTER — Ambulatory Visit (HOSPITAL_COMMUNITY)
Admission: RE | Admit: 2021-12-01 | Discharge: 2021-12-01 | Disposition: A | Discharge status: Home / Self Care | Payer: BC Managed Care – PPO | Source: Ambulatory Visit | Attending: Surgery | Admitting: Surgery

## 2021-12-15 ENCOUNTER — Ambulatory Visit: Payer: Self-pay | Admitting: Surgery

## 2021-12-15 NOTE — H&P (Signed)
Sara Chapman [id number]   Referring Provider: Self  Subjective   Chief Complaint: Morbid obesity   History of Present Illness:    Following up for further consultation regarding surgical treatment of morbid obesity.  Denies any changes in her health since our first visit last month.  Has several insightful questions to discuss today.  She has completed the bariatric pathway with no barriers identified.  Her vitamin D and B12 levels were low and this has been addressed with her PCP. Her upper GI did demonstrate a small sliding hiatal hernia with Schatzki's ring  Initial visit 11/17/2021: 46 year old woman with a history of sleep apnea, psoriatic arthritis on Remicade, hypertension, kidney stones, headache, GERD, history of ulcer, anxiety, anemia, bladder spasms, diverticular abscess who presents for consultation regarding surgical management of morbid obesity.  This has been a significant struggle for her for the last several years and she has tried numerous methods of weight loss including diet, lifestyle change, multiple medications, with limited and unsustained success.  She is interested in more aggressive treatment of her obesity in order to improve her quality and quantity of life as well as to improve control of associated comorbidities including sleep apnea and hypertension, as well as improved pain control from her arthritic pain.  Her sister is one of our patients who also had a sleeve, and she has several other acquaintances who have had bariatric surgery. She does endorse reflux which is well controlled with daily Prilosec.  abdominal surgery includes C-section and robotic sigmoid colectomy with splenic flexure mobilization by Dr. Christian Mate at [facility] in 2021. She works as a Art gallery manager.    Review of Systems: A complete review of systems was obtained from the patient.  I have reviewed this information and discussed as appropriate with the patient.  See HPI as well for other  ROS.   Medical History: Past Medical History:  Diagnosis Date   Anemia    Bullous myringitis    Colitis, unspecified    (question)   Depression    Diverticulitis of both large and small intestine with perforation without bleeding 2021   GERD (gastroesophageal reflux disease) 03/14/2017   History of nephrolithiasis    Hypertension    Psoriasis    Psoriatic arthritis (CMS-HCC)    a. S/P Methotrexate.  b. S/P Enbrel, lost of efficacy.  c. Humira.   Psoriatic arthritis (CMS-HCC)    PUD (peptic ulcer disease)    Recurrent sinus infections    Sleep apnea     Patient Active Problem List  Diagnosis   Gastric ulcer   Psoriatic arthritis (HCC)   Abnormal Pap smear   Occult blood in stools   Anemia   Ruptured ovarian cyst   Headache, chronic daily   Headache, chronic daily   OSA on CPAP   Generalized anxiety disorder   GERD (gastroesophageal reflux disease)   Chronic migraine without aura without status migrainosus, not intractable   Essential hypertension   History of diverticulitis   Esophagitis, eosinophilic   Odynophagia, unspecified    Past Surgical History:  Procedure Laterality Date   LAPAROSCOPIC TUBAL LIGATION  2004   (with C-section)   COLONOSCOPY  08/23/2009   EGD  08/18/2010   ESOPHAGOGASTRODOUDENOSCOPY W/BIOPSY  03/15/2017   Procedure: ESOPHAGOGASTRODUODENOSCOPY, FLEXIBLE, TRANSORAL; WITH BIOPSY, SINGLE OR MULTIPLE;  Surgeon: Raynelle Jan., MD;  Location: [facility] ENDO/BRON[facility];  Service: Gastroenterology;;   Alcario Drought W/BIOPSY  06/04/2017   Procedure: ESOPHAGOGASTRODUODENOSCOPY, FLEXIBLE, TRANSORAL; WITH BIOPSY, SINGLE OR MULTIPLE;  Surgeon:  Marlane Mingle, MD;  Location: [facility] ENDO/BRON[facility];  Service: Gastroenterology;;   LAPAROSCOPIC COLON RESECTION  2021   COLONOSCOPY N/A 04/14/2021   Procedure: COLORECTAL CANCER SCREENING;  Surgeon: Avie Arenas, MD;  Location: [place];  Service:  Gastroenterology;  Laterality: N/A;   CESAREAN SECTION  1997 and 2004     Allergies  Allergen Reactions   Clonazepam Other (See Comments)    SUICIDAL THOUGHTS   Nsaids (Non-Steroidal Anti-Inflammatory Drug) Other (See Comments)    GI ulcers   Tolmetin Other (See Comments)    GI ulcers   Ibuprofen Other (See Comments)    stomach ulcers   Sudafed [Pseudoephedrine Hcl] Palpitations and Other (See Comments)    Unable to tolerate   Adhesive Tape-Silicones Rash    Paper tape breaks down the skin and causes sores    Current Outpatient Medications on File Prior to Visit  Medication Sig Dispense Refill   amLODIPine (NORVASC) 5 MG tablet TAKE ONE TABLET BY MOUTH DAILY 90 tablet 3   ascorbic acid (VITAMIN C ORAL) Take by mouth        busPIRone (BUSPAR) 10 MG tablet Take 1 tablet (10 mg total) by mouth 2 (two) times daily as needed 60 tablet 6   chlorthalidone 25 MG tablet Take 0.5 tablets (12.5 mg total) by mouth once daily 15 tablet 11   cholecalciferol, vitamin D3, (VITAMIN D3 ORAL) Take by mouth        folic acid (FOLVITE) 1 MG tablet Take 1 tablet (1 mg total) by mouth once daily for 360 days 90 tablet 3   gabapentin (NEURONTIN) 100 MG capsule Take 1 capsule (100 mg total) by mouth nightly (Patient not taking: Reported on 12/15/2021) 30 capsule 2   inFLIXimab (REMICADE) 100 mg injection Inject 5 mg/kg into the vein every 8 (eight) weeks     methotrexate (RHEUMATREX) 2.5 MG tablet Take 4 tablets (10 mg total) by mouth every 7 (seven) days for 90 days 16 tablet 2   pantoprazole (PROTONIX) 40 MG DR tablet Take 1 tablet (40 mg total) by mouth 2 (two) times daily 180 tablet 3   spironolactone (ALDACTONE) 50 MG tablet Take 1 tablet (50 mg total) by mouth once daily 30 tablet 11   topiramate (TOPAMAX) 25 MG tablet Take 1 tablet (25 mg total) by mouth 2 (two) times daily 60 tablet 11   No current facility-administered medications on file prior to visit.    Family History  Problem Relation Age  of Onset   Other Father        liver failure, enlarged heart   Diabetes type II Father    High blood pressure (Hypertension) Father    Bipolar disorder Father    Myocardial Infarction (Heart attack) Father    High blood pressure (Hypertension) Sister    High blood pressure (Hypertension) Paternal Aunt    High blood pressure (Hypertension) Paternal Uncle    COPD Paternal Grandmother    Heart failure Paternal Grandmother    High blood pressure (Hypertension) Paternal Grandmother    Myocardial Infarction (Heart attack) Paternal Grandfather    Coronary Artery Disease (Blocked arteries around heart) Paternal Grandfather    Arthritis Other    Psoriasis Other    Gout Other    Colon cancer Neg Hx    Colon polyps Neg Hx      Social History   Tobacco Use  Smoking Status Never  Smokeless Tobacco Never     Social History  Socioeconomic History   Marital status: Married  Tobacco Use   Smoking status: Never   Smokeless tobacco: Never  Vaping Use   Vaping Use: Never used  Substance and Sexual Activity   Alcohol use: No   Drug use: No   Sexual activity: Yes    Partners: Male    Birth control/protection: Surgical    Comment: tubes tied    Objective:    Vitals:   12/15/21 0859  BP: 120/72  Pulse: 106  Temp: 36.7 C (98.1 F)  SpO2: 98%  Weight: 82.5 kg (181 lb 12.8 oz)  Height: 147.3 cm (4\' 10" )    Body mass index is 38 kg/m.  Alert and well-appearing Unlabored respirations     Assessment and Plan:  Diagnoses and all orders for this visit:  Morbid obesity (CMS-HCC)  Essential hypertension  OSA on CPAP  Psoriatic arthritis ([place])    We have previously discussed the options available here including Roux-en-Y gastric bypass or sleeve gastrectomy, discussed the relevant anatomy and technique using a diagram to demonstrate.  We discussed the postoperative risk profiles and possible long-term complications of both.  She is interested in sleeve gastrectomy and  we discussed specifically there is a 10% risk of worsened reflux after this operation, with other risks including bleeding, infection, pain, scarring, injury to intra-abdominal structures, staple line leak or abscess chronic abdominal pain or nausea, DVT/PE, pneumonia, heart attack, stroke, failure to reach weight loss goals and weight regain, hernia.  She remains an excellent candidate for bariatric surgery.  She understands the importance of lifelong behavioral changes to combat the chronic and relapsing disease which is obesity.  Questions were welcomed and answered to her satisfaction.  We will plan to proceed with surgery when scheduled.    Hillel Card Raquel James, MD

## 2021-12-16 ENCOUNTER — Encounter: Payer: BC Managed Care – PPO | Admitting: Dietician

## 2021-12-16 VITALS — Ht <= 58 in | Wt 181.3 lb

## 2021-12-16 DIAGNOSIS — I10 Essential (primary) hypertension: Secondary | ICD-10-CM

## 2021-12-16 DIAGNOSIS — Z6837 Body mass index (BMI) 37.0-37.9, adult: Secondary | ICD-10-CM | POA: Diagnosis not present

## 2021-12-16 DIAGNOSIS — Z713 Dietary counseling and surveillance: Secondary | ICD-10-CM | POA: Diagnosis not present

## 2021-12-16 NOTE — Progress Notes (Signed)
Pre-Operative Nutrition Class:  Appt start time: 0900   End time:  1100  Patient was seen on 12/16/21 for Pre-Operative Bariatric Surgery Education at [facility] at [facility].   Surgery date: 12/2021 Surgery type: sleeve gastrectomy Start weight at [facility]: 185.9lbs Weight today: 181.3lbs  InBody  BODY COMP RESULTS  12/16/21  BMI (kg/m^2) 37.9  Fat Mass (lbs) 92  Dry Lean Mass (lbs) 23.6  Total Body Water (lbs) 65.7    Lot number Expiration date  Celebrate Vitamins Pack:          Calcium soft chews watermelon 2111 04/2022  Calcium soft chews straw-ban cream 2122 05/2022  Calcium soft chews cafe mocha 2117 04/2022  Calcium soft chews orange 2180 06/2022  Calcium soft chews pb chocolate [id number] 05/2022  Calcium soft chews caramel 2055 02/2022  Calcium soft chews chocolate 2082 04/2022  Calcium soft chews rasp lemonade [id number] 04/2022  Calcium soft chews lemon cream 2159 06/2022  Calcium plus 500 tab cherry tart [id number] 12/2021      Iron soft chews 9m cherry burst 221194R701/2024  Iron soft chews 677mtwisted citrus 2240814G84/2024  Iron 45 tab grape [id number] 08/2022  B 12 quick melt 11185-63141/2023      Multivitamin soft chews very cherry 21011B6   Multivitamin soft chews strawberry 21011A6          Unjury products:                   Unflavored powder  [id number] 12/2021  Chicken soup powder  [id number] 12/2021    The following the learning objectives were met by the patient during this course: Identify Pre-Op Dietary Goals and will begin 2 weeks pre-operatively Identify appropriate sources of fluids and proteins  State protein recommendations and appropriate sources pre and post-operatively Identify Post-Operative Dietary Goals and will follow for 2 weeks post-operatively Identify appropriate multivitamin and calcium sources Describe the need for physical activity post-operatively and will follow MD recommendations State  when to call healthcare provider regarding medication questions or post-operative complications  Handouts given during class include: Pre-Op Bariatric Surgery Diet Handout Protein Shake Handout Post-Op Bariatric Surgery Nutrition Handout BELT Program Information Flyer Support Group [facility]er [facility]  Follow-Up Plan: Patient will follow-up at [facility] about 2 weeks post operatively for diet advancement per MD.

## 2022-01-10 NOTE — Patient Instructions (Addendum)
DUE TO COVID-19 ONLY TWO VISITORS  (aged 48 and older)  ARE ALLOWED TO COME WITH YOU AND STAY IN THE WAITING ROOM ONLY DURING PRE OP AND PROCEDURE.   **NO VISITORS ARE ALLOWED IN THE SHORT STAY AREA OR RECOVERY ROOM!!**  IF YOU WILL BE ADMITTED INTO THE HOSPITAL YOU ARE ALLOWED ONLY FOUR SUPPORT PEOPLE DURING VISITATION HOURS ONLY (7 AM -8PM)   The support person(s) must pass our screening, gel in and out, and wear a mask at all times, including in the patient's room. Patients must also wear a mask when staff or their support person are in the room. Visitors GUEST BADGE MUST BE WORN VISIBLY  One adult visitor may remain with you overnight and MUST be in the room by 8 P.M.     Your procedure is scheduled on: 01/23/22   Report to [facility] Main Entrance    Report to admitting at 10:30 AM   Call this number if you have problems the morning of surgery [phone]   MORNING OF SURGERY DRINK:   DRINK 1 G2 drink BEFORE YOU LEAVE HOME, DRINK ALL OF THE  G2 DRINK AT ONE TIME.   NO SOLID FOOD AFTER 600 PM THE NIGHT BEFORE YOUR SURGERY. YOU MAY DRINK CLEAR FLUIDS. THE G2 DRINK YOU DRINK BEFORE YOU LEAVE HOME WILL BE THE LAST FLUIDS YOU DRINK BEFORE SURGERY.  PAIN IS EXPECTED AFTER SURGERY AND WILL NOT BE COMPLETELY ELIMINATED. AMBULATION AND TYLENOL WILL HELP REDUCE INCISIONAL AND GAS PAIN. MOVEMENT IS KEY!  YOU ARE EXPECTED TO BE OUT OF BED WITHIN 4 HOURS OF ADMISSION TO YOUR PATIENT ROOM.  SITTING IN THE RECLINER THROUGHOUT THE DAY IS IMPORTANT FOR DRINKING FLUIDS AND MOVING GAS THROUGHOUT THE GI TRACT.  COMPRESSION STOCKINGS SHOULD BE WORN [facility] STAY UNLESS YOU ARE WALKING.   INCENTIVE SPIROMETER SHOULD BE USED EVERY HOUR WHILE AWAKE TO DECREASE POST-OPERATIVE COMPLICATIONS SUCH AS PNEUMONIA.  WHEN DISCHARGED HOME, IT IS IMPORTANT TO CONTINUE TO WALK EVERY HOUR AND USE THE INCENTIVE SPIROMETER EVERY HOUR.    You may have the following liquids until 9:45 AM/DAY  OF SURGERY  Water Black Coffee (sugar ok, NO MILK/CREAM OR CREAMERS)  Tea (sugar ok, NO MILK/CREAM OR CREAMERS) regular and decaf                             Plain Jell-O (NO RED)                                           Fruit ices (not with fruit pulp, NO RED)                                     Popsicles (NO RED)                                                                  Juice: apple, WHITE grape, WHITE cranberry Sports drinks like Gatorade (NO RED) Clear broth(vegetable,chicken,beef)    The day of surgery:  Drink ONE (1) Pre-Surgery  G2 at 9:45 AM the morning of surgery. Drink in one sitting. Do not sip.  This drink was given to you during your hospital  pre-op appointment visit. Nothing else to drink after completing the  Pre-Surgery G2.          If you have questions, please contact your surgeon's office.   FOLLOW BOWEL PREP AND ANY ADDITIONAL PRE OP INSTRUCTIONS YOU RECEIVED FROM YOUR SURGEON'S OFFICE!!!     Oral Hygiene is also important to reduce your risk of infection.                                    Remember - BRUSH YOUR TEETH THE MORNING OF SURGERY WITH YOUR REGULAR TOOTHPASTE   Take these medicines the morning of surgery with A SIP OF WATER: Amlodipine, Buspirone, Omeprazole, Topiramate.   Bring CPAP mask and tubing day of surgery.                              You may not have any metal on your body including hair pins, jewelry, and body piercing             Do not wear make-up, lotions, powders, perfumes, or deodorant  Do not wear nail polish including gel and S&S, artificial/acrylic nails, or any other type of covering on natural nails including finger and toenails. If you have artificial nails, gel coating, etc. that needs to be removed by a nail salon please have this removed prior to surgery or surgery may need to be canceled/ delayed if the surgeon/ anesthesia feels like they are unable to be safely monitored.   Do not shave  48 hours prior to  surgery.    Do not bring valuables to the hospital. [facility] IS NOT             RESPONSIBLE   FOR VALUABLES.   Bring small overnight bag day of surgery.   DO NOT BRING YOUR HOME MEDICATIONS TO THE HOSPITAL. PHARMACY WILL DISPENSE MEDICATIONS LISTED ON YOUR MEDICATION LIST TO YOU DURING YOUR ADMISSION IN THE HOSPITAL!   Special Instructions: Bring a copy of your healthcare power of attorney and living will documents         the day of surgery if you haven't scanned them before.              Please read over the following fact sheets you were given: IF YOU HAVE QUESTIONS ABOUT YOUR PRE-OP INSTRUCTIONS PLEASE CALL [phone]- [facility]     [place] - Preparing for Surgery Before surgery, you can play an important role.  Because skin is not sterile, your skin needs to be as free of germs as possible.  You can reduce the number of germs on your skin by washing with CHG (chlorahexidine gluconate) soap before surgery.  CHG is an antiseptic cleaner which kills germs and bonds with the skin to continue killing germs even after washing. Please DO NOT use if you have an allergy to CHG or antibacterial soaps.  If your skin becomes reddened/irritated stop using the CHG and inform your nurse when you arrive at Short Stay. Do not shave (including legs and underarms) for at least 48 hours prior to the first CHG shower.  You may shave your face/neck.  Please follow these instructions carefully:  1.  Shower with CHG Soap the night  before surgery and the  morning of surgery.  2.  If you choose to wash your hair, wash your hair first as usual with your normal  shampoo.  3.  After you shampoo, rinse your hair and body thoroughly to remove the shampoo.                             4.  Use CHG as you would any other liquid soap.  You can apply chg directly to the skin and wash.  Gently with a scrungie or clean washcloth.  5.  Apply the CHG Soap to your body ONLY FROM THE NECK DOWN.   Do   not use on face/ open                            Wound or open sores. Avoid contact with eyes, ears mouth and   genitals (private parts).                       Wash face,  Genitals (private parts) with your normal soap.             6.  Wash thoroughly, paying special attention to the area where your    surgery  will be performed.  7.  Thoroughly rinse your body with warm water from the neck down.  8.  DO NOT shower/wash with your normal soap after using and rinsing off the CHG Soap.                9.  Pat yourself dry with a clean towel.            10.  Wear clean pajamas.            11.  Place clean sheets on your bed the night of your first shower and do not  sleep with pets. Day of Surgery : Do not apply any lotions/deodorants the morning of surgery.  Please wear clean clothes to the hospital/surgery center.  FAILURE TO FOLLOW THESE INSTRUCTIONS MAY RESULT IN THE CANCELLATION OF YOUR SURGERY  PATIENT SIGNATURE_________________________________  NURSE SIGNATURE__________________________________  ________________________________________________________________________   Rogelia Mire  An incentive spirometer is a tool that can help keep your lungs clear and active. This tool measures how well you are filling your lungs with each breath. Taking long deep breaths may help reverse or decrease the chance of developing breathing (pulmonary) problems (especially infection) following: A long period of time when you are unable to move or be active. BEFORE THE PROCEDURE  If the spirometer includes an indicator to show your best effort, your nurse or respiratory therapist will set it to a desired goal. If possible, sit up straight or lean slightly forward. Try not to slouch. Hold the incentive spirometer in an upright position. INSTRUCTIONS FOR USE  Sit on the edge of your bed if possible, or sit up as far as you can in bed or on a chair. Hold the incentive spirometer in an upright position. Breathe out  normally. Place the mouthpiece in your mouth and seal your lips tightly around it. Breathe in slowly and as deeply as possible, raising the piston or the ball toward the top of the column. Hold your breath for 3-5 seconds or for as long as possible. Allow the piston or ball to fall to the bottom of the column. Remove the mouthpiece from your mouth  and breathe out normally. Rest for a few seconds and repeat Steps 1 through 7 at least 10 times every 1-2 hours when you are awake. Take your time and take a few normal breaths between deep breaths. The spirometer may include an indicator to show your best effort. Use the indicator as a goal to work toward during each repetition. After each set of 10 deep breaths, practice coughing to be sure your lungs are clear. If you have an incision (the cut made at the time of surgery), support your incision when coughing by placing a pillow or rolled up towels firmly against it. Once you are able to get out of bed, walk around indoors and cough well. You may stop using the incentive spirometer when instructed by your caregiver.  RISKS AND COMPLICATIONS Take your time so you do not get dizzy or light-headed. If you are in pain, you may need to take or ask for pain medication before doing incentive spirometry. It is harder to take a deep breath if you are having pain. AFTER USE Rest and breathe slowly and easily. It can be helpful to keep track of a log of your progress. Your caregiver can provide you with a simple table to help with this. If you are using the spirometer at home, follow these instructions: SEEK MEDICAL CARE IF:  You are having difficultly using the spirometer. You have trouble using the spirometer as often as instructed. Your pain medication is not giving enough relief while using the spirometer. You develop fever of 100.5 F (38.1 C) or higher. SEEK IMMEDIATE MEDICAL CARE IF:  You cough up bloody sputum that had not been present before. You  develop fever of 102 F (38.9 C) or greater. You develop worsening pain at or near the incision site. MAKE SURE YOU:  Understand these instructions. Will watch your condition. Will get help right away if you are not doing well or get worse. Document Released: 11/27/2006 Document Revised: 10/09/2011 Document Reviewed: 01/28/2007 Exit[employer] Patient Information 2014 Exit[employer], [place].   ________________________________________________________________________  WHAT IS A BLOOD TRANSFUSION? Blood Transfusion Information  A transfusion is the replacement of blood or some of its parts. Blood is made up of multiple cells which provide different functions. Red blood cells carry oxygen and are used for blood loss replacement. White blood cells fight against infection. Platelets control bleeding. Plasma helps clot blood. Other blood products are available for specialized needs, such as hemophilia or other clotting disorders. BEFORE THE TRANSFUSION  Who gives blood for transfusions?  Healthy volunteers who are fully evaluated to make sure their blood is safe. This is blood bank blood. Transfusion therapy is the safest it has ever been in the practice of medicine. Before blood is taken from a donor, a complete history is taken to make sure that person has no history of diseases nor engages in risky social behavior (examples are intravenous drug use or sexual activity with multiple partners). The donor's travel history is screened to minimize risk of transmitting infections, such as malaria. The donated blood is tested for signs of infectious diseases, such as HIV and hepatitis. The blood is then tested to be sure it is compatible with you in order to minimize the chance of a transfusion reaction. If you or a relative donates blood, this is often done in anticipation of surgery and is not appropriate for emergency situations. It takes many days to process the donated blood. RISKS AND COMPLICATIONS Although  transfusion therapy is very safe and saves  many lives, the main dangers of transfusion include:  Getting an infectious disease. Developing a transfusion reaction. This is an allergic reaction to something in the blood you were given. Every precaution is taken to prevent this. The decision to have a blood transfusion has been considered carefully by your caregiver before blood is given. Blood is not given unless the benefits outweigh the risks. AFTER THE TRANSFUSION Right after receiving a blood transfusion, you will usually feel much better and more energetic. This is especially true if your red blood cells have gotten low (anemic). The transfusion raises the level of the red blood cells which carry oxygen, and this usually causes an energy increase. The nurse administering the transfusion will monitor you carefully for complications. HOME CARE INSTRUCTIONS  No special instructions are needed after a transfusion. You may find your energy is better. Speak with your caregiver about any limitations on activity for underlying diseases you may have. SEEK MEDICAL CARE IF:  Your condition is not improving after your transfusion. You develop redness or irritation at the intravenous (IV) site. SEEK IMMEDIATE MEDICAL CARE IF:  Any of the following symptoms occur over the next 12 hours: Shaking chills. You have a temperature by mouth above 102 F (38.9 C), not controlled by medicine. Chest, back, or muscle pain. People around you feel you are not acting correctly or are confused. Shortness of breath or difficulty breathing. Dizziness and fainting. You get a rash or develop hives. You have a decrease in urine output. Your urine turns a dark color or changes to pink, red, or brown. Any of the following symptoms occur over the next 10 days: You have a temperature by mouth above 102 F (38.9 C), not controlled by medicine. Shortness of breath. Weakness after normal activity. The white part of the eye  turns yellow (jaundice). You have a decrease in the amount of urine or are urinating less often. Your urine turns a dark color or changes to pink, red, or brown. Document Released: 07/14/2000 Document Revised: 10/09/2011 Document Reviewed: 03/02/2008 [facility] Patient Information 2014 [place], [place].  _______________________________________________________________________

## 2022-01-10 NOTE — Progress Notes (Addendum)
COVID Vaccine Completed: no  Date of COVID positive in last 90 days: no  PCP - Cathie Hoops, PA Cardiologist - n/a  Chest x-ray - 12/01/21 Epic EKG - 08/31/21 Epic Stress Test - 06/02/20 [facility] ECHO - 06/02/20 [facility] Cardiac Cath - n/a Pacemaker/ICD device last checked: n/a Spinal Cord Stimulator: n/a  Bowel Prep - no solids after 6pm night before  Sleep Study - yes, positive CPAP - yes  Fasting Blood Sugar - n/a Checks Blood Sugar _____ times a day  Blood Thinner Instructions: n/a Aspirin Instructions: Last Dose:  Activity level: Can go up a flight of stairs and perform activities of daily living without stopping and without symptoms of chest pain or shortness of breath.    Anesthesia review:   Patient denies shortness of breath, fever, cough and chest pain at PAT appointment  Patient verbalized understanding of instructions that were given to them at the PAT appointment. Patient was also instructed that they will need to review over the PAT instructions again at home before surgery.

## 2022-01-11 ENCOUNTER — Other Ambulatory Visit (HOSPITAL_COMMUNITY): Payer: Self-pay

## 2022-01-12 ENCOUNTER — Encounter (HOSPITAL_COMMUNITY)
Admission: RE | Admit: 2022-01-12 | Discharge: 2022-01-12 | Disposition: A | Discharge status: Home / Self Care | Payer: BC Managed Care – PPO | Source: Ambulatory Visit | Attending: Surgery | Admitting: Surgery

## 2022-01-12 ENCOUNTER — Encounter (HOSPITAL_COMMUNITY): Payer: Self-pay

## 2022-01-12 DIAGNOSIS — Z01812 Encounter for preprocedural laboratory examination: Secondary | ICD-10-CM | POA: Diagnosis present

## 2022-01-12 LAB — CBC WITH DIFFERENTIAL/PLATELET
Abs Immature Granulocytes: 0.02 10*3/uL (ref 0.00–0.07)
Basophils Absolute: 0.1 10*3/uL (ref 0.0–0.1)
Basophils Relative: 1 %
Eosinophils Absolute: 0.5 10*3/uL (ref 0.0–0.5)
Eosinophils Relative: 6 %
HCT: 43.5 % (ref 36.0–46.0)
Hemoglobin: 14.8 g/dL (ref 12.0–15.0)
Immature Granulocytes: 0 %
Lymphocytes Relative: 28 %
Lymphs Abs: 2.2 10*3/uL (ref 0.7–4.0)
MCH: 32.4 pg (ref 26.0–34.0)
MCHC: 34 g/dL (ref 30.0–36.0)
MCV: 95.2 fL (ref 80.0–100.0)
Monocytes Absolute: 0.5 10*3/uL (ref 0.1–1.0)
Monocytes Relative: 6 %
Neutro Abs: 4.8 10*3/uL (ref 1.7–7.7)
Neutrophils Relative %: 59 %
Platelets: 285 10*3/uL (ref 150–400)
RBC: 4.57 MIL/uL (ref 3.87–5.11)
RDW: 13.1 % (ref 11.5–15.5)
WBC: 8.1 10*3/uL (ref 4.0–10.5)
nRBC: 0 % (ref 0.0–0.2)

## 2022-01-12 LAB — COMPREHENSIVE METABOLIC PANEL
ALT: 26 U/L (ref 0–44)
AST: 20 U/L (ref 15–41)
Albumin: 3.9 g/dL (ref 3.5–5.0)
Alkaline Phosphatase: 55 U/L (ref 38–126)
Anion gap: 7 (ref 5–15)
BUN: 15 mg/dL (ref 6–20)
CO2: 24 mmol/L (ref 22–32)
Calcium: 9.2 mg/dL (ref 8.9–10.3)
Chloride: 108 mmol/L (ref 98–111)
Creatinine, Ser: 1.06 mg/dL — ABNORMAL HIGH (ref 0.44–1.00)
GFR, Estimated: >60 mL/min (ref >60)
Glucose, Bld: 94 mg/dL (ref 70–99)
Potassium: 3.7 mmol/L (ref 3.5–5.1)
Sodium: 139 mmol/L (ref 135–145)
Total Bilirubin: 0.7 mg/dL (ref 0.3–1.2)
Total Protein: 7.6 g/dL (ref 6.5–8.1)

## 2022-01-12 LAB — TYPE AND SCREEN
ABO/RH(D): O POS
Antibody Screen: NEGATIVE

## 2022-01-23 ENCOUNTER — Other Ambulatory Visit: Payer: Self-pay

## 2022-01-23 ENCOUNTER — Inpatient Hospital Stay (HOSPITAL_COMMUNITY): Payer: BC Managed Care – PPO | Admitting: Certified Registered"

## 2022-01-23 ENCOUNTER — Encounter (HOSPITAL_COMMUNITY): Payer: Self-pay | Admitting: Surgery

## 2022-01-23 ENCOUNTER — Inpatient Hospital Stay (HOSPITAL_COMMUNITY)
Admission: RE | Admit: 2022-01-23 | Discharge: 2022-01-25 | DRG: 621 | Disposition: A | Discharge status: Home / Self Care | Payer: BC Managed Care – PPO | Attending: Surgery | Admitting: Surgery

## 2022-01-23 ENCOUNTER — Encounter (HOSPITAL_COMMUNITY)
Admission: RE | Disposition: A | Discharge status: Home / Self Care | Payer: Self-pay | Source: Home / Self Care | Attending: Surgery

## 2022-01-23 DIAGNOSIS — K449 Diaphragmatic hernia without obstruction or gangrene: Secondary | ICD-10-CM | POA: Diagnosis present

## 2022-01-23 DIAGNOSIS — G43709 Chronic migraine without aura, not intractable, without status migrainosus: Secondary | ICD-10-CM | POA: Diagnosis present

## 2022-01-23 DIAGNOSIS — Z7960 Long term (current) use of unspecified immunomodulators and immunosuppressants: Secondary | ICD-10-CM | POA: Diagnosis not present

## 2022-01-23 DIAGNOSIS — K219 Gastro-esophageal reflux disease without esophagitis: Secondary | ICD-10-CM | POA: Diagnosis present

## 2022-01-23 DIAGNOSIS — Z888 Allergy status to other drugs, medicaments and biological substances status: Secondary | ICD-10-CM

## 2022-01-23 DIAGNOSIS — Z833 Family history of diabetes mellitus: Secondary | ICD-10-CM | POA: Diagnosis not present

## 2022-01-23 DIAGNOSIS — Z825 Family history of asthma and other chronic lower respiratory diseases: Secondary | ICD-10-CM | POA: Diagnosis not present

## 2022-01-23 DIAGNOSIS — Z8249 Family history of ischemic heart disease and other diseases of the circulatory system: Secondary | ICD-10-CM | POA: Diagnosis not present

## 2022-01-23 DIAGNOSIS — L4050 Arthropathic psoriasis, unspecified: Secondary | ICD-10-CM | POA: Diagnosis present

## 2022-01-23 DIAGNOSIS — Z886 Allergy status to analgesic agent status: Secondary | ICD-10-CM

## 2022-01-23 DIAGNOSIS — K222 Esophageal obstruction: Secondary | ICD-10-CM | POA: Diagnosis present

## 2022-01-23 DIAGNOSIS — Z818 Family history of other mental and behavioral disorders: Secondary | ICD-10-CM

## 2022-01-23 DIAGNOSIS — I10 Essential (primary) hypertension: Secondary | ICD-10-CM | POA: Diagnosis present

## 2022-01-23 DIAGNOSIS — Z6837 Body mass index (BMI) 37.0-37.9, adult: Secondary | ICD-10-CM

## 2022-01-23 DIAGNOSIS — G4733 Obstructive sleep apnea (adult) (pediatric): Secondary | ICD-10-CM | POA: Diagnosis present

## 2022-01-23 HISTORY — PX: UPPER GI ENDOSCOPY: SHX6162

## 2022-01-23 HISTORY — PX: HIATAL HERNIA REPAIR: SHX195

## 2022-01-23 HISTORY — PX: LAPAROSCOPIC GASTRIC SLEEVE RESECTION: SHX5895

## 2022-01-23 LAB — PREGNANCY, URINE: Preg Test, Ur: NEGATIVE

## 2022-01-23 LAB — ABO/RH: ABO/RH(D): O POS

## 2022-01-23 LAB — GLUCOSE, CAPILLARY: Glucose-Capillary: 124 mg/dL — ABNORMAL HIGH (ref 70–99)

## 2022-01-23 SURGERY — GASTRECTOMY, SLEEVE, LAPAROSCOPIC
Anesthesia: General

## 2022-01-23 MED ORDER — HYDRALAZINE HCL 20 MG/ML IJ SOLN: 10.0000 mg | INTRAMUSCULAR | Status: DC | PRN

## 2022-01-23 MED ORDER — ACETAMINOPHEN 160 MG/5ML PO SOLN: 1000.0000 mg | Freq: Three times a day (TID) | ORAL | Status: DC

## 2022-01-23 MED ORDER — PHENYLEPHRINE HCL-NACL 20-0.9 MG/250ML-% IV SOLN
INTRAVENOUS | Status: DC | PRN
  Administered 2022-01-23: 50 ug/min via INTRAVENOUS

## 2022-01-23 MED ORDER — BUPIVACAINE LIPOSOME 1.3 % IJ SUSP
INTRAMUSCULAR | Status: DC | PRN
  Administered 2022-01-23: 20 mL

## 2022-01-23 MED ORDER — STERILE WATER FOR IRRIGATION IR SOLN
Status: DC | PRN
  Administered 2022-01-23: 1000 mL

## 2022-01-23 MED ORDER — ACETAMINOPHEN 500 MG PO TABS
1000.0000 mg | ORAL_TABLET | Freq: Three times a day (TID) | ORAL | Status: DC
  Administered 2022-01-23 – 2022-01-25 (×6): 1000 mg via ORAL
  Filled 2022-01-23 (×6): qty 2

## 2022-01-23 MED ORDER — SUGAMMADEX SODIUM 200 MG/2ML IV SOLN
INTRAVENOUS | Status: DC | PRN
  Administered 2022-01-23: 200 mg via INTRAVENOUS

## 2022-01-23 MED ORDER — ENOXAPARIN SODIUM 30 MG/0.3ML IJ SOSY: 30.0000 mg | PREFILLED_SYRINGE | Freq: Two times a day (BID) | INTRAMUSCULAR | Status: DC

## 2022-01-23 MED ORDER — ROCURONIUM BROMIDE 10 MG/ML (PF) SYRINGE
PREFILLED_SYRINGE | INTRAVENOUS | Status: DC | PRN
  Administered 2022-01-23: 90 mg via INTRAVENOUS

## 2022-01-23 MED ORDER — BUPIVACAINE-EPINEPHRINE 0.25% -1:200000 IJ SOLN
INTRAMUSCULAR | Status: AC
  Filled 2022-01-23: qty 1

## 2022-01-23 MED ORDER — OXYCODONE HCL 5 MG PO TABS: 5.0000 mg | ORAL_TABLET | Freq: Once | ORAL | Status: DC | PRN

## 2022-01-23 MED ORDER — AMLODIPINE BESYLATE 5 MG PO TABS
5.0000 mg | ORAL_TABLET | Freq: Every day | ORAL | Status: DC
Start: 2022-01-24 — End: 2022-01-24

## 2022-01-23 MED ORDER — ONDANSETRON HCL 4 MG/2ML IJ SOLN
4.0000 mg | INTRAMUSCULAR | Status: DC | PRN
  Administered 2022-01-23: 4 mg via INTRAVENOUS
  Filled 2022-01-23: qty 2

## 2022-01-23 MED ORDER — MIDAZOLAM HCL 5 MG/5ML IJ SOLN
INTRAMUSCULAR | Status: DC | PRN
  Administered 2022-01-23: 2 mg via INTRAVENOUS

## 2022-01-23 MED ORDER — TOPIRAMATE 25 MG PO TABS
25.0000 mg | ORAL_TABLET | Freq: Two times a day (BID) | ORAL | Status: DC
Start: 2022-01-23 — End: 2022-01-25
  Administered 2022-01-23 – 2022-01-25 (×4): 25 mg via ORAL
  Filled 2022-01-23 (×4): qty 1

## 2022-01-23 MED ORDER — BUPIVACAINE LIPOSOME 1.3 % IJ SUSP
INTRAMUSCULAR | Status: AC
  Filled 2022-01-23: qty 20

## 2022-01-23 MED ORDER — CHLORHEXIDINE GLUCONATE 4 % EX LIQD: 60.0000 mL | Freq: Once | CUTANEOUS | Status: DC

## 2022-01-23 MED ORDER — LACTATED RINGERS IR SOLN
Status: DC | PRN
  Administered 2022-01-23: 1000 mL

## 2022-01-23 MED ORDER — BUSPIRONE HCL 10 MG PO TABS
10.0000 mg | ORAL_TABLET | Freq: Two times a day (BID) | ORAL | Status: DC
Start: 2022-01-23 — End: 2022-01-25
  Administered 2022-01-23 – 2022-01-25 (×4): 10 mg via ORAL
  Filled 2022-01-23 (×5): qty 1

## 2022-01-23 MED ORDER — BUPIVACAINE LIPOSOME 1.3 % IJ SUSP: 20.0000 mL | Freq: Once | INTRAMUSCULAR | Status: DC

## 2022-01-23 MED ORDER — ORAL CARE MOUTH RINSE: 15.0000 mL | Freq: Once | OROMUCOSAL | Status: AC

## 2022-01-23 MED ORDER — METOPROLOL TARTRATE 5 MG/5ML IV SOLN: 5.0000 mg | Freq: Four times a day (QID) | INTRAVENOUS | Status: DC | PRN

## 2022-01-23 MED ORDER — ONDANSETRON HCL 4 MG/2ML IJ SOLN: 4.0000 mg | Freq: Once | INTRAMUSCULAR | Status: DC | PRN

## 2022-01-23 MED ORDER — SCOPOLAMINE 1 MG/3DAYS TD PT72
1.0000 | MEDICATED_PATCH | TRANSDERMAL | Status: DC
  Administered 2022-01-23: 1.5 mg via TRANSDERMAL
  Filled 2022-01-23: qty 1

## 2022-01-23 MED ORDER — FENTANYL CITRATE (PF) 250 MCG/5ML IJ SOLN
INTRAMUSCULAR | Status: AC
  Filled 2022-01-23: qty 5

## 2022-01-23 MED ORDER — SODIUM CHLORIDE 0.9 % IV SOLN
2.0000 g | INTRAVENOUS | Status: AC
  Administered 2022-01-23: 2 g via INTRAVENOUS
  Filled 2022-01-23: qty 2

## 2022-01-23 MED ORDER — MIDAZOLAM HCL 2 MG/2ML IJ SOLN
INTRAMUSCULAR | Status: AC
  Filled 2022-01-23: qty 2

## 2022-01-23 MED ORDER — DOCUSATE SODIUM 100 MG PO CAPS
100.0000 mg | ORAL_CAPSULE | Freq: Two times a day (BID) | ORAL | Status: DC
  Administered 2022-01-23 – 2022-01-25 (×5): 100 mg via ORAL
  Filled 2022-01-23 (×5): qty 1

## 2022-01-23 MED ORDER — ONDANSETRON HCL 4 MG/2ML IJ SOLN
INTRAMUSCULAR | Status: DC | PRN
  Administered 2022-01-23: 4 mg via INTRAVENOUS

## 2022-01-23 MED ORDER — SIMETHICONE 80 MG PO CHEW: 80.0000 mg | CHEWABLE_TABLET | Freq: Four times a day (QID) | ORAL | Status: DC | PRN

## 2022-01-23 MED ORDER — PANTOPRAZOLE SODIUM 40 MG IV SOLR
40.0000 mg | Freq: Every day | INTRAVENOUS | Status: DC
  Administered 2022-01-23: 40 mg via INTRAVENOUS
  Filled 2022-01-23 (×2): qty 10

## 2022-01-23 MED ORDER — BUPIVACAINE-EPINEPHRINE 0.25% -1:200000 IJ SOLN
INTRAMUSCULAR | Status: DC | PRN
  Administered 2022-01-23: 30 mL

## 2022-01-23 MED ORDER — APREPITANT 40 MG PO CAPS
40.0000 mg | ORAL_CAPSULE | ORAL | Status: AC
  Administered 2022-01-23: 40 mg via ORAL
  Filled 2022-01-23: qty 1

## 2022-01-23 MED ORDER — LACTATED RINGERS IV SOLN: INTRAVENOUS | Status: DC

## 2022-01-23 MED ORDER — SODIUM CHLORIDE 0.9 % IV SOLN: INTRAVENOUS | Status: DC

## 2022-01-23 MED ORDER — PROPOFOL 10 MG/ML IV BOLUS
INTRAVENOUS | Status: DC | PRN
  Administered 2022-01-23: 150 mg via INTRAVENOUS

## 2022-01-23 MED ORDER — FENTANYL CITRATE (PF) 100 MCG/2ML IJ SOLN
INTRAMUSCULAR | Status: DC | PRN
  Administered 2022-01-23: 50 ug via INTRAVENOUS

## 2022-01-23 MED ORDER — HEPARIN SODIUM (PORCINE) 5000 UNIT/ML IJ SOLN
5000.0000 [IU] | INTRAMUSCULAR | Status: AC
  Administered 2022-01-23: 5000 [IU] via SUBCUTANEOUS
  Filled 2022-01-23: qty 1

## 2022-01-23 MED ORDER — FENTANYL CITRATE PF 50 MCG/ML IJ SOSY
PREFILLED_SYRINGE | INTRAMUSCULAR | Status: AC
  Filled 2022-01-23: qty 2

## 2022-01-23 MED ORDER — FENTANYL CITRATE PF 50 MCG/ML IJ SOSY
25.0000 ug | PREFILLED_SYRINGE | INTRAMUSCULAR | Status: DC | PRN
  Administered 2022-01-23: 50 ug via INTRAVENOUS

## 2022-01-23 MED ORDER — METHOCARBAMOL 500 MG IVPB - SIMPLE MED: 500.0000 mg | Freq: Four times a day (QID) | INTRAVENOUS | Status: DC | PRN

## 2022-01-23 MED ORDER — ACETAMINOPHEN 500 MG PO TABS
1000.0000 mg | ORAL_TABLET | ORAL | Status: AC
  Administered 2022-01-23: 1000 mg via ORAL
  Filled 2022-01-23: qty 2

## 2022-01-23 MED ORDER — HYDROMORPHONE HCL 1 MG/ML IJ SOLN: 0.5000 mg | INTRAMUSCULAR | Status: DC | PRN

## 2022-01-23 MED ORDER — TRAMADOL HCL 50 MG PO TABS
50.0000 mg | ORAL_TABLET | Freq: Four times a day (QID) | ORAL | Status: DC | PRN
  Administered 2022-01-23 – 2022-01-24 (×3): 50 mg via ORAL
  Filled 2022-01-23 (×3): qty 1

## 2022-01-23 MED ORDER — OXYCODONE HCL 5 MG/5ML PO SOLN
5.0000 mg | Freq: Four times a day (QID) | ORAL | Status: DC | PRN
  Administered 2022-01-23 – 2022-01-25 (×5): 5 mg via ORAL
  Filled 2022-01-23 (×5): qty 5

## 2022-01-23 MED ORDER — OXYCODONE HCL 5 MG/5ML PO SOLN: 5.0000 mg | Freq: Once | ORAL | Status: DC | PRN

## 2022-01-23 MED ORDER — ENSURE MAX PROTEIN PO LIQD
2.0000 [oz_av] | ORAL | Status: DC
  Administered 2022-01-24 (×7): 2 [oz_av] via ORAL

## 2022-01-23 MED ORDER — METOCLOPRAMIDE HCL 5 MG/ML IJ SOLN
10.0000 mg | Freq: Four times a day (QID) | INTRAMUSCULAR | Status: DC
  Administered 2022-01-23 – 2022-01-24 (×4): 10 mg via INTRAVENOUS
  Filled 2022-01-23 (×5): qty 2

## 2022-01-23 MED ORDER — AMISULPRIDE (ANTIEMETIC) 5 MG/2ML IV SOLN: 10.0000 mg | Freq: Once | INTRAVENOUS | Status: DC | PRN

## 2022-01-23 MED ORDER — LIDOCAINE 2% (20 MG/ML) 5 ML SYRINGE
INTRAMUSCULAR | Status: DC | PRN
  Administered 2022-01-23: 60 mg via INTRAVENOUS

## 2022-01-23 MED ORDER — GABAPENTIN 100 MG PO CAPS
200.0000 mg | ORAL_CAPSULE | Freq: Two times a day (BID) | ORAL | Status: DC
  Administered 2022-01-23 – 2022-01-25 (×5): 200 mg via ORAL
  Filled 2022-01-23 (×5): qty 2

## 2022-01-23 MED ORDER — 0.9 % SODIUM CHLORIDE (POUR BTL) OPTIME
TOPICAL | Status: DC | PRN
  Administered 2022-01-23: 1000 mL

## 2022-01-23 MED ORDER — DEXAMETHASONE SODIUM PHOSPHATE 10 MG/ML IJ SOLN
INTRAMUSCULAR | Status: DC | PRN
  Administered 2022-01-23: 10 mg via INTRAVENOUS

## 2022-01-23 MED ORDER — CHLORHEXIDINE GLUCONATE 0.12 % MT SOLN
15.0000 mL | Freq: Once | OROMUCOSAL | Status: AC
  Administered 2022-01-23: 15 mL via OROMUCOSAL

## 2022-01-23 MED ORDER — GABAPENTIN 300 MG PO CAPS
300.0000 mg | ORAL_CAPSULE | ORAL | Status: AC
  Administered 2022-01-23: 300 mg via ORAL
  Filled 2022-01-23: qty 1

## 2022-01-23 SURGICAL SUPPLY — 73 items
APPLIER CLIP ROT 10 11.4 M/L (STAPLE)
APPLIER CLIP ROT 13.4 12 LRG (CLIP) ×2
BAG COUNTER SPONGE SURGICOUNT (BAG) IMPLANT
BAG LAPAROSCOPIC 12 15 PORT 16 (BASKET) IMPLANT
BAG RETRIEVAL 12/15 (BASKET) ×2
BENZOIN TINCTURE PRP APPL 2/3 (GAUZE/BANDAGES/DRESSINGS) ×2 IMPLANT
BLADE SURG SZ11 CARB STEEL (BLADE) ×2 IMPLANT
BNDG ADH 1X3 SHEER STRL LF (GAUZE/BANDAGES/DRESSINGS) ×12 IMPLANT
CABLE HIGH FREQUENCY MONO STRZ (ELECTRODE) IMPLANT
CHLORAPREP W/TINT 26 (MISCELLANEOUS) ×4 IMPLANT
CLIP APPLIE ROT 10 11.4 M/L (STAPLE) IMPLANT
CLIP APPLIE ROT 13.4 12 LRG (CLIP) IMPLANT
COVER SURGICAL LIGHT HANDLE (MISCELLANEOUS) ×2 IMPLANT
DEVICE SUT QUICK LOAD TK 5 (SUTURE) ×1 IMPLANT
DEVICE SUT TI-KNOT TK 5X26 (SUTURE) ×1 IMPLANT
DEVICE SUTURE ENDOST 10MM (ENDOMECHANICALS) ×1 IMPLANT
DRAPE UTILITY XL STRL (DRAPES) ×4 IMPLANT
ELECT REM PT RETURN 15FT ADLT (MISCELLANEOUS) ×2 IMPLANT
GAUZE SPONGE 4X4 12PLY STRL (GAUZE/BANDAGES/DRESSINGS) IMPLANT
GLOVE BIO SURGEON STRL SZ 6 (GLOVE) ×2 IMPLANT
GLOVE INDICATOR 6.5 STRL GRN (GLOVE) ×2 IMPLANT
GLOVE SS BIOGEL STRL SZ 6 (GLOVE) ×1 IMPLANT
GLOVE SUPERSENSE BIOGEL SZ 6 (GLOVE) ×1
GOWN STRL REUS W/ TWL LRG LVL3 (GOWN DISPOSABLE) ×1 IMPLANT
GOWN STRL REUS W/ TWL XL LVL3 (GOWN DISPOSABLE) IMPLANT
GOWN STRL REUS W/TWL LRG LVL3 (GOWN DISPOSABLE) ×2
GOWN STRL REUS W/TWL XL LVL3 (GOWN DISPOSABLE)
GRASPER SUT TROCAR 14GX15 (MISCELLANEOUS) ×2 IMPLANT
IRRIG SUCT STRYKERFLOW 2 WTIP (MISCELLANEOUS) ×2
IRRIGATION SUCT STRKRFLW 2 WTP (MISCELLANEOUS) ×1 IMPLANT
KIT BASIN OR (CUSTOM PROCEDURE TRAY) ×2 IMPLANT
KIT TURNOVER KIT A (KITS) ×1 IMPLANT
MARKER SKIN DUAL TIP RULER LAB (MISCELLANEOUS) ×2 IMPLANT
MAT PREVALON FULL STRYKER (MISCELLANEOUS) ×2 IMPLANT
NDL SPNL 22GX3.5 QUINCKE BK (NEEDLE) ×1 IMPLANT
NEEDLE SPNL 22GX3.5 QUINCKE BK (NEEDLE) ×2 IMPLANT
PACK UNIVERSAL I (CUSTOM PROCEDURE TRAY) ×2 IMPLANT
RELOAD ENDO STITCH (ENDOMECHANICALS) IMPLANT
RELOAD STAPLE 60 3.6 BLU REG (STAPLE) ×1 IMPLANT
RELOAD STAPLE 60 3.8 GOLD REG (STAPLE) ×1 IMPLANT
RELOAD STAPLE 60 4.1 GRN THCK (STAPLE) IMPLANT
RELOAD STAPLER BLUE 60MM (STAPLE) ×5 IMPLANT
RELOAD STAPLER GOLD 60MM (STAPLE) ×1 IMPLANT
RELOAD STAPLER GREEN 60MM (STAPLE) IMPLANT
RELOAD SUT TRIPLE-STITCH 2-0 (ENDOMECHANICALS) IMPLANT
SCISSORS LAP 5X45 EPIX DISP (ENDOMECHANICALS) ×2 IMPLANT
SET TUBE SMOKE EVAC HIGH FLOW (TUBING) ×2 IMPLANT
SHEARS HARMONIC ACE PLUS 45CM (MISCELLANEOUS) ×2 IMPLANT
SLEEVE ADV FIXATION 5X100MM (TROCAR) ×4 IMPLANT
SLEEVE GASTRECTOMY 40FR VISIGI (MISCELLANEOUS) ×2 IMPLANT
SOL ANTI FOG 6CC (MISCELLANEOUS) ×1 IMPLANT
SOLUTION ANTI FOG 6CC (MISCELLANEOUS) ×1
SPIKE FLUID TRANSFER (MISCELLANEOUS) ×1 IMPLANT
STAPLE LINE REINFORCEMENT LAP (STAPLE) ×6 IMPLANT
STAPLER ECHELON LONG 60 440 (INSTRUMENTS) ×2 IMPLANT
STAPLER RELOAD BLUE 60MM (STAPLE) ×10
STAPLER RELOAD GOLD 60MM (STAPLE) ×2
STAPLER RELOAD GREEN 60MM (STAPLE)
STRIP CLOSURE SKIN 1/2X4 (GAUZE/BANDAGES/DRESSINGS) ×2 IMPLANT
SUT MNCRL AB 4-0 PS2 18 (SUTURE) ×2 IMPLANT
SUT SURGIDAC NAB ES-9 0 48 120 (SUTURE) ×1 IMPLANT
SUT VICRYL 0 TIES 12 18 (SUTURE) ×2 IMPLANT
SYR 10ML ECCENTRIC (SYRINGE) ×2 IMPLANT
SYR 20ML LL LF (SYRINGE) ×2 IMPLANT
SYR 50ML LL SCALE MARK (SYRINGE) ×2 IMPLANT
SYS KII OPTICAL ACCESS 15MM (TROCAR) ×2
SYSTEM KII OPTICAL ACCESS 15MM (TROCAR) ×1 IMPLANT
TOWEL OR 17X26 10 PK STRL BLUE (TOWEL DISPOSABLE) ×2 IMPLANT
TOWEL OR NON WOVEN STRL DISP B (DISPOSABLE) ×2 IMPLANT
TROCAR ADV FIXATION 5X100MM (TROCAR) ×2 IMPLANT
TROCAR Z-THREAD OPTICAL 5X100M (TROCAR) ×2 IMPLANT
TUBING CONNECTING 10 (TUBING) ×2 IMPLANT
TUBING ENDO SMARTCAP (MISCELLANEOUS) ×2 IMPLANT

## 2022-01-23 NOTE — Progress Notes (Signed)
Pt set up on cpap with settings of 8 per home settings.

## 2022-01-23 NOTE — Progress Notes (Signed)
Patient asleep, day of surgery. Provided support and encouragement to support.  Encouraged pulmonary, toilet, ambulation and small sips of liquids.  All questions answered.  Will continue to monitor.

## 2022-01-23 NOTE — Progress Notes (Signed)
PHARMACY CONSULT FOR:  Risk Assessment for Post-Discharge VTE Following Bariatric Surgery  Post-Discharge VTE Risk Assessment: This patient's probability of 30-day post-discharge VTE is increased due to the factors marked:  Sleeve gastrectomy   Liver disorder (transplant, cirrhosis, or nonalcoholic steatohepatitis)   Hx of VTE   Hemorrhage requiring transfusion   GI perforation, leak, or obstruction   ====================================================    Female    Age >/=60 years    BMI >/=50 kg/m2    CHF    Dyspnea at Rest    Paraplegia   X Non-gastric-band surgery    Operation Time >/=3 hr    Return to OR     Length of Stay >/= 3 d   Hypercoagulable condition   Significant venous stasis      Predicted probability of 30-day post-discharge VTE:  0.16 % (MILD)   Recommendation for Discharge: No pharmacologic prophylaxis post-discharge       Sara Chapman is a 46 y.o. 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  No results for input(s): "WBC", "HGB", "HCT", "PLT", "APTT", "CREATININE", "LABCREA", "CREAT24HRUR", "MG", "PHOS", "ALBUMIN", "PROT", "AST", "ALT", "ALKPHOS", "BILITOT", "BILIDIR", "IBILI" in the last 72 hours. Estimated Creatinine Clearance: 59.6 mL/min (A) (by C-G formula based on SCr of 1.06 mg/dL (H)).    Past Medical History:  Diagnosis Date   Anemia    H/O    Anxiety    Bowel perforation (HCC)    Complication of anesthesia    WOKE UP DURING PLACEMENT OF JP DRAIN AND WAS IN SEVERE PAIN FEELING DRAIN PLACEMENT   GERD (gastroesophageal reflux disease)    Headache    MIGRAINES   History of kidney stones    X1   Hypertension    PONV (postoperative nausea and vomiting)    WITH C-SECTION   Psoriatic arthritis (HCC)    Sleep apnea    CPAP   Stomach ulcer      Medications Prior to Admission  Medication Sig Dispense Refill Last Dose   amLODipine (NORVASC) 5 MG tablet Take 5 mg by mouth daily.   01/23/2022 at 0600   Ascorbic Acid (VITAMIN C PO) Take 1 tablet by mouth daily.   Past Week   busPIRone (BUSPAR) 10 MG tablet Take 10 mg by mouth 2 (two) times daily.   01/23/2022 at 0600   chlorthalidone (HYGROTON) 25 MG tablet Take 12.5 mg by mouth daily.   Past Week   Cyanocobalamin (B-12 PO) Take 1 tablet by mouth daily.   Past Week   folic acid (FOLVITE) 1 MG tablet Take 1 mg by mouth daily.   Past Week   INFLIXIMAB IV Inject 1 Dose into the vein See admin instructions. Last dose was 01/03/22, pt should return for next infusion in 2 weeks, then 4  weeks, 5 weeks, 6 weeks, 7 weeks and 8 weeks   01/03/2022   MAGNESIUM PO Take 3 capsules by mouth daily.   Past Week   methotrexate (RHEUMATREX) 2.5 MG tablet Take 10 mg by mouth once a week.   01/16/2022   Multiple Vitamin (MULTIVITAMIN) tablet Take 1 tablet by mouth daily.   Past Week   spironolactone (ALDACTONE) 50 MG tablet Take 50 mg by mouth daily.      topiramate (TOPAMAX) 25 MG tablet Take 25 mg by mouth 2 (two) times daily.   01/23/2022 at 0600   VITAMIN D PO Take 1 tablet by mouth daily.   Past Week       Thank you for allowing pharmacy to be a part of this patient's care.  Selinda Eon, PharmD, BCPS Clinical Pharmacist [place] Please utilize Amion for appropriate phone number to reach the unit pharmacist [facility]) 01/23/2022 1:04 PM

## 2022-01-23 NOTE — Transfer of Care (Signed)
Immediate Anesthesia Transfer of Care Note  Patient: Sara Chapman  Procedure(s) Performed: LAPAROSCOPIC SLEEVE GASTRECTOMY UPPER GI ENDOSCOPY HERNIA REPAIR HIATAL  Patient Location: PACU  Anesthesia Type:General  Level of Consciousness: awake, alert , oriented and patient cooperative  Airway & Oxygen Therapy: Patient Spontanous Breathing and Patient connected to face mask oxygen  Post-op Assessment: Report given to RN and Post -op Vital signs reviewed and stable  Post vital signs: Reviewed and stable  Last Vitals:  Vitals Value Taken Time  BP 111/67 01/23/22 1145  Temp    Pulse 84 01/23/22 1153  Resp 12 01/23/22 1153  SpO2 100 % 01/23/22 1153  Vitals shown include unvalidated device data.  Last Pain:  Vitals:   01/23/22 0819  PainSc: 0-No pain         Complications: No notable events documented.

## 2022-01-24 ENCOUNTER — Other Ambulatory Visit (HOSPITAL_COMMUNITY): Payer: Self-pay

## 2022-01-24 ENCOUNTER — Encounter (HOSPITAL_COMMUNITY): Payer: Self-pay | Admitting: Surgery

## 2022-01-24 LAB — CBC WITH DIFFERENTIAL/PLATELET
Abs Immature Granulocytes: 0.04 10*3/uL (ref 0.00–0.07)
Basophils Absolute: 0 10*3/uL (ref 0.0–0.1)
Basophils Relative: 0 %
Eosinophils Absolute: 0 10*3/uL (ref 0.0–0.5)
Eosinophils Relative: 0 %
HCT: 35.5 % — ABNORMAL LOW (ref 36.0–46.0)
Hemoglobin: 12.3 g/dL (ref 12.0–15.0)
Immature Granulocytes: 0 %
Lymphocytes Relative: 8 %
Lymphs Abs: 0.9 10*3/uL (ref 0.7–4.0)
MCH: 33.5 pg (ref 26.0–34.0)
MCHC: 34.6 g/dL (ref 30.0–36.0)
MCV: 96.7 fL (ref 80.0–100.0)
Monocytes Absolute: 0.3 10*3/uL (ref 0.1–1.0)
Monocytes Relative: 2 %
Neutro Abs: 9.9 10*3/uL — ABNORMAL HIGH (ref 1.7–7.7)
Neutrophils Relative %: 90 %
Platelets: 221 10*3/uL (ref 150–400)
RBC: 3.67 MIL/uL — ABNORMAL LOW (ref 3.87–5.11)
RDW: 13.3 % (ref 11.5–15.5)
WBC: 11.1 10*3/uL — ABNORMAL HIGH (ref 4.0–10.5)
nRBC: 0 % (ref 0.0–0.2)

## 2022-01-24 LAB — COMPREHENSIVE METABOLIC PANEL
ALT: 38 U/L (ref 0–44)
AST: 29 U/L (ref 15–41)
Albumin: 3.2 g/dL — ABNORMAL LOW (ref 3.5–5.0)
Alkaline Phosphatase: 43 U/L (ref 38–126)
Anion gap: 7 (ref 5–15)
BUN: 13 mg/dL (ref 6–20)
CO2: 20 mmol/L — ABNORMAL LOW (ref 22–32)
Calcium: 7.9 mg/dL — ABNORMAL LOW (ref 8.9–10.3)
Chloride: 112 mmol/L — ABNORMAL HIGH (ref 98–111)
Creatinine, Ser: 1.15 mg/dL — ABNORMAL HIGH (ref 0.44–1.00)
GFR, Estimated: 59 mL/min — ABNORMAL LOW (ref >60)
Glucose, Bld: 138 mg/dL — ABNORMAL HIGH (ref 70–99)
Potassium: 4.1 mmol/L (ref 3.5–5.1)
Sodium: 139 mmol/L (ref 135–145)
Total Bilirubin: 0.6 mg/dL (ref 0.3–1.2)
Total Protein: 6.2 g/dL — ABNORMAL LOW (ref 6.5–8.1)

## 2022-01-24 LAB — MAGNESIUM: Magnesium: 1.8 mg/dL (ref 1.7–2.4)

## 2022-01-24 MED ORDER — PANTOPRAZOLE SODIUM 40 MG PO TBEC
40.0000 mg | DELAYED_RELEASE_TABLET | Freq: Every day | ORAL | 0 refills | Status: AC
  Filled 2022-01-24: qty 90, 90d supply, fill #0

## 2022-01-24 MED ORDER — AMLODIPINE BESYLATE 5 MG PO TABS
2.5000 mg | ORAL_TABLET | Freq: Every day | ORAL | Status: DC
Start: 2022-01-24 — End: 2022-01-25
  Administered 2022-01-24 – 2022-01-25 (×2): 2.5 mg via ORAL
  Filled 2022-01-24 (×2): qty 1

## 2022-01-24 MED ORDER — ACETAMINOPHEN 500 MG PO TABS: 1000.0000 mg | ORAL_TABLET | Freq: Three times a day (TID) | ORAL | 0 refills | Status: AC

## 2022-01-24 MED ORDER — GABAPENTIN 100 MG PO CAPS
200.0000 mg | ORAL_CAPSULE | Freq: Two times a day (BID) | ORAL | 0 refills | Status: DC
  Filled 2022-01-24: qty 20, 5d supply, fill #0

## 2022-01-24 MED ORDER — ONDANSETRON 4 MG PO TBDP
4.0000 mg | ORAL_TABLET | Freq: Four times a day (QID) | ORAL | 0 refills | Status: DC | PRN
  Filled 2022-01-24: qty 20, 15d supply, fill #0

## 2022-01-24 MED ORDER — METHOCARBAMOL 500 MG PO TABS
500.0000 mg | ORAL_TABLET | Freq: Four times a day (QID) | ORAL | Status: DC | PRN
  Administered 2022-01-24: 500 mg via ORAL
  Filled 2022-01-24: qty 1

## 2022-01-24 MED ORDER — MAGNESIUM SULFATE 2 GM/50ML IV SOLN
2.0000 g | Freq: Once | INTRAVENOUS | Status: AC
  Administered 2022-01-24: 2 g via INTRAVENOUS
  Filled 2022-01-24: qty 50

## 2022-01-24 MED ORDER — TRAMADOL HCL 50 MG PO TABS
50.0000 mg | ORAL_TABLET | Freq: Four times a day (QID) | ORAL | 0 refills | Status: DC | PRN
  Filled 2022-01-24: qty 10, 3d supply, fill #0

## 2022-01-25 ENCOUNTER — Other Ambulatory Visit (HOSPITAL_COMMUNITY): Payer: Self-pay

## 2022-01-25 MED ORDER — OXYCODONE HCL 5 MG PO TABS
5.0000 mg | ORAL_TABLET | Freq: Three times a day (TID) | ORAL | 0 refills | Status: AC | PRN
  Filled 2022-01-25: qty 15, 5d supply, fill #0

## 2022-01-25 MED ORDER — AMLODIPINE BESYLATE 5 MG PO TABS
2.5000 mg | ORAL_TABLET | Freq: Every day | ORAL | 0 refills | Status: DC
Start: 2022-01-25 — End: 2022-08-17

## 2022-01-25 MED ORDER — METHOCARBAMOL 750 MG PO TABS
750.0000 mg | ORAL_TABLET | Freq: Three times a day (TID) | ORAL | 0 refills | Status: AC
  Filled 2022-01-25: qty 15, 5d supply, fill #0

## 2022-01-25 NOTE — Plan of Care (Signed)

## 2022-01-25 NOTE — Discharge Summary (Signed)
Physician Discharge Summary  Sara Chapman WCH:[id number] DOB: 17-Sep-1975 DOA: 01/23/2022  PCP: Midge Minium, PA  Admit date: 01/23/2022 Discharge date: 01/25/2022   Recommendations for Outpatient Follow-up:    Follow-up Information     Maczis, Carlena Hurl, [place]. Go on 02/21/2022.   Specialty: General Surgery Why: Please arrive 15 minutes prior to your 2:15pm appointment with Dr. Morey Hummingbird information: [place] [address] [phone]         Clovis Riley, MD. Go on 03/16/2022.   Specialty: General Surgery Why: Please arrive 15 minutes prior to your 9:10am appointment Contact information: [address] [place] [address] [place]. Go on 02/03/2022.   Why: This is for your Post-op class Contact information: [address] [place], [place], [place] [address]               Discharge Diagnoses:  Principal Problem:   Morbid obesity ([place])   Surgical Procedure: Laparoscopic Sleeve Gastrectomy, hiatal hernia repair upper endoscopy  Discharge Condition: Good Disposition: Home  Diet recommendation: Postoperative sleeve gastrectomy diet (liquids only)  Filed Weights   01/23/22 0758 01/23/22 1249  Weight: 82.3 kg 82.3 kg     Hospital Course:  The patient was admitted for a planned laparoscopic sleeve gastrectomy. See op note. On the evening of postoperative day 0, the patient was started on water and ice chips. On postoperative day 1 the patient had no fever or tachycardia and was tolerating water in their diet was gradually advanced throughout the day. The patient was ambulating without difficulty. Their vital signs are stable without fever or tachycardia. The patient was maintained on their home settings for CPAP therapy. She was kept in the hospital an additional night due to issues with pain control. On Post-op day 2 she reported ongoing soreness in  the upper abdomen, mostly focused on the camera port site where, of note, an additional transfascial suture had been placed for hemostasis intraoperatively. Her pain was adequately controlled with oral medications. Her vitals remained normal and she was doing well with PO intake with minimal nausea. The patient had received discharge instructions and counseling. They were deemed stable for discharge and had met discharge criteria.  Discharge exam: Vitals:   01/25/22 0146 01/25/22 0541  BP: 93/61 107/70  Pulse: 71 70  Resp: 18 18  Temp: 97.7 F (36.5 C) 97.9 F (36.6 C)  SpO2: 92% 94%   A&O x 3, no distress Unlabored respirations Abdomen soft, nondistended, mildly tender across upper abdomen without guarding or rebound. Incisions c/d/I with no hematoma, ecchymosis, cellulitis or hernia.  Extremities- no edema   Discharge Instructions   Allergies as of 01/25/2022       Reactions   Clonazepam Other (See Comments)   SUICIDAL THOUGHTS   Ibuprofen Other (See Comments)   stomach ulcers   Nsaids Other (See Comments)   GI ulcers   Tolmetin Other (See Comments)   GI ulcers   Pseudoephedrine Hcl Palpitations, Other (See Comments)      Tape Rash   Adhesive tape breaks down the skin and causes sores, use paper tape        Medication List     STOP taking these medications    chlorthalidone 25 MG tablet Commonly known as: HYGROTON   spironolactone 50 MG tablet Commonly known as: ALDACTONE       TAKE these medications  acetaminophen 500 MG tablet Commonly known as: TYLENOL Take 2 tablets (1,000 mg total) by mouth every 8 (eight) hours for 5 days.   amLODipine 5 MG tablet Commonly known as: NORVASC Take 0.5 tablets (2.5 mg total) by mouth daily. What changed: how much to take Notes to patient: Monitor your blood pressure daily and report to your PCP- you may need to rapidly titrate or discontinue blood pressure medications after weight loss surgery   B-12 PO Take 1  tablet by mouth daily.   busPIRone 10 MG tablet Commonly known as: BUSPAR Take 10 mg by mouth 2 (two) times daily.   folic acid 1 MG tablet Commonly known as: FOLVITE Take 1 mg by mouth daily.   gabapentin 100 MG capsule Commonly known as: NEURONTIN Take 2 capsules (200 mg total) by mouth every 12 (twelve) hours.   INFLIXIMAB IV Inject 1 Dose into the vein See admin instructions. Last dose was 01/03/22, pt should return for next infusion in 2 weeks, then 4 weeks, 5 weeks, 6 weeks, 7 weeks and 8 weeks   MAGNESIUM PO Take 3 capsules by mouth daily.   methocarbamol 750 MG tablet Commonly known as: Robaxin-750 Take 1 tablet (750 mg total) by mouth 3 (three) times daily for 5 days.   methotrexate 2.5 MG tablet Commonly known as: RHEUMATREX Take 10 mg by mouth once a week.   multivitamin tablet Take 1 tablet by mouth daily.   ondansetron 4 MG disintegrating tablet Commonly known as: ZOFRAN-ODT Dissolve 1 tablet (4 mg total) by mouth every 6 (six) hours as needed for nausea or vomiting.   oxyCODONE 5 MG immediate release tablet Commonly known as: Roxicodone Take 1 tablet (5 mg total) by mouth every 8 (eight) hours as needed for up to 5 days. Pain not relieved by other medications, rest, or ice/ heat   pantoprazole 40 MG tablet Commonly known as: PROTONIX Take 1 tablet (40 mg total) by mouth daily.   topiramate 25 MG tablet Commonly known as: TOPAMAX Take 25 mg by mouth 2 (two) times daily.   traMADol 50 MG tablet Commonly known as: ULTRAM Take 1 tablet (50 mg total) by mouth every 6 (six) hours as needed (pain).   VITAMIN C PO Take 1 tablet by mouth daily.   VITAMIN D PO Take 1 tablet by mouth daily.        Follow-up Information     Maczis, Carlena Hurl, [place]. Go on 02/21/2022.   Specialty: General Surgery Why: Please arrive 15 minutes prior to your 2:15pm appointment with Dr. Morey Hummingbird information: [place]  [address] [phone]         Clovis Riley, MD. Go on 03/16/2022.   Specialty: General Surgery Why: Please arrive 15 minutes prior to your 9:10am appointment Contact information: [address] [place] [address] [place]. Go on 02/03/2022.   Why: This is for your Post-op class Contact information: [address] [place], [place], [place] [address]                 The results of significant diagnostics from this hospitalization (including imaging, microbiology, ancillary and laboratory) are listed below for reference.    Significant Diagnostic Studies: No results found.  Labs: Basic Metabolic Panel: Recent Labs  Lab 01/24/22 0431  NA 139  K 4.1  CL 112*  CO2 20*  GLUCOSE 138*  BUN 13  CREATININE 1.15*  CALCIUM 7.9*  MG 1.8   Liver Function Tests: Recent Labs  Lab 01/24/22 0431  AST 29  ALT 38  ALKPHOS 43  BILITOT 0.6  PROT 6.2*  ALBUMIN 3.2*    CBC: Recent Labs  Lab 01/24/22 0431  WBC 11.1*  NEUTROABS 9.9*  HGB 12.3  HCT 35.5*  MCV 96.7  PLT 221    CBG: Recent Labs  Lab 01/23/22 0804  GLUCAP 124*    Principal Problem:   Morbid obesity ([place])     Signed:  [facility], [place] 01/25/2022, 8:07 AM

## 2022-01-25 NOTE — Progress Notes (Signed)
Patient alert and oriented, pain is controlled. Patient is tolerating fluids, advanced to protein shake today, patient is tolerating well.  Reviewed Gastric sleeve discharge instructions with patient and patient is able to articulate understanding.  Provided information on BELT program, Support Group and [facility] outpatient pharmacy. All questions answered, will continue to monitor.   24hr fluid recall: .  Per dehydration protocol, will call pt to f/u within one week post op.

## 2022-01-27 ENCOUNTER — Telehealth (HOSPITAL_COMMUNITY): Payer: Self-pay | Admitting: *Deleted

## 2022-01-27 LAB — SURGICAL PATHOLOGY

## 2022-02-03 ENCOUNTER — Encounter: Payer: BC Managed Care – PPO | Attending: Surgery | Admitting: Dietician

## 2022-02-03 ENCOUNTER — Encounter: Payer: Self-pay | Admitting: Dietician

## 2022-02-03 VITALS — Ht <= 58 in | Wt 172.6 lb

## 2022-02-03 DIAGNOSIS — Z6838 Body mass index (BMI) 38.0-38.9, adult: Secondary | ICD-10-CM | POA: Insufficient documentation

## 2022-02-03 DIAGNOSIS — I10 Essential (primary) hypertension: Secondary | ICD-10-CM | POA: Diagnosis not present

## 2022-02-03 NOTE — Progress Notes (Signed)
Nutrition Therapy for Post-Operative Bariatric Diet Follow-up visit:  2 weeks post-op sleeve gastrectomy surgery  Medical Nutrition Therapy:  Appt start time: 0820 end time:  0855  Anthropometrics:  Date 11/30/21 12/16/21 02/03/22  BMI 38.85 37.89 36.07  Weight (lbs) 185.9 181.3 172.6  Skeletal muscle (lbs)  49.4 46.5  % body fat  50.7 50.7   Clinical: Medications: gabapentin, busPIRone (stopped taking other meds to avoid nausea; she plans to gradually restart as needed) Supplementation: bariatric multivitamin (celebrate) 1x daily; calcium 500mg  3x daily Health/ medical history changes: no changes per patient GI symptoms: N/V/D/C: occasional mild nausea, no vomiting, diarrhea, constipation Dumping Syndrome: no Hair loss: no  Dietary/ Lifestyle Progress: Patient reports pain at one of her incision sites, has contacted [facility] and discussed. She also has pain due to attempt to rise from seated position on the floor yesterday. She voices struggling to feel positive about having had surgery, with significance and permanence of lifestyle changes. Tried Ch[employer]ani yogurt and did not like it.  She voices hunger symptoms causing her to not feel well, feels irritable; she feels ready to resintroduce some solid foods.   Dietary recall: Eating pattern: 2 protein shakes daily, taken gradually, soup Dining out: 0 Breakfast: protein shake Snack: water, gatorade zero  Lunch: protein shake  Snack: sugar free pudding  Dinner: cream soup or beef broth Snack: water, gatorade zero  Fluid intake: 40-48oz water, 22oz protein shakes, 10oz gatorade = >70oz Estimated total protein intake: 60g daily Bariatric diet adherence:  Using straws: no Drinking fluids during meals: n/a Carbonated beverages: no  Recent physical activity:  walking daily   Nutrition Intervention:   Reviewed progress since surgery. Discussed reasons for taking pantoprazole as prescribed, and reviewed vitamin/ mineral  recommendations. Discussed emotions regarding lifestyle changes and managing difficult situations Instructed on advancement of diet to include solid proteins.  Discussed adding low carb vegetables only after being able to consume adequate solid protein to meet daily goals.    Nutritional Diagnosis:  [place]-3.3 Overweight/obesity As related to history of excess calories and inadequate physical activity.  As evidenced by patient with current BMI of 36.07, following bariatric diet guidelines for ongoing weight loss after sleeve gastrectomy.  Teaching Method Utilized:  Visual Auditory Hands on  Materials provided: Phase 4 and 5 bariatric diet handouts Visit summary with goals/ instructions  Learning Readiness:  Change in progress  Barriers to learning/adherence to lifestyle change: none  Demonstrated degree of understanding via:  Teach Back      Plan: Implement goals as listed in patient instructions. Return on 03/29/22 at 8:15am for 2 month post-op MNT follow up.

## 2022-02-03 NOTE — Patient Instructions (Signed)
Add tender, moist, lean protein foods in 1-2oz or 1/4 cup portions to start.  Allow up to 30 minutes to eat what you can, then wait 2-3 hours for another meal or snack.  Gradually reduce protein shakes as intake of solid proteins increases.  Include vegetables (low carb) only when able to eat 2oz of protein food first.

## 2022-03-29 ENCOUNTER — Telehealth: Payer: Self-pay | Admitting: Dietician

## 2022-03-29 ENCOUNTER — Ambulatory Visit: Payer: BC Managed Care – PPO | Admitting: Dietician

## 2022-03-29 NOTE — Telephone Encounter (Signed)
Called patient to check on progress after we were forced to cancel her 2 month post-op appt. She is currently out of town and will call back next week to reschedule. She reports doing well with diet and is walking 3.5 miles daily.

## 2022-04-27 ENCOUNTER — Encounter: Payer: Self-pay | Admitting: Dietician

## 2022-04-27 NOTE — Progress Notes (Signed)
Have not heard back from patient to reschedule her cancelled appointment (by our office due to schedule conflict) from 5/91/63. Sent notification to referring provider.

## 2022-05-10 ENCOUNTER — Encounter: Payer: Self-pay | Admitting: Dietician

## 2022-05-10 NOTE — Progress Notes (Signed)
Have not heard back from patient to reschedule her cancelled (by [place]) appointment from 03/29/22. Sent notification to referring provider.

## 2022-07-30 ENCOUNTER — Emergency Department
Admission: EM | Admit: 2022-07-30 | Discharge: 2022-07-30 | Disposition: A | Discharge status: Home / Self Care | Payer: BC Managed Care – PPO | Source: Home / Self Care | Attending: Emergency Medicine | Admitting: Emergency Medicine

## 2022-07-30 ENCOUNTER — Other Ambulatory Visit: Payer: Self-pay

## 2022-07-30 DIAGNOSIS — I10 Essential (primary) hypertension: Secondary | ICD-10-CM | POA: Insufficient documentation

## 2022-07-30 DIAGNOSIS — B0230 Zoster ocular disease, unspecified: Secondary | ICD-10-CM

## 2022-07-30 DIAGNOSIS — A879 Viral meningitis, unspecified: Secondary | ICD-10-CM | POA: Diagnosis not present

## 2022-07-30 DIAGNOSIS — R519 Headache, unspecified: Secondary | ICD-10-CM | POA: Insufficient documentation

## 2022-07-30 DIAGNOSIS — B021 Zoster meningitis: Secondary | ICD-10-CM | POA: Diagnosis not present

## 2022-07-30 DIAGNOSIS — B0239 Other herpes zoster eye disease: Secondary | ICD-10-CM | POA: Insufficient documentation

## 2022-07-30 LAB — CBC WITH DIFFERENTIAL/PLATELET
Abs Immature Granulocytes: 0.01 10*3/uL (ref 0.00–0.07)
Basophils Absolute: 0 10*3/uL (ref 0.0–0.1)
Basophils Relative: 1 %
Eosinophils Absolute: 0.3 10*3/uL (ref 0.0–0.5)
Eosinophils Relative: 5 %
HCT: 44.8 % (ref 36.0–46.0)
Hemoglobin: 14.9 g/dL (ref 12.0–15.0)
Immature Granulocytes: 0 %
Lymphocytes Relative: 27 %
Lymphs Abs: 1.4 10*3/uL (ref 0.7–4.0)
MCH: 31.1 pg (ref 26.0–34.0)
MCHC: 33.3 g/dL (ref 30.0–36.0)
MCV: 93.5 fL (ref 80.0–100.0)
Monocytes Absolute: 0.4 10*3/uL (ref 0.1–1.0)
Monocytes Relative: 8 %
Neutro Abs: 3 10*3/uL (ref 1.7–7.7)
Neutrophils Relative %: 59 %
Platelets: 213 10*3/uL (ref 150–400)
RBC: 4.79 MIL/uL (ref 3.87–5.11)
RDW: 12.6 % (ref 11.5–15.5)
WBC: 5 10*3/uL (ref 4.0–10.5)
nRBC: 0 % (ref 0.0–0.2)

## 2022-07-30 LAB — COMPREHENSIVE METABOLIC PANEL
ALT: 36 U/L (ref 0–44)
AST: 22 U/L (ref 15–41)
Albumin: 3.7 g/dL (ref 3.5–5.0)
Alkaline Phosphatase: 56 U/L (ref 38–126)
Anion gap: 6 (ref 5–15)
BUN: 11 mg/dL (ref 6–20)
CO2: 25 mmol/L (ref 22–32)
Calcium: 8.4 mg/dL — ABNORMAL LOW (ref 8.9–10.3)
Chloride: 108 mmol/L (ref 98–111)
Creatinine, Ser: 1.08 mg/dL — ABNORMAL HIGH (ref 0.44–1.00)
GFR, Estimated: >60 mL/min (ref >60)
Glucose, Bld: 89 mg/dL (ref 70–99)
Potassium: 3.7 mmol/L (ref 3.5–5.1)
Sodium: 139 mmol/L (ref 135–145)
Total Bilirubin: 0.8 mg/dL (ref 0.3–1.2)
Total Protein: 6.8 g/dL (ref 6.5–8.1)

## 2022-07-30 MED ORDER — OXYCODONE-ACETAMINOPHEN 5-325 MG PO TABS: 1.0000 | ORAL_TABLET | ORAL | 0 refills | Status: DC | PRN

## 2022-07-30 MED ORDER — PREDNISOLONE ACETATE 1 % OP SUSP: 2.0000 [drp] | Freq: Two times a day (BID) | OPHTHALMIC | 0 refills | Status: DC

## 2022-07-30 MED ORDER — KETOROLAC TROMETHAMINE 30 MG/ML IJ SOLN
15.0000 mg | Freq: Once | INTRAMUSCULAR | Status: AC
  Administered 2022-07-30: 15 mg via INTRAMUSCULAR
  Filled 2022-07-30: qty 1

## 2022-07-30 MED ORDER — OXYCODONE-ACETAMINOPHEN 5-325 MG PO TABS
1.0000 | ORAL_TABLET | Freq: Once | ORAL | Status: AC
  Administered 2022-07-30: 1 via ORAL
  Filled 2022-07-30: qty 1

## 2022-07-30 MED ORDER — TRAMADOL HCL 50 MG PO TABS
50.0000 mg | ORAL_TABLET | Freq: Once | ORAL | Status: AC
  Administered 2022-07-30: 50 mg via ORAL
  Filled 2022-07-30: qty 1

## 2022-07-30 NOTE — ED Provider Notes (Signed)
[facility] Provider Note    Event Date/Time   First MD Initiated Contact with Patient 07/30/22 1318     (approximate)   History   Eye Pain   HPI  Leeanna Slaby is a 46 y.o. female with a history of hypertension, anemia, anxiety, and migraine headaches who presents with pain to the right side of her head and the right eye for the last several days.  The patient states that initially about a week ago she started having some pain to the top of her head which became acutely more severe on 12/27 and was on the right side of her head and behind the eye.  It is a sharp shooting pain that felt different than her typical migraines.  She was seen at the [facility] ED and had a CT.  She then went to another facility.  The doctor there noticed a red area to her right eyelid and started her on antibiotics as well as antivirals for possible shingles.  The patient states that she initially only was taking the antibiotics.  She started the antiviral yesterday.  Today she noted continued headache and eye pain.  Her vision became somewhat blurry in that eye today as well.  She then noticed a rash to the right forehead and some swelling of the right eyelid.  The patient also has a painful area to the right side of her neck.  However, she denies any fever or chills.  She has no neck stiffness or pain on range of motion of the neck.  She denies any difficulty swallowing or shortness of breath.  I reviewed the past medical records.  The patient was seen by family medicine on 12/26 for routine follow-up.  Subsequently she was seen for acute onset of headache at urgent care on 12/27 and subsequently went to the [facility] ED.  She had a CT head that was negative.  This was done within 6 hours of the onset of her headache.  I do not see a provider note from that visit.   Physical Exam   Triage Vital Signs: ED Triage Vitals  Enc Vitals Group     BP 07/30/22 1151 (!) 118/90      Pulse Rate 07/30/22 1151 97     Resp 07/30/22 1151 18     Temp 07/30/22 1157 98.4 F (36.9 C)     Temp Source 07/30/22 1157 Oral     SpO2 07/30/22 1151 98 %     Weight 07/30/22 1155 144 lb (65.3 kg)     Height 07/30/22 1155 4\' 10"  (1.473 m)     Head Circumference --      Peak Flow --      Pain Score 07/30/22 1155 10     Pain Loc --      Pain Edu? --      Excl. in GC? --     Most recent vital signs: Vitals:   07/30/22 1151 07/30/22 1157  BP: (!) 118/90   Pulse: 97   Resp: 18   Temp:  98.4 F (36.9 C)  SpO2: 98%      General: Alert and oriented, relatively well-appearing. CV:  Good peripheral perfusion.  Resp:  Normal effort.  Abd:  No distention.  Other:  EOMI.  PERRLA.  Right eye minimally injected.  Right eyelid with mild swelling.  Erythematous patchy rash to right half of forehead and right eyelid, not crossing midline.  Right anterior cervical lymphadenopathy.  No other neck mass, swelling, or tenderness.  Full range of motion of the neck.  No meningeal signs.  No facial droop.  Motor and sensory intact in all extremities.  Normal gait.   ED Results / Procedures / Treatments   Labs (all labs ordered are listed, but only abnormal results are displayed) Labs Reviewed  COMPREHENSIVE METABOLIC PANEL - Abnormal; Notable for the following components:      Result Value   Creatinine, Ser 1.08 (*)    Calcium 8.4 (*)    All other components within normal limits  CBC WITH DIFFERENTIAL/PLATELET     EKG     RADIOLOGY     PROCEDURES:  Critical Care performed: No  Procedures   MEDICATIONS ORDERED IN ED: Medications  ketorolac (TORADOL) 30 MG/ML injection 15 mg (has no administration in time range)  traMADol (ULTRAM) tablet 50 mg (50 mg Oral Given 07/30/22 1414)  oxyCODONE-acetaminophen (PERCOCET/ROXICET) 5-325 MG per tablet 1 tablet (1 tablet Oral Given 07/30/22 1518)     IMPRESSION / MDM / ASSESSMENT AND PLAN / ED COURSE  I reviewed the triage vital  signs and the nursing notes.  46 year old female with PMH as noted above presents with a headache that acutely worsened 4 days ago associated with blurred vision in her right eye, and a rash to the right upper part of her face that started today.  She has been on Valtrex since yesterday.  Exam is as above.  Visual acuity is 20/40 in the left eye and 20/70 in the affected right eye.  Differential diagnosis includes, but is not limited to, zoster ophthalmicus, less likely facial cellulitis.  The patient may have a mild iritis but there is no evidence of conjunctivitis.  The area of neck pain is consistent with anterior cervical lymphadenopathy.  Basic labs were obtained from triage and do not show any leukocytosis or abnormal electrolytes.  The patient had a negative CT on 12/27.  There is no indication for repeat imaging.  I will consult ophthalmology for further recommendations.  Patient's presentation is most consistent with acute presentation with potential threat to life or bodily function.  ----------------------------------------- 3:25 PM on 07/30/2022 -----------------------------------------  The patient has had minimal relief with tramadol so I have ordered Percocet and a dose of Toradol.  I consulted and discussed the case with Dr. Druscilla Brownie from ophthalmology.  He agrees with the current management.  He recommends to continue the oral valacyclovir and also advises that we can start the patient on prednisolone eyedrops twice daily.  He recommends that the patient follow-up with ophthalmology in 2 days.  I counseled the patient on the results of the workup, the ophthalmology consult and recommendations, and medications.  I will prescribe the prednisone eyedrops and the Percocet.  I emphasized the importance of continuing the oral valacyclovir and confirmed that the patient is on the correct dose.  I gave the patient strict return precautions and she expressed understanding.  She is stable  for discharge at this time.   FINAL CLINICAL IMPRESSION(S) / ED DIAGNOSES   Final diagnoses:  Zoster ophthalmicus  Acute nonintractable headache, unspecified headache type     Rx / DC Orders   ED Discharge Orders          Ordered    prednisoLONE acetate (PREDNISOLONE ACETATE P-F) 1 % ophthalmic suspension  2 times daily        07/30/22 1520    oxyCODONE-acetaminophen (PERCOCET) 5-325 MG tablet  Every 4 hours PRN  07/30/22 1522             Note:  This document was prepared using Dragon voice recognition software and may include unintentional dictation errors.    Dionne Bucy, MD 07/30/22 1527

## 2022-07-30 NOTE — Discharge Instructions (Addendum)
You have shingles to the face and eye.  This is called zoster ophthalmicus.  It can cause permanent problems with your vision if not treated appropriately.  It is very important that you continue taking the valacyclovir that you were prescribed.  You should also use the prednisone eyedrops prescribed today twice daily until following up with the eye doctor.  Take the Percocet as needed for pain.  Follow-up with the eye doctor at [facility] on Tuesday.  We talked to Dr. Druscilla Brownie about your case today.  He wants you to come to the office on Tuesday morning when they open at 8 AM, and let them know that you were seen in the emergency department over the weekend and need to be followed up.  Return to the ER immediately for new, worsening, or persistent severe pain, worsening of your vision, worsening rash or swelling, fever or chills, worsening pain or swelling in the neck, or any other new or worsening symptoms that concern you.

## 2022-07-30 NOTE — ED Notes (Signed)
Visual acuity performed by EDP. 20/40 L eye, 20/70 R eye.

## 2022-07-30 NOTE — ED Triage Notes (Signed)
Pt states that she has had a severe headache since Tuesday that's different from her normal migraines and went to [facility] Wednesday for it- pt states that she was sent to [facility] [place] but left d/t wait and [facility]- pt had a CT and the provider noticed a red spot on her R eye and was told she had a bacterial infection- pt was given and has been taking antibiotics and antivirals for it- however, her headache is still there and her eye has gotten worse- pt has noticeable swelling to her R eye- pt also now had neck pain and she states she feels a lump on the R side of her neck

## 2022-07-30 NOTE — ED Notes (Addendum)
See triage note. Pt states has had migraine since Tuesday. On R side of head goes down back of head and neck with knot on back of head and swelling and rash on front of forehead and eye. Pt has not missed any of her antibiotics she was prescribed.

## 2022-07-31 ENCOUNTER — Emergency Department: Payer: BC Managed Care – PPO

## 2022-07-31 ENCOUNTER — Encounter: Payer: Self-pay | Admitting: Emergency Medicine

## 2022-07-31 ENCOUNTER — Inpatient Hospital Stay
Admission: EM | Admit: 2022-07-31 | Discharge: 2022-08-17 | DRG: 075 | Disposition: A | Discharge status: Home / Self Care | Payer: BC Managed Care – PPO | Attending: Internal Medicine | Admitting: Internal Medicine

## 2022-07-31 DIAGNOSIS — J157 Pneumonia due to Mycoplasma pneumoniae: Secondary | ICD-10-CM | POA: Diagnosis present

## 2022-07-31 DIAGNOSIS — B0230 Zoster ocular disease, unspecified: Secondary | ICD-10-CM | POA: Diagnosis present

## 2022-07-31 DIAGNOSIS — L4050 Arthropathic psoriasis, unspecified: Secondary | ICD-10-CM | POA: Diagnosis present

## 2022-07-31 DIAGNOSIS — J181 Lobar pneumonia, unspecified organism: Secondary | ICD-10-CM

## 2022-07-31 DIAGNOSIS — I10 Essential (primary) hypertension: Secondary | ICD-10-CM | POA: Diagnosis present

## 2022-07-31 DIAGNOSIS — Z6830 Body mass index (BMI) 30.0-30.9, adult: Secondary | ICD-10-CM

## 2022-07-31 DIAGNOSIS — R519 Headache, unspecified: Secondary | ICD-10-CM | POA: Diagnosis present

## 2022-07-31 DIAGNOSIS — F32A Depression, unspecified: Secondary | ICD-10-CM | POA: Diagnosis present

## 2022-07-31 DIAGNOSIS — Z8669 Personal history of other diseases of the nervous system and sense organs: Secondary | ICD-10-CM

## 2022-07-31 DIAGNOSIS — B021 Zoster meningitis: Principal | ICD-10-CM | POA: Diagnosis present

## 2022-07-31 DIAGNOSIS — E669 Obesity, unspecified: Secondary | ICD-10-CM | POA: Diagnosis present

## 2022-07-31 DIAGNOSIS — G4700 Insomnia, unspecified: Secondary | ICD-10-CM | POA: Diagnosis present

## 2022-07-31 DIAGNOSIS — H547 Unspecified visual loss: Secondary | ICD-10-CM | POA: Diagnosis present

## 2022-07-31 DIAGNOSIS — A879 Viral meningitis, unspecified: Principal | ICD-10-CM | POA: Diagnosis present

## 2022-07-31 DIAGNOSIS — F419 Anxiety disorder, unspecified: Secondary | ICD-10-CM | POA: Diagnosis present

## 2022-07-31 DIAGNOSIS — Z1152 Encounter for screening for COVID-19: Secondary | ICD-10-CM

## 2022-07-31 DIAGNOSIS — K219 Gastro-esophageal reflux disease without esophagitis: Secondary | ICD-10-CM | POA: Diagnosis present

## 2022-07-31 LAB — URINALYSIS, COMPLETE (UACMP) WITH MICROSCOPIC
Bacteria, UA: NONE SEEN
Bilirubin Urine: NEGATIVE
Glucose, UA: NEGATIVE mg/dL
Ketones, ur: NEGATIVE mg/dL
Leukocytes,Ua: NEGATIVE
Nitrite: NEGATIVE
Protein, ur: NEGATIVE mg/dL
Specific Gravity, Urine: 1.028 (ref 1.005–1.030)
pH: 6 (ref 5.0–8.0)

## 2022-07-31 LAB — PROCALCITONIN: Procalcitonin: <0.1 ng/mL

## 2022-07-31 LAB — COMPREHENSIVE METABOLIC PANEL
ALT: 28 U/L (ref 0–44)
AST: 20 U/L (ref 15–41)
Albumin: 3.7 g/dL (ref 3.5–5.0)
Alkaline Phosphatase: 62 U/L (ref 38–126)
Anion gap: 9 (ref 5–15)
BUN: 9 mg/dL (ref 6–20)
CO2: 22 mmol/L (ref 22–32)
Calcium: 8.3 mg/dL — ABNORMAL LOW (ref 8.9–10.3)
Chloride: 106 mmol/L (ref 98–111)
Creatinine, Ser: 0.98 mg/dL (ref 0.44–1.00)
GFR, Estimated: >60 mL/min (ref >60)
Glucose, Bld: 88 mg/dL (ref 70–99)
Potassium: 3.4 mmol/L — ABNORMAL LOW (ref 3.5–5.1)
Sodium: 137 mmol/L (ref 135–145)
Total Bilirubin: 0.7 mg/dL (ref 0.3–1.2)
Total Protein: 7 g/dL (ref 6.5–8.1)

## 2022-07-31 LAB — RESP PANEL BY RT-PCR (RSV, FLU A&B, COVID)  RVPGX2
Influenza A by PCR: NEGATIVE
Influenza B by PCR: NEGATIVE
Resp Syncytial Virus by PCR: NEGATIVE
SARS Coronavirus 2 by RT PCR: NEGATIVE

## 2022-07-31 LAB — SEDIMENTATION RATE: Sed Rate: 6 mm/hr (ref 0–20)

## 2022-07-31 LAB — LACTIC ACID, PLASMA: Lactic Acid, Venous: 1.1 mmol/L (ref 0.5–1.9)

## 2022-07-31 LAB — CBC WITH DIFFERENTIAL/PLATELET
Abs Immature Granulocytes: 0.02 10*3/uL (ref 0.00–0.07)
Basophils Absolute: 0 10*3/uL (ref 0.0–0.1)
Basophils Relative: 1 %
Eosinophils Absolute: 0.3 10*3/uL (ref 0.0–0.5)
Eosinophils Relative: 4 %
HCT: 43.7 % (ref 36.0–46.0)
Hemoglobin: 14.8 g/dL (ref 12.0–15.0)
Immature Granulocytes: 0 %
Lymphocytes Relative: 26 %
Lymphs Abs: 1.6 10*3/uL (ref 0.7–4.0)
MCH: 31.6 pg (ref 26.0–34.0)
MCHC: 33.9 g/dL (ref 30.0–36.0)
MCV: 93.2 fL (ref 80.0–100.0)
Monocytes Absolute: 0.4 10*3/uL (ref 0.1–1.0)
Monocytes Relative: 7 %
Neutro Abs: 3.8 10*3/uL (ref 1.7–7.7)
Neutrophils Relative %: 62 %
Platelets: 208 10*3/uL (ref 150–400)
RBC: 4.69 MIL/uL (ref 3.87–5.11)
RDW: 12.6 % (ref 11.5–15.5)
WBC: 6.1 10*3/uL (ref 4.0–10.5)
nRBC: 0 % (ref 0.0–0.2)

## 2022-07-31 LAB — POC URINE PREG, ED: Preg Test, Ur: NEGATIVE

## 2022-07-31 MED ORDER — SODIUM CHLORIDE 0.9 % IV BOLUS (SEPSIS)
1000.0000 mL | Freq: Once | INTRAVENOUS | Status: AC
  Administered 2022-07-31: 1000 mL via INTRAVENOUS

## 2022-07-31 MED ORDER — DEXTROSE 5 % IV SOLN
450.0000 mg | Freq: Once | INTRAVENOUS | Status: AC
  Administered 2022-07-31: 450 mg via INTRAVENOUS
  Filled 2022-07-31: qty 9

## 2022-07-31 MED ORDER — VANCOMYCIN HCL IN DEXTROSE 1-5 GM/200ML-% IV SOLN
1000.0000 mg | Freq: Once | INTRAVENOUS | Status: DC
  Filled 2022-07-31: qty 200

## 2022-07-31 MED ORDER — HYDROMORPHONE HCL 1 MG/ML IJ SOLN
1.0000 mg | INTRAMUSCULAR | Status: AC
  Administered 2022-07-31: 1 mg via INTRAVENOUS
  Filled 2022-07-31: qty 1

## 2022-07-31 MED ORDER — MORPHINE SULFATE (PF) 4 MG/ML IV SOLN
4.0000 mg | Freq: Once | INTRAVENOUS | Status: AC
  Administered 2022-07-31: 4 mg via INTRAVENOUS
  Filled 2022-07-31: qty 1

## 2022-07-31 MED ORDER — DEXAMETHASONE SODIUM PHOSPHATE 10 MG/ML IJ SOLN
10.0000 mg | Freq: Once | INTRAMUSCULAR | Status: AC
  Administered 2022-07-31: 10 mg via INTRAVENOUS
  Filled 2022-07-31: qty 1

## 2022-07-31 MED ORDER — VANCOMYCIN HCL 1250 MG/250ML IV SOLN
1250.0000 mg | Freq: Once | INTRAVENOUS | Status: AC
  Administered 2022-07-31: 1250 mg via INTRAVENOUS
  Filled 2022-07-31: qty 250

## 2022-07-31 MED ORDER — ONDANSETRON HCL 4 MG/2ML IJ SOLN
4.0000 mg | Freq: Once | INTRAMUSCULAR | Status: AC
  Administered 2022-07-31: 4 mg via INTRAVENOUS
  Filled 2022-07-31: qty 2

## 2022-07-31 MED ORDER — SODIUM CHLORIDE 0.9 % IV SOLN
2.0000 g | Freq: Once | INTRAVENOUS | Status: AC
  Administered 2022-07-31: 2 g via INTRAVENOUS
  Filled 2022-07-31: qty 20

## 2022-07-31 MED ORDER — IOHEXOL 300 MG/ML  SOLN
100.0000 mL | Freq: Once | INTRAMUSCULAR | Status: AC | PRN
  Administered 2022-07-31: 100 mL via INTRAVENOUS

## 2022-07-31 NOTE — Sepsis Progress Note (Signed)
Elink monitoring for the code sepsis protocol.  

## 2022-07-31 NOTE — Consult Note (Signed)
PHARMACY -  BRIEF ANTIBIOTIC NOTE   Pharmacy has received consult(s) for vancomycin and acyclovir from an ED provider.  The patient's profile has been reviewed for ht/wt/allergies/indication/available labs.    One time order(s) placed for vancomycin 1250mg  IV x1 and acyclovir 450mg  IV x1  Further antibiotics/pharmacy consults should be ordered by admitting physician if indicated.                       Thank you, Darrick Penna 07/31/2022  8:56 PM

## 2022-07-31 NOTE — ED Notes (Signed)
MD Joni Fears at bedside for procedure at this time. Consent obtained from pt

## 2022-07-31 NOTE — ED Notes (Signed)
Pt to CT at this time via stretcher

## 2022-07-31 NOTE — Code Documentation (Signed)
CODE SEPSIS - PHARMACY COMMUNICATION  **Broad Spectrum Antibiotics should be administered within 1 hour of Sepsis diagnosis**  Time Code Sepsis Called/Page Received: 2053  Antibiotics Ordered: acyclovir, ceftriaxone, vancomycin  Time of 1st antibiotic administration: 2130   Darrick Penna ,PharmD Clinical Pharmacist  07/31/2022  9:44 PM

## 2022-07-31 NOTE — ED Triage Notes (Signed)
Pt reports she was diagnosed with shingles in her right eye. Was told to go to ENT in the AM but pts right eye swollen shut, swelling increased as well as development of fever and increase in pain with change in ability to remember things. ED MD Joni Fears contacted for orders.   Last Tylenol taken 1 hour prior to arrival.

## 2022-07-31 NOTE — ED Provider Notes (Signed)
[facility] Provider Note    Event Date/Time   First MD Initiated Contact with Patient 07/31/22 2124     (approximate)   History   Chief Complaint: Herpes Zoster, Code Sepsis, Cellulitis, and Eye Problem   HPI  Sara Chapman is a 47 y.o. female with a past history of GERD, hypertension, anxiety, diverticulosis who was recently diagnosed with right facial shingles involving the right eye yesterday.  She has been feeling sick for about 4 days.  She initially was seen at [facility] ED, had a CT scan of the head without contrast which was unremarkable.  Started initially on some antibiotics, but yesterday as the diagnosis became more apparent she was diagnosed with shingles, started on antivirals.  Today, she noticed rapid progression with worsening of pain, redness, swelling of the right face including periorbital swelling that is caused her to be unable to open the right eye.  Also complains of pain in the right eye.  Also notes waxing and waning confusion throughout the day and thought process feeling foggy.  She reports a fever of 102 at home, but she took ibuprofen and Tylenol 1 hour prior to arrival in the emergency department.      Physical Exam   Triage Vital Signs: ED Triage Vitals [07/31/22 2054]  Enc Vitals Group     BP (!) 134/98     Pulse Rate 99     Resp 16     Temp 98.9 F (37.2 C)     Temp Source Oral     SpO2 98 %     Weight 144 lb (65.3 kg)     Height 4\' 10"  (1.473 m)     Head Circumference      Peak Flow      Pain Score      Pain Loc      Pain Edu?      Excl. in [place]?     Most recent vital signs: Vitals:   07/31/22 2230 07/31/22 2308  BP: (!) 135/97 (!) 141/99  Pulse: 73 81  Resp: 12 14  Temp:  98.5 F (36.9 C)  SpO2: 97% 96%    General: Awake, no distress.  CV:  Good peripheral perfusion.  Tachycardia heart rate 100 Resp:  Normal effort.   Abd:  No distention.  Other:  Pronounced right facial periorbital  edema involving the forehead to the upper maxilla.  There is no crepitus.  Eye surface appears normal when the eyelids are gently separated.  PERRL, EOMI.  There is pain in the right eye with bright light challenge to the left eye. Patient also exhibits pain with range of motion of the neck particularly with flexion. Right external canal is mildly erythematous without vesicular eruption.  TM is normal and intact. There is right submandibular lymphadenopathy.   ED Results / Procedures / Treatments   Labs (all labs ordered are listed, but only abnormal results are displayed) Labs Reviewed  COMPREHENSIVE METABOLIC PANEL - Abnormal; Notable for the following components:      Result Value   Potassium 3.4 (*)    Calcium 8.3 (*)    All other components within normal limits  RESP PANEL BY RT-PCR (RSV, FLU A&B, COVID)  RVPGX2  CULTURE, BLOOD (ROUTINE X 2)  CULTURE, BLOOD (ROUTINE X 2)  URINE CULTURE  CSF CULTURE W GRAM STAIN  LACTIC ACID, PLASMA  CBC WITH DIFFERENTIAL/PLATELET  PROCALCITONIN  SEDIMENTATION RATE  URINALYSIS, COMPLETE (UACMP) WITH MICROSCOPIC  PROTIME-INR  APTT  CSF CELL COUNT WITH DIFFERENTIAL  CSF CELL COUNT WITH DIFFERENTIAL  PROTEIN AND GLUCOSE, CSF  HSV 1/2 PCR, CSF  POC URINE PREG, ED     EKG    RADIOLOGY Chest x-ray interpreted by me, appears normal.  Radiology report reviewed  CT head and orbits with contrast pending   PROCEDURES:  .Critical Care  Performed by: Carrie Mew, MD Authorized by: Carrie Mew, MD   Critical care provider statement:    Critical care time (minutes):  35   Critical care time was exclusive of:  Separately billable procedures and treating other patients   Critical care was necessary to treat or prevent imminent or life-threatening deterioration of the following conditions:  Sepsis and CNS failure or compromise   Critical care was time spent personally by me on the following activities:  Development of treatment  plan with patient or surrogate, discussions with consultants, evaluation of patient's response to treatment, examination of patient, obtaining history from patient or surrogate, ordering and performing treatments and interventions, ordering and review of laboratory studies, ordering and review of radiographic studies, pulse oximetry, re-evaluation of patient's condition and review of old charts   Care discussed with: admitting provider   Comments:         .Lumbar Puncture  Date/Time: 07/31/2022 11:32 PM  Performed by: Carrie Mew, MD Authorized by: Carrie Mew, MD   Consent:    Consent obtained:  Verbal   Consent given by:  Patient   Risks discussed:  Bleeding, infection, pain, nerve damage and headache   Alternatives discussed:  Alternative treatment Universal protocol:    Patient identity confirmed:  Verbally with patient and arm band Pre-procedure details:    Procedure purpose:  Diagnostic   Preparation: Patient was prepped and draped in usual sterile fashion   Anesthesia:    Anesthesia method:  Local infiltration   Local anesthetic:  Lidocaine 1% w/o epi Procedure details:    Lumbar space:  L4-L5 interspace   Patient position:  L lateral decubitus   Needle gauge:  20   Needle type:  Spinal needle - Quincke tip   Needle length (in):  3.5   Ultrasound guidance: no     Number of attempts:  1   Opening pressure (cm H2O):  35   Fluid appearance:  Clear   Tubes of fluid:  4   Total volume (ml):  10 Post-procedure details:    Puncture site:  Adhesive bandage applied and direct pressure applied   Procedure completion:  Tolerated well, no immediate complications    MEDICATIONS ORDERED IN ED: Medications  vancomycin (VANCOREADY) IVPB 1250 mg/250 mL (has no administration in time range)  sodium chloride 0.9 % bolus 1,000 mL (0 mLs Intravenous Stopped 07/31/22 2216)  dexamethasone (DECADRON) injection 10 mg (10 mg Intravenous Given 07/31/22 2128)  cefTRIAXone (ROCEPHIN)  2 g in sodium chloride 0.9 % 100 mL IVPB (0 g Intravenous Stopped 07/31/22 2216)  acyclovir (ZOVIRAX) 450 mg in dextrose 5 % 100 mL IVPB (450 mg Intravenous New Bag/Given 07/31/22 2215)  iohexol (OMNIPAQUE) 300 MG/ML solution 100 mL (100 mLs Intravenous Contrast Given 07/31/22 2145)  morphine (PF) 4 MG/ML injection 4 mg (4 mg Intravenous Given 07/31/22 2210)  ondansetron (ZOFRAN) injection 4 mg (4 mg Intravenous Given 07/31/22 2210)  HYDROmorphone (DILAUDID) injection 1 mg (1 mg Intravenous Given 07/31/22 2246)     IMPRESSION / MDM / [place] / ED COURSE  I reviewed the triage vital signs and the nursing notes.  Differential diagnosis includes, but is not limited to, orbital cellulitis, preseptal cellulitis, meningitis, encephalitis, pneumonia, sepsis  Patient's presentation is most consistent with acute presentation with potential threat to life or bodily function.  Patient presents with worsening facial symptoms suspected to be progression of zoster outbreak.  However with fever, increased confusion, meningeal symptoms, will need to start an empiric Rocephin, vancomycin, acyclovir, perform lumbar puncture, plan to admit.   Clinical Course as of 07/31/22 2333  Mon Jul 31, 2022  2238 CT head and orbits unremarkable, just shows evidence of preseptal cellulitis. [PS]  2318 LP complete. OP 35. Clear fluid, c/w viral meningitis.  [PS]    Clinical Course User Index [PS] Sharman Cheek, MD     FINAL CLINICAL IMPRESSION(S) / ED DIAGNOSES   Final diagnoses:  Viral meningitis     Rx / DC Orders   ED Discharge Orders     None        Note:  This document was prepared using Dragon voice recognition software and may include unintentional dictation errors.   Sharman Cheek, MD 07/31/22 [phone]

## 2022-08-01 DIAGNOSIS — G4700 Insomnia, unspecified: Secondary | ICD-10-CM | POA: Diagnosis present

## 2022-08-01 DIAGNOSIS — B021 Zoster meningitis: Secondary | ICD-10-CM | POA: Diagnosis present

## 2022-08-01 DIAGNOSIS — H547 Unspecified visual loss: Secondary | ICD-10-CM | POA: Diagnosis present

## 2022-08-01 DIAGNOSIS — K219 Gastro-esophageal reflux disease without esophagitis: Secondary | ICD-10-CM | POA: Diagnosis present

## 2022-08-01 DIAGNOSIS — Z1152 Encounter for screening for COVID-19: Secondary | ICD-10-CM | POA: Diagnosis not present

## 2022-08-01 DIAGNOSIS — R519 Headache, unspecified: Secondary | ICD-10-CM | POA: Diagnosis not present

## 2022-08-01 DIAGNOSIS — F32A Depression, unspecified: Secondary | ICD-10-CM | POA: Diagnosis present

## 2022-08-01 DIAGNOSIS — A879 Viral meningitis, unspecified: Principal | ICD-10-CM

## 2022-08-01 DIAGNOSIS — Z8669 Personal history of other diseases of the nervous system and sense organs: Secondary | ICD-10-CM | POA: Diagnosis not present

## 2022-08-01 DIAGNOSIS — I10 Essential (primary) hypertension: Secondary | ICD-10-CM | POA: Diagnosis present

## 2022-08-01 DIAGNOSIS — F419 Anxiety disorder, unspecified: Secondary | ICD-10-CM | POA: Diagnosis present

## 2022-08-01 DIAGNOSIS — E669 Obesity, unspecified: Secondary | ICD-10-CM | POA: Diagnosis present

## 2022-08-01 DIAGNOSIS — B0230 Zoster ocular disease, unspecified: Secondary | ICD-10-CM | POA: Diagnosis present

## 2022-08-01 DIAGNOSIS — Z6830 Body mass index (BMI) 30.0-30.9, adult: Secondary | ICD-10-CM | POA: Diagnosis not present

## 2022-08-01 DIAGNOSIS — L4050 Arthropathic psoriasis, unspecified: Secondary | ICD-10-CM | POA: Diagnosis present

## 2022-08-01 DIAGNOSIS — J157 Pneumonia due to Mycoplasma pneumoniae: Secondary | ICD-10-CM | POA: Diagnosis present

## 2022-08-01 DIAGNOSIS — J181 Lobar pneumonia, unspecified organism: Secondary | ICD-10-CM | POA: Diagnosis not present

## 2022-08-01 LAB — CSF CELL COUNT WITH DIFFERENTIAL
Eosinophils, CSF: 0 %
Eosinophils, CSF: 0 %
Lymphs, CSF: 57 %
Lymphs, CSF: 71 %
Monocyte-Macrophage-Spinal Fluid: 13 %
Monocyte-Macrophage-Spinal Fluid: 29 %
RBC Count, CSF: 134 /mm3 — ABNORMAL HIGH (ref 0–3)
RBC Count, CSF: 17 /mm3 — ABNORMAL HIGH (ref 0–3)
Segmented Neutrophils-CSF: 0 %
Segmented Neutrophils-CSF: 30 %
Tube #: 1
Tube #: 4
WBC, CSF: 0 /mm3 (ref 0–5)
WBC, CSF: 5 /mm3 (ref 0–5)

## 2022-08-01 LAB — CBC
HCT: 40.9 % (ref 36.0–46.0)
Hemoglobin: 13.8 g/dL (ref 12.0–15.0)
MCH: 32.2 pg (ref 26.0–34.0)
MCHC: 33.7 g/dL (ref 30.0–36.0)
MCV: 95.6 fL (ref 80.0–100.0)
Platelets: 188 10*3/uL (ref 150–400)
RBC: 4.28 MIL/uL (ref 3.87–5.11)
RDW: 12.4 % (ref 11.5–15.5)
WBC: 5.5 10*3/uL (ref 4.0–10.5)
nRBC: 0 % (ref 0.0–0.2)

## 2022-08-01 LAB — PROTIME-INR
INR: 1.1 (ref 0.8–1.2)
Prothrombin Time: 14.5 seconds (ref 11.4–15.2)

## 2022-08-01 LAB — APTT: aPTT: 32 seconds (ref 24–36)

## 2022-08-01 LAB — BASIC METABOLIC PANEL
Anion gap: 7 (ref 5–15)
BUN: 8 mg/dL (ref 6–20)
CO2: 21 mmol/L — ABNORMAL LOW (ref 22–32)
Calcium: 8.2 mg/dL — ABNORMAL LOW (ref 8.9–10.3)
Chloride: 108 mmol/L (ref 98–111)
Creatinine, Ser: 0.87 mg/dL (ref 0.44–1.00)
GFR, Estimated: >60 mL/min (ref >60)
Glucose, Bld: 137 mg/dL — ABNORMAL HIGH (ref 70–99)
Potassium: 4.2 mmol/L (ref 3.5–5.1)
Sodium: 136 mmol/L (ref 135–145)

## 2022-08-01 LAB — PROTEIN AND GLUCOSE, CSF
Glucose, CSF: 55 mg/dL (ref 40–70)
Total  Protein, CSF: 25 mg/dL (ref 15–45)

## 2022-08-01 MED ORDER — PANTOPRAZOLE SODIUM 40 MG PO TBEC
40.0000 mg | DELAYED_RELEASE_TABLET | Freq: Every day | ORAL | Status: DC
  Administered 2022-08-01 – 2022-08-17 (×17): 40 mg via ORAL
  Filled 2022-08-01 (×17): qty 1

## 2022-08-01 MED ORDER — FOLIC ACID 1 MG PO TABS
1.0000 mg | ORAL_TABLET | Freq: Every day | ORAL | Status: DC
  Administered 2022-08-01 – 2022-08-17 (×17): 1 mg via ORAL
  Filled 2022-08-01 (×17): qty 1

## 2022-08-01 MED ORDER — HYDROMORPHONE HCL 1 MG/ML IJ SOLN
0.5000 mg | INTRAMUSCULAR | Status: DC | PRN
  Administered 2022-08-01 – 2022-08-05 (×21): 1 mg via INTRAVENOUS
  Filled 2022-08-01 (×21): qty 1

## 2022-08-01 MED ORDER — BUPROPION HCL ER (XL) 150 MG PO TB24
150.0000 mg | ORAL_TABLET | Freq: Every day | ORAL | Status: DC
  Administered 2022-08-01 – 2022-08-17 (×17): 150 mg via ORAL
  Filled 2022-08-01 (×18): qty 1

## 2022-08-01 MED ORDER — BUSPIRONE HCL 10 MG PO TABS
10.0000 mg | ORAL_TABLET | Freq: Two times a day (BID) | ORAL | Status: DC
  Administered 2022-08-01 – 2022-08-17 (×34): 10 mg via ORAL
  Filled 2022-08-01 (×10): qty 1
  Filled 2022-08-01: qty 2
  Filled 2022-08-01: qty 1
  Filled 2022-08-01: qty 2
  Filled 2022-08-01: qty 1
  Filled 2022-08-01: qty 2
  Filled 2022-08-01 (×15): qty 1
  Filled 2022-08-01: qty 2
  Filled 2022-08-01 (×3): qty 1

## 2022-08-01 MED ORDER — OXYCODONE-ACETAMINOPHEN 5-325 MG PO TABS
1.0000 | ORAL_TABLET | ORAL | Status: DC | PRN
  Administered 2022-08-01 – 2022-08-05 (×16): 1 via ORAL
  Filled 2022-08-01 (×17): qty 1

## 2022-08-01 MED ORDER — ONDANSETRON 4 MG PO TBDP
4.0000 mg | ORAL_TABLET | Freq: Four times a day (QID) | ORAL | Status: DC | PRN
  Administered 2022-08-01: 4 mg via ORAL
  Filled 2022-08-01: qty 1

## 2022-08-01 MED ORDER — SODIUM CHLORIDE 0.9 % IV SOLN
2.0000 g | Freq: Two times a day (BID) | INTRAVENOUS | Status: DC
  Administered 2022-08-01 – 2022-08-02 (×3): 2 g via INTRAVENOUS
  Filled 2022-08-01 (×3): qty 20

## 2022-08-01 MED ORDER — VANCOMYCIN HCL 1250 MG/250ML IV SOLN: 1250.0000 mg | INTRAVENOUS | Status: DC

## 2022-08-01 MED ORDER — BISACODYL 5 MG PO TBEC: 5.0000 mg | DELAYED_RELEASE_TABLET | Freq: Every day | ORAL | Status: DC | PRN

## 2022-08-01 MED ORDER — ENOXAPARIN SODIUM 40 MG/0.4ML IJ SOSY
0.5000 mg/kg | PREFILLED_SYRINGE | INTRAMUSCULAR | Status: DC
  Administered 2022-08-01 – 2022-08-02 (×2): 32.5 mg via SUBCUTANEOUS
  Filled 2022-08-01 (×2): qty 0.4

## 2022-08-01 MED ORDER — ALBUTEROL SULFATE (2.5 MG/3ML) 0.083% IN NEBU: 2.5000 mg | INHALATION_SOLUTION | RESPIRATORY_TRACT | Status: DC | PRN

## 2022-08-01 MED ORDER — ONDANSETRON HCL 4 MG/2ML IJ SOLN
4.0000 mg | Freq: Four times a day (QID) | INTRAMUSCULAR | Status: DC | PRN
  Administered 2022-08-01 – 2022-08-03 (×3): 4 mg via INTRAVENOUS
  Filled 2022-08-01 (×3): qty 2

## 2022-08-01 MED ORDER — PREDNISOLONE ACETATE 1 % OP SUSP
2.0000 [drp] | Freq: Four times a day (QID) | OPHTHALMIC | Status: DC
  Administered 2022-08-01 (×2): 2 [drp] via OPHTHALMIC
  Filled 2022-08-01: qty 5

## 2022-08-01 MED ORDER — SENNOSIDES-DOCUSATE SODIUM 8.6-50 MG PO TABS: 1.0000 | ORAL_TABLET | Freq: Every evening | ORAL | Status: DC | PRN

## 2022-08-01 MED ORDER — ENOXAPARIN SODIUM 80 MG/0.8ML IJ SOSY
0.5000 mg/kg | PREFILLED_SYRINGE | INTRAMUSCULAR | Status: DC
  Filled 2022-08-01: qty 0.33

## 2022-08-01 MED ORDER — SERTRALINE HCL 50 MG PO TABS
50.0000 mg | ORAL_TABLET | Freq: Every day | ORAL | Status: DC
  Administered 2022-08-01 – 2022-08-17 (×17): 50 mg via ORAL
  Filled 2022-08-01 (×17): qty 1

## 2022-08-01 MED ORDER — SODIUM CHLORIDE 0.9 % IV SOLN
2.0000 g | INTRAVENOUS | Status: DC
  Administered 2022-08-01 – 2022-08-02 (×9): 2 g via INTRAVENOUS
  Filled 2022-08-01 (×11): qty 2000

## 2022-08-01 MED ORDER — DEXTROSE 5 % IV SOLN
425.0000 mg | Freq: Three times a day (TID) | INTRAVENOUS | Status: DC
  Administered 2022-08-01 – 2022-08-03 (×7): 425 mg via INTRAVENOUS
  Filled 2022-08-01 (×8): qty 8.5

## 2022-08-01 MED ORDER — PREDNISOLONE ACETATE 1 % OP SUSP
2.0000 [drp] | Freq: Two times a day (BID) | OPHTHALMIC | Status: DC
  Administered 2022-08-01 – 2022-08-17 (×31): 2 [drp] via OPHTHALMIC
  Filled 2022-08-01 (×2): qty 1
  Filled 2022-08-01: qty 5

## 2022-08-01 MED ORDER — SODIUM CHLORIDE 0.9 % IV SOLN: INTRAVENOUS | Status: DC

## 2022-08-01 MED ORDER — VANCOMYCIN HCL 500 MG/100ML IV SOLN
500.0000 mg | Freq: Two times a day (BID) | INTRAVENOUS | Status: DC
  Administered 2022-08-01 (×2): 500 mg via INTRAVENOUS
  Filled 2022-08-01 (×3): qty 100

## 2022-08-01 NOTE — Progress Notes (Signed)
Pharmacy Antibiotic Note  Sara Chapman is a 47 y.o. female admitted on 07/31/2022 with meningitis.  Pharmacy has been consulted for Acyclovir, Ampicillin, Vancomycin dosing.  Plan: Acyclovir 450 mg IV X 1 given in ED on 1/01 @ 2215. Acyclovir 425 mg (10 mg/kg IBW) IV Q8H ordered to start on 1/02 @ 0600.   Ampicillin 2 gm IV Q4H ordered to start on 01/02 @ 0100.   Vancomycin 1250 mg IV X 1 given on 1/1 @ 2341.  Vancomycin 500 mg IV Q12H ordered to start on 1/02 @ 1130.  Will use vanc dosing nomogram instead of AUC b/c of meningitis.   Height: 4\' 10"  (147.3 cm) Weight: 65.3 kg (144 lb) IBW/kg (Calculated) : 40.9  Temp (24hrs), Avg:98.7 F (37.1 C), Min:98.5 F (36.9 C), Max:98.9 F (37.2 C)  Recent Labs  Lab 07/30/22 1200 07/31/22 2057  WBC 5.0 6.1  CREATININE 1.08* 0.98  LATICACIDVEN  --  1.1    Estimated Creatinine Clearance: 56.2 mL/min (by C-G formula based on SCr of 0.98 mg/dL).    Allergies  Allergen Reactions   Clonazepam Other (See Comments)    SUICIDAL THOUGHTS     Ibuprofen Other (See Comments)    stomach ulcers    Nsaids Other (See Comments)    GI ulcers   Tolmetin Other (See Comments)    GI ulcers    Pseudoephedrine Hcl Palpitations and Other (See Comments)        Tape Rash    Adhesive tape breaks down the skin and causes sores, use paper tape    Antimicrobials this admission:   >>    >>   Dose adjustments this admission:   Microbiology results:  BCx:   UCx:    Sputum:    MRSA PCR:   Thank you for allowing pharmacy to be a part of this patient's care.  Daphne Karrer D 08/01/2022 2:18 AM

## 2022-08-01 NOTE — Consult Note (Signed)
Subjective: Pt reports several days of Head ache. Admitted presently for HZO, headache, possible meningitis. Pt reports an opening pressure of 35 on lumbar puncture. No change in HA since then. Pt c/o unable to open right eye. Pt wears rx readers which she doesn't have here.   Objective: Vital signs in last 24 hours: Temp:  [98.1 F (36.7 C)-98.9 F (37.2 C)] 98.6 F (37 C) (01/02 1138) Pulse Rate:  [66-99] 71 (01/02 1200) Resp:  [10-20] 10 (01/02 1200) BP: (118-144)/(87-99) 144/98 (01/02 1200) SpO2:  [96 %-98 %] 96 % (01/02 1200) Weight:  [65.3 kg] 65.3 kg (01/01 2054)    Objective exam :  Grossly there is a vesicular rash over the right side of the patient's scalp and upper face involving the lids.  Here upper and lower lids are edematous but non tender. With standard readers ( not her rx) NVA is J6 OD J 3 OS                                                         Pupils are miotic w/o APD                   IOP is normal by digital palpation                    Motility is normal Anterior segment: OS is normal                               OD reveals mild conj injection                                     Quiet cornea  (without pseudodendrites)                                       Quiet AC ( without iritis ) Posterior segment: The optic nerves are without papilledema, and are normal.   The retina is normal in both eyes.  Recent Labs    07/31/22 2057 08/01/22 0640 08/01/22 0749  WBC 6.1  --  5.5  HGB 14.8  --  13.8  HCT 43.7  --  40.9  NA 137 136  --   K 3.4* 4.2  --   CL 106 108  --   CO2 22 21*  --   BUN 9 8  --   CREATININE 0.98 0.87  --     Studies/Results: CT Orbits W Contrast  Result Date: 07/31/2022 CLINICAL DATA:  Right periorbital swelling EXAM: CT HEAD WITHOUT AND WITH CONTRAST CT ORBITS WITH CONTRAST TECHNIQUE: Multidetector CT imaging of the head was performed following the standard protocol before and after bolus injection of intravenous contrast.  Multidetector CT imaging of the orbits were performed following the standard protocol during bolus administration of intravenous contrast. RADIATION DOSE REDUCTION: This exam was performed according to the departmental dose-optimization program which includes automated exposure control, adjustment of the mA and/or kV according to patient size and/or use of iterative reconstruction technique. CONTRAST:  114mL OMNIPAQUE IOHEXOL 300 MG/ML  SOLN COMPARISON:  07/27/2022  FINDINGS: CT HEAD FINDINGS Brain: There is no mass, hemorrhage or extra-axial collection. Brain parenchyma and CSF spaces are normal. No abnormal enhancement. Incidentally noted partially empty sella. Vascular: No hyperdense vessel or unexpected calcification. Skull: Normal Other: None CT ORBITS FINDINGS Orbits: No traumatic or inflammatory finding. Globes, optic nerves, orbital fat, extraocular muscles, vascular structures, and lacrimal glands are normal. Visualized sinuses: Clear Soft tissues: Right periorbital soft tissue swelling has progressed since 12/28. No fluid collection. IMPRESSION: 1. Normal CT of the brain. 2. Right periorbital soft tissue swelling has progressed since 12/28. No fluid collection or intraorbital extension. Electronically Signed   By: Ulyses Jarred M.D.   On: 07/31/2022 22:28   CT Head W or Wo Contrast  Result Date: 07/31/2022 CLINICAL DATA:  Right periorbital swelling EXAM: CT HEAD WITHOUT AND WITH CONTRAST CT ORBITS WITH CONTRAST TECHNIQUE: Multidetector CT imaging of the head was performed following the standard protocol before and after bolus injection of intravenous contrast. Multidetector CT imaging of the orbits were performed following the standard protocol during bolus administration of intravenous contrast. RADIATION DOSE REDUCTION: This exam was performed according to the departmental dose-optimization program which includes automated exposure control, adjustment of the mA and/or kV according to patient size  and/or use of iterative reconstruction technique. CONTRAST:  169mL OMNIPAQUE IOHEXOL 300 MG/ML  SOLN COMPARISON:  07/27/2022 FINDINGS: CT HEAD FINDINGS Brain: There is no mass, hemorrhage or extra-axial collection. Brain parenchyma and CSF spaces are normal. No abnormal enhancement. Incidentally noted partially empty sella. Vascular: No hyperdense vessel or unexpected calcification. Skull: Normal Other: None CT ORBITS FINDINGS Orbits: No traumatic or inflammatory finding. Globes, optic nerves, orbital fat, extraocular muscles, vascular structures, and lacrimal glands are normal. Visualized sinuses: Clear Soft tissues: Right periorbital soft tissue swelling has progressed since 12/28. No fluid collection. IMPRESSION: 1. Normal CT of the brain. 2. Right periorbital soft tissue swelling has progressed since 12/28. No fluid collection or intraorbital extension. Electronically Signed   By: Ulyses Jarred M.D.   On: 07/31/2022 22:28   DG Chest Port 1 View  Result Date: 07/31/2022 CLINICAL DATA:  Possible sepsis EXAM: PORTABLE CHEST 1 VIEW COMPARISON:  12/01/2021 FINDINGS: The heart size and mediastinal contours are within normal limits. Both lungs are clear. The visualized skeletal structures are unremarkable. IMPRESSION: No active disease. Electronically Signed   By: Elmer Picker M.D.   On: 07/31/2022 21:50    Medications: I have reviewed the patient's current medications.  Assessment/Plan:  This patient has HZO blepharitis OD , and headache of uncertain etiology.  We did discuss neuropathic pain, possible late onset of iritis , and also possible neurotrophic corneal issues in the future. I recommended a 1 month follow up with me at the [facility].  I would continue the acyclovir IV here and maybe a oral course upon discharge so that the course is not less tham 10 days altogether. I would reduce the steroid eye drop to BID and have her continue it for a total course of 2 weeks.    LOS: 0 days   Birder Robson 1/2/20243:17 PM

## 2022-08-01 NOTE — Progress Notes (Signed)
No charge follow-up note, patient admitted early this morning with suspected herpes zoster ophthalmicus.  Seen and examined with her husband this morning in the ER room 1.  Brief exam: General: Patient looks tired, slightly uncomfortable, nontoxic and not in acute distress. Head: Patient has right periorbital and forehead swelling.  Periophthalmic redness and erythema noted, husband states it may be minimally improved. Neck: Supple with no JVD. Chest: Diminished breath sounds bilaterally.  No added sounds. Cardiovascular: S1-S2 with no murmur appreciated Extremities: Without any pedal edema. Abdomen soft nontender with no organomegaly CNS: Shows no obvious focal deficit.  47 year old female who is being managed for herpes zoster ophthalmicus, presented with headache, confusion and fever.  Concern for viral meningitis.  Now status post LP, continue empiric acyclovir, Rocephin, ampicillin, vancomycin.  Patient to be seen by ophthalmology today, will await further ophthalmology and neurology recommendations.

## 2022-08-01 NOTE — ED Notes (Signed)
Multiple attempts made to obtain labs from both IV and straight stick without success

## 2022-08-01 NOTE — ED Notes (Signed)
Pt placed on hospital bed for improved comfort. No additional needs voiced at this time

## 2022-08-01 NOTE — H&P (Signed)
History and Physical    Patient: Sara Chapman ZOX:[id number] DOB: 1976-01-31 DOA: 07/31/2022 DOS: the patient was seen and examined on 08/01/2022 PCP: Midge Minium, PA  Patient coming from: {Point_of_Origin:[address]}  Chief Complaint:  Chief Complaint  Patient presents with   Herpes Zoster   Code Sepsis   Cellulitis   Eye Problem   HPI: Marysol Wellnitz is a 47 y.o. female with medical history significant of ***  Review of Systems: {ROS_Text:26778} Past Medical History:  Diagnosis Date   Anemia    H/O   Anxiety    Bowel perforation (HCC)    Complication of anesthesia    WOKE UP DURING PLACEMENT OF JP DRAIN AND WAS IN SEVERE PAIN FEELING DRAIN PLACEMENT   GERD (gastroesophageal reflux disease)    Headache    MIGRAINES   History of kidney stones    X1   Hypertension    PONV (postoperative nausea and vomiting)    WITH C-SECTION   Psoriatic arthritis ([place])    Sleep apnea    CPAP   Stomach ulcer    Past Surgical History:  Procedure Laterality Date   CESAREAN SECTION     X2   COLON SURGERY     CYSTOSCOPY N/A 11/19/2019   Procedure: CYSTOSCOPY;  Surgeon: Hollice Espy, MD;  Location: [facility] ORS;  Service: Urology;  Laterality: N/A;   HIATAL HERNIA REPAIR N/A 01/23/2022   Procedure: HERNIA REPAIR HIATAL;  Surgeon: Clovis Riley, MD;  Location: [facility] ORS;  Service: General;  Laterality: N/A;   LAPAROSCOPIC GASTRIC SLEEVE RESECTION N/A 01/23/2022   Procedure: LAPAROSCOPIC SLEEVE GASTRECTOMY;  Surgeon: Clovis Riley, MD;  Location: [facility] OR[facility];  Service: General;  Laterality: N/A;   UPPER GI ENDOSCOPY N/A 01/23/2022   Procedure: UPPER GI ENDOSCOPY;  Surgeon: Clovis Riley, MD;  Location: [facility] ORS;  Service: General;  Laterality: N/A;   Social History:  reports that she has never smoked. She has never used smokeless tobacco. She reports current alcohol use. She reports that she does not use drugs.  Allergies  Allergen Reactions   Clonazepam Other (See  Comments)    SUICIDAL THOUGHTS     Ibuprofen Other (See Comments)    stomach ulcers    Nsaids Other (See Comments)    GI ulcers   Tolmetin Other (See Comments)    GI ulcers    Pseudoephedrine Hcl Palpitations and Other (See Comments)        Tape Rash    Adhesive tape breaks down the skin and causes sores, use paper tape    History reviewed. No pertinent family history.  Prior to Admission medications   Medication Sig Start Date End Date Taking? Authorizing Provider  amLODipine (NORVASC) 5 MG tablet Take 0.5 tablets (2.5 mg total) by mouth daily. 01/25/22 07/31/22 Yes Clovis Riley, MD  Ascorbic Acid (VITAMIN C PO) Take 1 tablet by mouth daily.   Yes [provider]  buPROPion (WELLBUTRIN XL) 150 MG 24 hr tablet Take 150 mg by mouth daily. 07/25/22 07/25/23 Yes [provider]  busPIRone (BUSPAR) 10 MG tablet Take 10 mg by mouth 2 (two) times daily.   Yes [provider]  calcium citrate (CALCITRATE - DOSED IN MG ELEMENTAL CALCIUM) 950 (200 Ca) MG tablet Take 500 mg by mouth 3 (three) times daily.   Yes [provider]  clindamycin (CLEOCIN) 300 MG capsule Take 300 mg by mouth 3 (three) times daily. 07/27/22  Yes [provider]  folic acid (  FOLVITE) 1 MG tablet Take 1 mg by mouth daily. 10/28/21 10/23/22 Yes [provider]  gabapentin (NEURONTIN) 100 MG capsule Take 2 capsules (200 mg total) by mouth every 12 (twelve) hours. 01/24/22  Yes Clovis Riley, MD  INFLIXIMAB IV Inject 1 Dose into the vein See admin instructions. Last dose was 01/03/22, pt should return for next infusion in 2 weeks, then 4 weeks, 5 weeks, 6 weeks, 7 weeks and 8 weeks   Yes [provider]  Multiple Vitamin (MULTIVITAMIN) tablet Take 1 tablet by mouth daily.   Yes [provider]  oxyCODONE-acetaminophen (PERCOCET) 5-325 MG tablet Take 1 tablet by mouth every 4 (four) hours as needed for up to 7 days for severe pain. 07/30/22 08/06/22 Yes  Arta Silence, MD  pantoprazole (PROTONIX) 40 MG tablet Take 1 tablet (40 mg total) by mouth daily. 01/24/22  Yes Clovis Riley, MD  prednisoLONE acetate (PREDNISOLONE ACETATE P-F) 1 % ophthalmic suspension Place 2 drops into the right eye in the morning and at bedtime for 7 days. 07/30/22 08/06/22 Yes Arta Silence, MD  sertraline (ZOLOFT) 50 MG tablet Take 50 mg by mouth daily.   Yes [provider]  traZODone (DESYREL) 50 MG tablet Take 50 mg by mouth at bedtime.   Yes [provider]  valACYclovir (VALTREX) 1000 MG tablet Take 1,000 mg by mouth 3 (three) times daily. 07/26/22 08/02/22 Yes [provider]  VITAMIN D PO Take 1 tablet by mouth daily.   Yes [provider]  Cyanocobalamin (B-12 PO) Take 1 tablet by mouth daily. Patient not taking: Reported on 07/31/2022    [provider]  MAGNESIUM PO Take 3 capsules by mouth daily. Patient not taking: Reported on 07/31/2022    [provider]  ondansetron (ZOFRAN-ODT) 4 MG disintegrating tablet Dissolve 1 tablet (4 mg total) by mouth every 6 (six) hours as needed for nausea or vomiting. Patient not taking: Reported on 07/31/2022 01/24/22   Clovis Riley, MD  topiramate (TOPAMAX) 25 MG tablet Take 25 mg by mouth 2 (two) times daily. Patient not taking: Reported on 07/31/2022 10/28/20   [provider]  traMADol (ULTRAM) 50 MG tablet Take 1 tablet (50 mg total) by mouth every 6 (six) hours as needed (pain). Patient not taking: Reported on 07/31/2022 01/24/22   Clovis Riley, MD    Physical Exam: Vitals:   07/31/22 2054 07/31/22 2230 07/31/22 2308  BP: (!) 134/98 (!) 135/97 (!) 141/99  Pulse: 99 73 81  Resp: 16 12 14   Temp: 98.9 F (37.2 C)  98.5 F (36.9 C)  TempSrc: Oral  Oral  SpO2: 98% 97% 96%  Weight: 65.3 kg    Height: 4\' 10"  (1.473 m)     *** Data Reviewed: {Tip this will not be part of the note when signed- Document your independent interpretation of  telemetry tracing, EKG, lab, Radiology test or any other diagnostic tests. Add any new diagnostic test ordered today. (Optional):26781} {Results:26384}  Assessment and Plan: No notes have been filed under this hospital service. Service: Hospitalist     Advance Care Planning:   Code Status: Full Code ***  Consults: ***  Family Communication: ***  Severity of Illness: {Observation/Inpatient:21159}  Author: Artist Beach, MD 08/01/2022 12:02 AM  For on call review [email or web address].

## 2022-08-01 NOTE — Progress Notes (Signed)
Anticoagulation monitoring(Lovenox):  47 yo female ordered Lovenox 40 mg Q24h    Filed Weights   07/31/22 2054  Weight: 65.3 kg (144 lb)   BMI 30.1   Lab Results  Component Value Date   CREATININE 0.98 07/31/2022   CREATININE 1.08 (H) 07/30/2022   CREATININE 1.15 (H) 01/24/2022   Estimated Creatinine Clearance: 56.2 mL/min (by C-G formula based on SCr of 0.98 mg/dL). Hemoglobin & Hematocrit     Component Value Date/Time   HGB 14.8 07/31/2022 2057   HGB 10.9 (L) 04/21/2013 1027   HCT 43.7 07/31/2022 2057   HCT 32.0 (L) 04/21/2013 1027     Per Protocol for Patient with estCrcl > 30 ml/min and BMI > 30, will transition to Lovenox 35 mg Q24h.

## 2022-08-02 ENCOUNTER — Inpatient Hospital Stay: Payer: BC Managed Care – PPO

## 2022-08-02 ENCOUNTER — Other Ambulatory Visit: Payer: Self-pay

## 2022-08-02 DIAGNOSIS — B0230 Zoster ocular disease, unspecified: Secondary | ICD-10-CM | POA: Diagnosis not present

## 2022-08-02 DIAGNOSIS — A879 Viral meningitis, unspecified: Secondary | ICD-10-CM | POA: Diagnosis not present

## 2022-08-02 LAB — BASIC METABOLIC PANEL
Anion gap: 6 (ref 5–15)
BUN: 7 mg/dL (ref 6–20)
CO2: 23 mmol/L (ref 22–32)
Calcium: 7.6 mg/dL — ABNORMAL LOW (ref 8.9–10.3)
Chloride: 109 mmol/L (ref 98–111)
Creatinine, Ser: 0.91 mg/dL (ref 0.44–1.00)
GFR, Estimated: >60 mL/min (ref >60)
Glucose, Bld: 85 mg/dL (ref 70–99)
Potassium: 3.3 mmol/L — ABNORMAL LOW (ref 3.5–5.1)
Sodium: 138 mmol/L (ref 135–145)

## 2022-08-02 LAB — CBC
HCT: 36.2 % (ref 36.0–46.0)
Hemoglobin: 12.2 g/dL (ref 12.0–15.0)
MCH: 31.9 pg (ref 26.0–34.0)
MCHC: 33.7 g/dL (ref 30.0–36.0)
MCV: 94.5 fL (ref 80.0–100.0)
Platelets: 174 10*3/uL (ref 150–400)
RBC: 3.83 MIL/uL — ABNORMAL LOW (ref 3.87–5.11)
RDW: 12.4 % (ref 11.5–15.5)
WBC: 7.7 10*3/uL (ref 4.0–10.5)
nRBC: 0 % (ref 0.0–0.2)

## 2022-08-02 LAB — URINE CULTURE: Culture: <10000 — AB

## 2022-08-02 MED ORDER — BUTALBITAL-APAP-CAFFEINE 50-325-40 MG PO TABS
2.0000 | ORAL_TABLET | ORAL | Status: DC | PRN
  Administered 2022-08-02: 2 via ORAL
  Filled 2022-08-02: qty 2

## 2022-08-02 MED ORDER — ENOXAPARIN SODIUM 40 MG/0.4ML IJ SOSY
40.0000 mg | PREFILLED_SYRINGE | INTRAMUSCULAR | Status: DC
  Administered 2022-08-02 – 2022-08-16 (×15): 40 mg via SUBCUTANEOUS
  Filled 2022-08-02 (×15): qty 0.4

## 2022-08-02 MED ORDER — POTASSIUM CHLORIDE CRYS ER 20 MEQ PO TBCR
40.0000 meq | EXTENDED_RELEASE_TABLET | Freq: Once | ORAL | Status: AC
  Administered 2022-08-02: 40 meq via ORAL
  Filled 2022-08-02: qty 2

## 2022-08-02 MED ORDER — GADOBUTROL 1 MMOL/ML IV SOLN
6.0000 mL | Freq: Once | INTRAVENOUS | Status: AC | PRN
  Administered 2022-08-02: 6 mL via INTRAVENOUS

## 2022-08-02 MED ORDER — PREGABALIN 75 MG PO CAPS
75.0000 mg | ORAL_CAPSULE | Freq: Two times a day (BID) | ORAL | Status: DC
  Administered 2022-08-02 – 2022-08-13 (×23): 75 mg via ORAL
  Filled 2022-08-02 (×23): qty 1

## 2022-08-02 NOTE — ED Notes (Signed)
Pt given a breakfast tray.

## 2022-08-02 NOTE — Progress Notes (Addendum)
  Progress Note   Patient: Sara Chapman GDJ:[id number] DOB: [birth year] DOA: 07/31/2022     1 DOS: the patient was seen and examined on 08/02/2022   Brief hospital course: Sara Chapman is a 47 y.o. female with medical history significant of hypertension, migraine headaches, psoriatic arthritis on infliximab, anxiety and depression. She was admitted 07/31/22 with right facial shingles involving the right side of the face, forehead and R eye. She had some associated fever and confusion and so was admitted with empiric abx after LP. She has been seen by ophthalmology in house and is awaiting neurology evaluation as well.  Assessment and Plan: 47 year old female who is being managed for herpes zoster ophthalmicus.  Presents with headache, confusion and fever.  Symptoms concerning for viral meningitis.  Status post LP with CSF results pending.  Patient has been initiated on empiric treatment.   Herpes zoster ophthalmicus: Failed outpatient treatment on oral antiviral regimen.  Mild visual impairment, limited exam due to right periorbital swelling.  Discussed with ophthalmology who has seen in house on 1/2, continue with steroid eyedrops for treatment of possible viral iritis for 2 weeks as well as systemic antivirals for total 10 days.  Ophthalmology would like to follow up in their office in about one month.   Suspected viral meningitis: Patient is status post lumbar puncture.  Patient was initiated on empiric antibiotics (which I will DC today since studies neg for bacterial meningitis) and antiviral treatment.  Will monitor for improvement with symptoms. Optimize pain management. Neurology evaluation has been requested.   Severe headache: Likely secondary to viral meningitis: Patient has history of underlying migraine headaches.  Will trial Fioricet today.   Hypertension: Patient is on amlodipine at home.   Anxiety/depression: Patient is on trazodone, BuSpar and sertraline.  Will order EKG      Advance Care Planning:   Code Status: Full Code    Consults: Neurology and ophthalmology consults.   Family Communication: No family at bedside this AM, discussed with patient and husband at bedside on 1/2.  Subjective: Seen and examined in ER. Doing well, still has swelling of face and severe headache. No acute events overnight.  Physical Exam: Vitals:   08/02/22 0255 08/02/22 0400 08/02/22 0600 08/02/22 0756  BP: 128/82 (!) 121/90 (!) 141/96   Pulse: 76 76 73   Resp: 11 10 14    Temp: 98.1 F (36.7 C) 98 F (36.7 C)  97.8 F (36.6 C)  TempSrc: Oral   Oral  SpO2: 97% 94% 97%   Weight:      Height:      General: Patient looks tired, slightly uncomfortable, nontoxic and not in acute distress. Head: Patient has right periorbital and forehead swelling.  Dry vesicular rash noted. Neck: Supple with no JVD. Chest: Diminished breath sounds bilaterally.  No added sounds. Cardiovascular: S1-S2 with no murmur appreciated Extremities: Without any pedal edema. Abdomen soft nontender with no organomegaly CNS: Shows no obvious focal deficit.  Data Reviewed: CBC, BMP, CSF studies.  Time spent: 37 minutes  Author: Neytiri Asche Marry Guan, MD 08/02/2022 8:17 AM  For on call review [email or web address].

## 2022-08-02 NOTE — ED Notes (Signed)
MD Hollice Gong at bedside

## 2022-08-03 DIAGNOSIS — A879 Viral meningitis, unspecified: Secondary | ICD-10-CM | POA: Diagnosis not present

## 2022-08-03 LAB — BASIC METABOLIC PANEL
Anion gap: 7 (ref 5–15)
BUN: <5 mg/dL — ABNORMAL LOW (ref 6–20)
CO2: 25 mmol/L (ref 22–32)
Calcium: 7.9 mg/dL — ABNORMAL LOW (ref 8.9–10.3)
Chloride: 106 mmol/L (ref 98–111)
Creatinine, Ser: 1.06 mg/dL — ABNORMAL HIGH (ref 0.44–1.00)
GFR, Estimated: >60 mL/min (ref >60)
Glucose, Bld: 76 mg/dL (ref 70–99)
Potassium: 3.3 mmol/L — ABNORMAL LOW (ref 3.5–5.1)
Sodium: 138 mmol/L (ref 135–145)

## 2022-08-03 LAB — CBC
HCT: 37.2 % (ref 36.0–46.0)
Hemoglobin: 12.9 g/dL (ref 12.0–15.0)
MCH: 32 pg (ref 26.0–34.0)
MCHC: 34.7 g/dL (ref 30.0–36.0)
MCV: 92.3 fL (ref 80.0–100.0)
Platelets: 191 10*3/uL (ref 150–400)
RBC: 4.03 MIL/uL (ref 3.87–5.11)
RDW: 12.5 % (ref 11.5–15.5)
WBC: 5.6 10*3/uL (ref 4.0–10.5)
nRBC: 0 % (ref 0.0–0.2)

## 2022-08-03 LAB — VZV PCR, CSF: VZV PCR, CSF: POSITIVE — AB

## 2022-08-03 LAB — HIV ANTIBODY (ROUTINE TESTING W REFLEX): HIV Screen 4th Generation wRfx: NONREACTIVE

## 2022-08-03 LAB — HSV 1/2 PCR, CSF
HSV-1 DNA: NEGATIVE
HSV-2 DNA: NEGATIVE

## 2022-08-03 MED ORDER — DEXTROSE 5 % IV SOLN
10.0000 mg/kg | Freq: Three times a day (TID) | INTRAVENOUS | Status: AC
  Administered 2022-08-03 – 2022-08-17 (×42): 530 mg via INTRAVENOUS
  Filled 2022-08-03: qty 1.6
  Filled 2022-08-03 (×43): qty 10.6

## 2022-08-03 MED ORDER — POTASSIUM CHLORIDE CRYS ER 20 MEQ PO TBCR
40.0000 meq | EXTENDED_RELEASE_TABLET | Freq: Once | ORAL | Status: AC
  Administered 2022-08-03: 40 meq via ORAL
  Filled 2022-08-03: qty 2

## 2022-08-03 NOTE — Progress Notes (Signed)
  Progress Note   Patient: Sara Chapman MPN:[id number] DOB: 08/25/1975 DOA: 07/31/2022     2 DOS: the patient was seen and examined on 08/03/2022   Brief hospital course: Sara Chapman is a 47 y.o. female with medical history significant of hypertension, migraine headaches, psoriatic arthritis on infliximab, anxiety and depression. She was admitted 07/31/22 with right facial shingles involving the right side of the face, forehead and R eye. She had some associated fever and confusion and so was admitted with empiric abx after LP. She has been seen by ophthalmology in house and is awaiting neurology evaluation as well.  Assessment and Plan: 47 year old female who is being managed for herpes zoster ophthalmicus.  Presents with headache, confusion and fever.  Symptoms concerning for viral meningitis.  Status post LP with CSF results pending.  Patient has been initiated on empiric treatment.   Herpes zoster ophthalmicus: Failed outpatient treatment on oral antiviral regimen.  Mild visual impairment, limited exam due to right periorbital swelling.  Discussed with ophthalmology who has seen in house on 1/2, continue with steroid eyedrops for treatment of possible viral iritis for 2 weeks as well as systemic antivirals for total 10 days.  Ophthalmology would like to follow up in their office in about one month.   Suspected viral meningitis: Patient is status post lumbar puncture.  Patient was initiated on empiric antibiotics (which have been DC 1/3 since studies neg for bacterial meningitis) and antiviral treatment.  Will monitor for improvement with symptoms. Optimize pain management. Neurology evaluation has been requested.   Severe headache: Likely secondary to viral meningitis: Patient has history of underlying migraine headaches.  Neurology has started her on Lyrica but headache still the same.   Hypertension: Patient is on amlodipine at home.   Anxiety/depression: Patient is on trazodone,  BuSpar and sertraline.  Will order EKG     Advance Care Planning:   Code Status: Full Code    Consults: Neurology and ophthalmology consults.   Family Communication:  Discussed with patient and husband at bedside today.  Subjective:  Seen and examined in her room on 2A.  Doing well, still has swelling of face and severe headache. No acute events overnight.  Physical Exam: Vitals:   08/02/22 2017 08/03/22 0049 08/03/22 0419 08/03/22 0802  BP: (!) 151/93 124/79 139/86 132/82  Pulse: 75 64 65 64  Resp: 12  15 18   Temp: 98.3 F (36.8 C) 97.9 F (36.6 C) 98.3 F (36.8 C) 98 F (36.7 C)  TempSrc: Oral Oral Oral   SpO2: 98% 95% 95% 96%  Weight:      Height:      General: Patient looks tired, slightly uncomfortable, nontoxic and not in acute distress. Head: Patient has right periorbital and forehead swelling but sig better today.  Dry vesicular rash noted. Neck: Supple with no JVD. Chest: Diminished breath sounds bilaterally.  No added sounds. Cardiovascular: S1-S2 with no murmur appreciated Extremities: Without any pedal edema. Abdomen soft nontender with no organomegaly CNS: Shows no obvious focal deficit.  Data Reviewed: CBC, BMP, CSF studies including VZV are pending  MR Brain and Orbits 1/3: 1. Right periorbital soft tissue thickening and hyperenhancement, which appears decreased compared to the 07/31/2022 CT when accounting for differences in technique. 2. No acute intracranial process. 3. No traumatic or inflammatory finding in the orbits.  Time spent: 27 minutes  Author: Bryson Gavia Marry Guan, MD 08/03/2022 10:57 AM  For on call review [email or web address].

## 2022-08-03 NOTE — Progress Notes (Signed)
Mobility Specialist - Progress Note   08/03/22 1436  Mobility  Activity Refused mobility  $Mobility charge 1 Mobility   Pt defers mobility this date, no reason specified. Will attempt again at another date and time.   Gretchen Short  Mobility Specialist  08/03/22 2:37 PM

## 2022-08-03 NOTE — Consult Note (Signed)
NEUROLOGY CONSULTATION NOTE   Date of service: August 03, 2022 Patient Name: Sara Chapman MRN:  [id number] DOB:  01-Sep-1975 Reason for consult: HZO, possible meningitis Requesting physician: Dr. Colon Branch _ _ _   _ __   _ __ _ _  __ __   _ __   __ _  History of Present Illness   This is a 47 yo woman with hx HTN, migraines who was admitted with herpes zoster opthalmicus HZO and headache, confusion, and fever concerning for meningitis. She failed outpatient oral antivirals for HZO. Ophtho saw her in house on 1/2 and recommended systemic antivirals for 10 days + steroid eyedrops for possible viral iritis. LP showed tube 1 RBC 17 WBC 5, tube 4 RBC 134 WBC 0, protein 25, glucose 55. HSV and VZV PCR are pending on CSF.    ROS   Per HPI: all other systems reviewed and are negative  Past History   I have reviewed the following:  Past Medical History:  Diagnosis Date   Anemia    H/O   Anxiety    Bowel perforation (HCC)    Complication of anesthesia    WOKE UP DURING PLACEMENT OF JP DRAIN AND WAS IN SEVERE PAIN FEELING DRAIN PLACEMENT   GERD (gastroesophageal reflux disease)    Headache    MIGRAINES   History of kidney stones    X1   Hypertension    PONV (postoperative nausea and vomiting)    WITH C-SECTION   Psoriatic arthritis (HCC)    Sleep apnea    CPAP   Stomach ulcer    Past Surgical History:  Procedure Laterality Date   CESAREAN SECTION     X2   COLON SURGERY     CYSTOSCOPY N/A 11/19/2019   Procedure: CYSTOSCOPY;  Surgeon: [name], MD;  Location: [facility];  Service: Urology;  Laterality: N/A;   HIATAL HERNIA REPAIR N/A 01/23/2022   Procedure: HERNIA REPAIR HIATAL;  Surgeon: Berna Bue, MD;  Location: [facility];  Service: General;  Laterality: N/A;   LAPAROSCOPIC GASTRIC SLEEVE RESECTION N/A 01/23/2022   Procedure: LAPAROSCOPIC SLEEVE GASTRECTOMY;  Surgeon: Berna Bue, MD;  Location: [facility];  Service: General;  Laterality: N/A;    UPPER GI ENDOSCOPY N/A 01/23/2022   Procedure: UPPER GI ENDOSCOPY;  Surgeon: Berna Bue, MD;  Location: [facility];  Service: General;  Laterality: N/A;   History reviewed. No pertinent family history. Social History   Socioeconomic History   Marital status: Married    Spouse name: Not on file   Number of children: Not on file   Years of education: Not on file   Highest education level: Not on file  Occupational History   Not on file  Tobacco Use   Smoking status: Never   Smokeless tobacco: Never  Vaping Use   Vaping Use: Never used  Substance and Sexual Activity   Alcohol use: Yes    Comment: very rare   Drug use: Never   Sexual activity: Not on file  Other Topics Concern   Not on file  Social History Narrative   Not on file   Social Determinants of Health   Financial Resource Strain: Not on file  Food Insecurity: No Food Insecurity (08/02/2022)   Hunger Vital Sign    Worried About Running Out of Food in the Last Year: Never true    Ran Out of Food in the Last Year: Never true  Transportation Needs: No Transportation Needs (  08/02/2022)   PRAPARE - Administrator, Civil Service (Medical): No    Lack of Transportation (Non-Medical): No  Physical Activity: Not on file  Stress: Not on file  Social Connections: Not on file   Allergies  Allergen Reactions   Clonazepam Other (See Comments)    SUICIDAL THOUGHTS     Ibuprofen Other (See Comments)    stomach ulcers    Nsaids Other (See Comments)    GI ulcers   Tolmetin Other (See Comments)    GI ulcers    Pseudoephedrine Hcl Palpitations and Other (See Comments)        Tape Rash    Adhesive tape breaks down the skin and causes sores, use paper tape    Medications   Medications Prior to Admission  Medication Sig Dispense Refill Last Dose   amLODipine (NORVASC) 5 MG tablet Take 0.5 tablets (2.5 mg total) by mouth daily. 30 tablet 0 07/31/2022   Ascorbic Acid (VITAMIN C PO) Take 1 tablet by mouth  daily.   07/31/2022   buPROPion (WELLBUTRIN XL) 150 MG 24 hr tablet Take 150 mg by mouth daily.   07/31/2022   busPIRone (BUSPAR) 10 MG tablet Take 10 mg by mouth 2 (two) times daily.   07/31/2022   calcium citrate (CALCITRATE - DOSED IN MG ELEMENTAL CALCIUM) 950 (200 Ca) MG tablet Take 500 mg by mouth 3 (three) times daily.   07/31/2022   clindamycin (CLEOCIN) 300 MG capsule Take 300 mg by mouth 3 (three) times daily.   07/31/2022   folic acid (FOLVITE) 1 MG tablet Take 1 mg by mouth daily.   07/31/2022   gabapentin (NEURONTIN) 100 MG capsule Take 2 capsules (200 mg total) by mouth every 12 (twelve) hours. 20 capsule 0 Past Week   INFLIXIMAB IV Inject 1 Dose into the vein See admin instructions. Last dose was 01/03/22, pt should return for next infusion in 2 weeks, then 4 weeks, 5 weeks, 6 weeks, 7 weeks and 8 weeks      Multiple Vitamin (MULTIVITAMIN) tablet Take 1 tablet by mouth daily.   07/31/2022   oxyCODONE-acetaminophen (PERCOCET) 5-325 MG tablet Take 1 tablet by mouth every 4 (four) hours as needed for up to 7 days for severe pain. 20 tablet 0 07/31/2022   pantoprazole (PROTONIX) 40 MG tablet Take 1 tablet (40 mg total) by mouth daily. 90 tablet 0 07/31/2022   prednisoLONE acetate (PREDNISOLONE ACETATE P-F) 1 % ophthalmic suspension Place 2 drops into the right eye in the morning and at bedtime for 7 days. 5 mL 0 07/31/2022   sertraline (ZOLOFT) 50 MG tablet Take 50 mg by mouth daily.   Past Week   traZODone (DESYREL) 50 MG tablet Take 50 mg by mouth at bedtime.   07/31/2022   [EXPIRED] valACYclovir (VALTREX) 1000 MG tablet Take 1,000 mg by mouth 3 (three) times daily.   07/31/2022   VITAMIN D PO Take 1 tablet by mouth daily.   07/31/2022   Cyanocobalamin (B-12 PO) Take 1 tablet by mouth daily. (Patient not taking: Reported on 07/31/2022)   Not Taking   MAGNESIUM PO Take 3 capsules by mouth daily. (Patient not taking: Reported on 07/31/2022)   Not Taking   ondansetron (ZOFRAN-ODT) 4 MG disintegrating tablet Dissolve 1  tablet (4 mg total) by mouth every 6 (six) hours as needed for nausea or vomiting. (Patient not taking: Reported on 07/31/2022) 20 tablet 0 Not Taking   topiramate (TOPAMAX) 25 MG tablet Take 25 mg  by mouth 2 (two) times daily. (Patient not taking: Reported on 07/31/2022)   Not Taking   traMADol (ULTRAM) 50 MG tablet Take 1 tablet (50 mg total) by mouth every 6 (six) hours as needed (pain). (Patient not taking: Reported on 07/31/2022) 10 tablet 0 Not Taking      Current Facility-Administered Medications:    0.9 %  sodium chloride infusion, , Intravenous, Continuous, Acheampong, Warnell Bureau, MD, Stopped at 08/02/22 1813   acyclovir (ZOVIRAX) 425 mg in dextrose 5 % 100 mL IVPB, 425 mg, Intravenous, Q8H, Acheampong, Warnell Bureau, MD, Last Rate: 108.5 mL/hr at 08/03/22 0638, 425 mg at 08/03/22 0638   albuterol (PROVENTIL) (2.5 MG/3ML) 0.083% nebulizer solution 2.5 mg, 2.5 mg, Nebulization, Q2H PRN, Acheampong, Warnell Bureau, MD   bisacodyl (DULCOLAX) EC tablet 5 mg, 5 mg, Oral, Daily PRN, Acheampong, Warnell Bureau, MD   buPROPion (WELLBUTRIN XL) 24 hr tablet 150 mg, 150 mg, Oral, Daily, Acheampong, Warnell Bureau, MD, 150 mg at 08/02/22 0923   busPIRone (BUSPAR) tablet 10 mg, 10 mg, Oral, BID, Acheampong, Warnell Bureau, MD, 10 mg at 08/02/22 2141   enoxaparin (LOVENOX) injection 40 mg, 40 mg, Subcutaneous, Q24H, Benita Gutter, RPH, 40 mg at 73/53/29 9242   folic acid (FOLVITE) tablet 1 mg, 1 mg, Oral, Daily, Acheampong, Warnell Bureau, MD, 1 mg at 08/02/22 6834   HYDROmorphone (DILAUDID) injection 0.5-1 mg, 0.5-1 mg, Intravenous, Q4H PRN, Acheampong, Warnell Bureau, MD, 1 mg at 08/03/22 1962   ondansetron [facility]) injection 4 mg, 4 mg, Intravenous, Q6H PRN, Hollice Gong, Mir Mohammed, MD, 4 mg at 08/01/22 2057   ondansetron (ZOFRAN-ODT) disintegrating tablet 4 mg, 4 mg, Oral, Q6H PRN, Acheampong, Warnell Bureau, MD, 4 mg at 08/01/22 0146   oxyCODONE-acetaminophen (PERCOCET/ROXICET) 5-325 MG per tablet 1 tablet, 1 tablet, Oral, Q4H PRN, Acheampong, Warnell Bureau, MD,  1 tablet at 08/03/22 0424   pantoprazole (PROTONIX) EC tablet 40 mg, 40 mg, Oral, Daily, Acheampong, Warnell Bureau, MD, 40 mg at 08/02/22 2297   prednisoLONE acetate (PRED FORTE) 1 % ophthalmic suspension 2 drop, 2 drop, Right Eye, BID, Hollice Gong, Mir Mohammed, MD, 2 drop at 08/02/22 0928   pregabalin (LYRICA) capsule 75 mg, 75 mg, Oral, BID, Derek Jack, MD, 75 mg at 08/02/22 2141   senna-docusate (Senokot-S) tablet 1 tablet, 1 tablet, Oral, QHS PRN, Acheampong, Warnell Bureau, MD   sertraline (ZOLOFT) tablet 50 mg, 50 mg, Oral, Daily, Acheampong, Warnell Bureau, MD, 50 mg at 08/02/22 0925  Vitals   Vitals:   08/02/22 1702 08/02/22 2017 08/03/22 0049 08/03/22 0419  BP:  (!) 151/93 124/79 139/86  Pulse:  75 64 65  Resp:  12  15  Temp: 98.3 F (36.8 C) 98.3 F (36.8 C) 97.9 F (36.6 C) 98.3 F (36.8 C)  TempSrc: Oral Oral Oral Oral  SpO2:  98% 95% 95%  Weight:      Height:         Body mass index is 30.1 kg/m.  Physical Exam   Physical Exam Gen: A&O x4, NAD HEENT: Atraumatic, normocephalic;mucous membranes moist; oropharynx clear, tongue without atrophy or fasciculations. Shingles in V1 distribution on R, R periorbital swelling, R conjunctival injection. Neck: Supple, trachea midline. Resp: CTAB, no w/r/r CV: RRR, no m/g/r; nml S1 and S2. 2+ symmetric peripheral pulses. Abd: soft/NT/ND; nabs x 4 quad Extrem: Nml bulk; no cyanosis, clubbing, or edema.  Neuro: *MS: A&O x4, NAD *Speech: fluid, nondysarthric, able to name and repeat *CN:    I: Deferred  II,III: PERRLA, VFF by confrontation, optic discs unable to be visualized 2/2 pupillary constriction   III,IV,VI: EOMI w/o nystagmus, no ptosis   V: Sensation intact from V1 to V3 to LT   VII: Eyelid closure was full.  Smile symmetric.   VIII: Hearing intact to voice   IX,X: Voice normal, palate elevates symmetrically    XI: SCM/trap 5/5 bilat   XII: Tongue protrudes midline, no atrophy or fasciculations   *Motor:   Normal bulk.  No  tremor, rigidity or bradykinesia. No pronator drift.    Strength: Dlt Bic Tri WrE WrF FgS Gr HF KnF KnE PlF DoF    Left 5 5 5 5 5 5 5 5 5 5 5 5     Right 5 5 5 5 5 5 5 5 5 5 5 5     *Sensory: Intact to light touch, pinprick, temperature vibration throughout. Symmetric. Propioception intact bilat.  No double-simultaneous extinction.  *Coordination:  Finger-to-nose, heel-to-shin, rapid alternating motions were intact. *Reflexes:  2+ and symmetric throughout without clonus; toes down-going bilat *Gait: deferred   Labs   CBC:  Recent Labs  Lab 07/30/22 1200 07/31/22 2057 08/01/22 0749 08/02/22 0452  WBC 5.0 6.1 5.5 7.7  NEUTROABS 3.0 3.8  --   --   HGB 14.9 14.8 13.8 12.2  HCT 44.8 43.7 40.9 36.2  MCV 93.5 93.2 95.6 94.5  PLT 213 208 188 376    Basic Metabolic Panel:  Lab Results  Component Value Date   NA 138 08/02/2022   K 3.3 (L) 08/02/2022   CO2 23 08/02/2022   GLUCOSE 85 08/02/2022   BUN 7 08/02/2022   CREATININE 0.91 08/02/2022   CALCIUM 7.6 (L) 08/02/2022   GFRNONAA >60 08/02/2022   GFRAA >60 11/24/2019   Lipid Panel: No results found for: "LDLCALC" HgbA1c:  Lab Results  Component Value Date   HGBA1C 4.7 (L) 11/17/2019   Urine Drug Screen: No results found for: "LABOPIA", "COCAINSCRNUR", "LABBENZ", "AMPHETMU", "THCU", "LABBARB"  Alcohol Level No results found for: "ETH"   Impression   This is a 47 yo woman with hx HTN, migraines who was admitted with herpes zoster opthalmicus HZO and headache, confusion, and fever concerning for meningitis.   Recommendations   - Continue acyclovir CNS dosing - F/u VZV, HSV PCR on CSF - D/c fioricet - Start lyrica 75mg  bid - Dilaudid, percocet prn for pain - MRI brain and orbits wwo contrast - Will continue to follow ______________________________________________________________________   Thank you for the opportunity to take part in the care of this patient. If you have any further questions, please contact the  neurology consultation attending.  Signed,  Su Monks, MD [facility] [phone]  If 7pm- 7am, please page neurology on call as listed in [place].  **Any copied and pasted documentation in this note was written by me in another application not billed for and pasted by me into this document.

## 2022-08-04 DIAGNOSIS — A879 Viral meningitis, unspecified: Secondary | ICD-10-CM | POA: Diagnosis not present

## 2022-08-04 LAB — BASIC METABOLIC PANEL
Anion gap: 4 — ABNORMAL LOW (ref 5–15)
BUN: 6 mg/dL (ref 6–20)
CO2: 24 mmol/L (ref 22–32)
Calcium: 7.9 mg/dL — ABNORMAL LOW (ref 8.9–10.3)
Chloride: 109 mmol/L (ref 98–111)
Creatinine, Ser: 1.1 mg/dL — ABNORMAL HIGH (ref 0.44–1.00)
GFR, Estimated: >60 mL/min (ref >60)
Glucose, Bld: 84 mg/dL (ref 70–99)
Potassium: 3.4 mmol/L — ABNORMAL LOW (ref 3.5–5.1)
Sodium: 137 mmol/L (ref 135–145)

## 2022-08-04 LAB — CBC
HCT: 36.8 % (ref 36.0–46.0)
Hemoglobin: 12.5 g/dL (ref 12.0–15.0)
MCH: 31.5 pg (ref 26.0–34.0)
MCHC: 34 g/dL (ref 30.0–36.0)
MCV: 92.7 fL (ref 80.0–100.0)
Platelets: 210 10*3/uL (ref 150–400)
RBC: 3.97 MIL/uL (ref 3.87–5.11)
RDW: 12.5 % (ref 11.5–15.5)
WBC: 6.1 10*3/uL (ref 4.0–10.5)
nRBC: 0 % (ref 0.0–0.2)

## 2022-08-04 LAB — CSF CULTURE W GRAM STAIN
Culture: NO GROWTH
Gram Stain: NONE SEEN

## 2022-08-04 NOTE — Progress Notes (Addendum)
Pharmacy Antibiotic Note  Sara Chapman is a 47 y.o. 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  Pharmacy has been consulted for Acyclovir dosing.  Varicella Zoster Virus DNA CSF detected  HSV 1/2 PCR CSF negative.    Plan: Day 4 of acyclovir. Acyclovir was dosed using IBW, which isn't wrong. Per [place] policy acyclovir dosing recommended using TBW or AdjBW if TBW >140 IBW. Acyclovir dose adjusted using AdjBW 10 mg/kg TID.    Height: 4\' 10"  (147.3 cm) Weight: 65.3 kg (144 lb) IBW/kg (Calculated) : 40.9  Temp (24hrs), Avg:98.2 F (36.8 C), Min:98 F (36.7 C), Max:98.7 F (37.1 C)  Recent Labs  Lab 07/31/22 2057 08/01/22 0640 08/01/22 0749 08/02/22 0452 08/03/22 0621 08/04/22 0716  WBC 6.1  --  5.5 7.7 5.6 6.1  CREATININE 0.98 0.87  --  0.91 1.06* 1.10*  LATICACIDVEN 1.1  --   --   --   --   --      Estimated Creatinine Clearance: 50.1 mL/min (A) (by C-G formula based on SCr of 1.1 mg/dL (H)).    Allergies  Allergen Reactions   Clonazepam Other (See Comments)    SUICIDAL THOUGHTS     Ibuprofen Other (See Comments)    stomach ulcers    Nsaids Other (See Comments)    GI ulcers   Tolmetin Other (See Comments)    GI ulcers    Pseudoephedrine Hcl Palpitations and Other (See Comments)        Tape Rash    Adhesive tape breaks down the skin and causes sores, use paper tape     Microbiology results: 1/1 BCx: pending 1/1 CSF Cx: pending  Thank you for allowing pharmacy to be a part of this patient's care.  Oswald Hillock, PharmD, BCPS 08/04/2022 8:53 AM

## 2022-08-04 NOTE — Progress Notes (Signed)
S: Pain mildly improved on lyrica. No new complaints.  MRI brain and orbits wwo  1. Right periorbital soft tissue thickening and hyperenhancement, which appears decreased compared to the 07/31/2022 CT when accounting for differences in technique. 2. No acute intracranial process. 3. No traumatic or inflammatory finding in the orbits.   Personally reviewed; I agree with above interpretation  LP showed tube 1 RBC 17 WBC 5, tube 4 RBC 134 WBC 0, protein 25, glucose 55. HSV and VZV PCR are pending on CSF.    O:  Vitals:   08/04/22 0020 08/04/22 0406  BP: 130/89 (!) 149/96  Pulse: 69 71  Resp: 18 12  Temp: 98.2 F (36.8 C) 98.2 F (36.8 C)  SpO2: 96% 96%   Physical Exam Gen: A&O x4, NAD HEENT: Atraumatic, normocephalic;mucous membranes moist; oropharynx clear, tongue without atrophy or fasciculations. Shingles in V1 distribution on R, R periorbital swelling, R conjunctival injection. Neck: Supple, trachea midline. Resp: CTAB, no w/r/r CV: RRR, no m/g/r; nml S1 and S2. 2+ symmetric peripheral pulses. Abd: soft/NT/ND; nabs x 4 quad Extrem: Nml bulk; no cyanosis, clubbing, or edema.   Neuro: *MS: A&O x4, NAD *Speech: fluid, nondysarthric, able to name and repeat *CN:    I: Deferred   II,III: PERRLA, VFF by confrontation, optic discs unable to be visualized 2/2 pupillary constriction   III,IV,VI: EOMI w/o nystagmus, no ptosis   V: Sensation intact from V1 to V3 to LT   VII: Eyelid closure was full.  Smile symmetric.   VIII: Hearing intact to voice   IX,X: Voice normal, palate elevates symmetrically    XI: SCM/trap 5/5 bilat   XII: Tongue protrudes midline, no atrophy or fasciculations    *Motor:   Normal bulk.  No tremor, rigidity or bradykinesia. No pronator drift.     Strength: Dlt Bic Tri WrE WrF FgS Gr HF KnF KnE PlF DoF    Left 5 5 5 5 5 5 5 5 5 5 5 5     Right 5 5 5 5 5 5 5 5 5 5 5 5       *Sensory: Intact to light touch, pinprick, temperature vibration throughout.  Symmetric. Propioception intact bilat.  No double-simultaneous extinction.  *Coordination:  Finger-to-nose, heel-to-shin, rapid alternating motions were intact. *Reflexes:  2+ and symmetric throughout without clonus; toes down-going bilat *Gait: deferred  A/P:This is a 47 yo woman with hx HTN, migraines who was admitted with herpes zoster opthalmicus HZO and headache, confusion, and fever concerning for meningitis.    - Continue acyclovir CNS dosing - F/u VZV, HSV PCR on CSF - D/c'd fioricet 1/3 - Continnue lyrica 75mg  bid - Dilaudid, percocet prn for pain - Will continue to follow  Su Monks, MD [facility] [phone]  If 7pm- 7am, please page neurology on call as listed in [place].

## 2022-08-05 DIAGNOSIS — A879 Viral meningitis, unspecified: Secondary | ICD-10-CM | POA: Diagnosis not present

## 2022-08-05 LAB — BASIC METABOLIC PANEL
Anion gap: 8 (ref 5–15)
BUN: 5 mg/dL — ABNORMAL LOW (ref 6–20)
CO2: 23 mmol/L (ref 22–32)
Calcium: 8.4 mg/dL — ABNORMAL LOW (ref 8.9–10.3)
Chloride: 108 mmol/L (ref 98–111)
Creatinine, Ser: 0.92 mg/dL (ref 0.44–1.00)
GFR, Estimated: >60 mL/min (ref >60)
Glucose, Bld: 84 mg/dL (ref 70–99)
Potassium: 3.3 mmol/L — ABNORMAL LOW (ref 3.5–5.1)
Sodium: 139 mmol/L (ref 135–145)

## 2022-08-05 LAB — CBC
HCT: 40.2 % (ref 36.0–46.0)
Hemoglobin: 13.7 g/dL (ref 12.0–15.0)
MCH: 31.7 pg (ref 26.0–34.0)
MCHC: 34.1 g/dL (ref 30.0–36.0)
MCV: 93.1 fL (ref 80.0–100.0)
Platelets: 221 10*3/uL (ref 150–400)
RBC: 4.32 MIL/uL (ref 3.87–5.11)
RDW: 12.5 % (ref 11.5–15.5)
WBC: 6 10*3/uL (ref 4.0–10.5)
nRBC: 0 % (ref 0.0–0.2)

## 2022-08-05 LAB — CULTURE, BLOOD (ROUTINE X 2)
Culture: NO GROWTH
Special Requests: ADEQUATE

## 2022-08-05 MED ORDER — HYDROMORPHONE HCL 1 MG/ML IJ SOLN
1.0000 mg | INTRAMUSCULAR | Status: DC | PRN
  Administered 2022-08-05 – 2022-08-12 (×38): 1 mg via INTRAVENOUS
  Filled 2022-08-05 (×38): qty 1

## 2022-08-05 MED ORDER — HYDROMORPHONE HCL 2 MG PO TABS
2.0000 mg | ORAL_TABLET | ORAL | Status: DC | PRN
  Administered 2022-08-05 – 2022-08-11 (×30): 2 mg via ORAL
  Filled 2022-08-05 (×32): qty 1

## 2022-08-05 MED ORDER — POTASSIUM CHLORIDE CRYS ER 20 MEQ PO TBCR
40.0000 meq | EXTENDED_RELEASE_TABLET | Freq: Once | ORAL | Status: AC
  Administered 2022-08-05: 40 meq via ORAL
  Filled 2022-08-05: qty 2

## 2022-08-05 NOTE — Progress Notes (Signed)
Progress Note   Patient: Sara Chapman GXQ:[id number] DOB: 1976-06-21 DOA: 07/31/2022     4 DOS: the patient was seen and examined on 08/05/2022   Brief hospital course: Sara Chapman is a 47 y.o. female with medical history significant of hypertension, migraine headaches, psoriatic arthritis on infliximab, anxiety and depression. She was admitted 07/31/22 with right facial shingles involving the right side of the face, forehead and R eye. She had some associated fever and confusion and so was admitted with empiric abx after LP. She has been seen by ophthalmology in house and is awaiting neurology evaluation as well.  Assessment and Plan: 47 year old female who is being managed for herpes zoster ophthalmicus.  Presents with headache, confusion and fever.  Symptoms concerning for viral meningitis.  Status post LP with CSF results pos for VZV.  Patient has been initiated on empiric treatment.   Herpes zoster ophthalmicus: Failed outpatient treatment on oral antiviral regimen.  Mild visual impairment, limited exam due to right periorbital swelling.  Discussed with ophthalmology who has seen in house on 1/2, continue with steroid eyedrops for treatment of possible viral iritis for 2 weeks as well as systemic antivirals for total 10 days.  Ophthalmology would like to follow up in their office in about one month.   Suspected viral meningitis: Patient is status post lumbar puncture.  Patient was initiated on empiric antibiotics (which have been DC 1/3 since studies neg for bacterial meningitis) and antiviral treatment.  Will monitor for improvement with symptoms. Optimize pain management. Neurology evaluation has been requested.   Severe headache: Likely secondary to viral meningitis: Patient has history of underlying migraine headaches.  Neurology has started her on Lyrica but headache still the same.   Hypertension: Patient is on amlodipine at home.   Anxiety/depression: Patient is on  trazodone, BuSpar and sertraline.  Will order EKG     Advance Care Planning:   Code Status: Full Code    Consults: Neurology and ophthalmology consults.   Family Communication:  Discussed with patient and husband at bedside today.  Subjective:  Seen and examined in her room on 2A.  Still has swelling of face and severe headache. No acute events overnight.  Physical Exam: Vitals:   08/05/22 0031 08/05/22 0233 08/05/22 0516 08/05/22 0720  BP: (!) 152/97 (!) 160/99 (!) 176/102 (!) 147/103  Pulse: 65 65 61 61  Resp: 18 16 20 17   Temp: 98.3 F (36.8 C) 98.3 F (36.8 C) 98.1 F (36.7 C)   TempSrc: Oral Oral Oral   SpO2: 96% 98% 96% 95%  Weight:      Height:      General:  Alert, oriented, calm, in no acute distress, looks tired. Improved swelling Eyes: EOMI, clear conjuctivae, white sclerea Neck: supple, no masses, trachea mildline  Cardiovascular: RRR, no murmurs or rubs, no peripheral edema  Respiratory: clear to auscultation bilaterally, no wheezes, no crackles  Abdomen: soft, nontender, nondistended, normal bowel tones heard  Skin: dry, no rashes  Musculoskeletal: no joint effusions, normal range of motion  Psychiatric: appropriate affect, normal speech  Neurologic: extraocular muscles intact, clear speech, moving all extremities with intact sensorium    Data Reviewed: CBC, BMP, CSF studies including VZV are pending  MR Brain and Orbits 1/3: 1. Right periorbital soft tissue thickening and hyperenhancement, which appears decreased compared to the 07/31/2022 CT when accounting for differences in technique. 2. No acute intracranial process. 3. No traumatic or inflammatory finding in the orbits.  Time spent: 21 minutes  Author: Malu Pellegrini Marry Guan, MD 08/05/2022 10:29 AM  For on call review [email or web address].

## 2022-08-05 NOTE — Plan of Care (Signed)

## 2022-08-05 NOTE — Progress Notes (Signed)
Progress Note   Patient: Sara Chapman GXQ:[id number] DOB: [birth year] DOA: 07/31/2022     4 DOS: the patient was seen and examined on 08/05/2022   Brief hospital course: Sara Chapman is a 47 y.o. female with medical history significant of hypertension, migraine headaches, psoriatic arthritis on infliximab, anxiety and depression. She was admitted 07/31/22 with right facial shingles involving the right side of the face, forehead and R eye. She had some associated fever and confusion and so was admitted with empiric abx after LP. She has been seen by ophthalmology in house and was seen by neurology as well.   Assessment and Plan: 47 year old female who is being managed for herpes zoster ophthalmicus.  Presents with headache, confusion and fever.  Symptoms concerning for viral meningitis.  Status post LP with CSF results pos for VZV.  Continue Acyclovir started in ER.   Herpes zoster ophthalmicus: Failed outpatient treatment on oral antiviral regimen.  Mild visual impairment, limited exam due to right periorbital swelling.  Discussed with ophthalmology who has seen in house on 1/2, continue with steroid eyedrops for treatment of possible viral iritis for 2 weeks as well as systemic antivirals for total 10 days.  Ophthalmology would like to follow up in their office in about one month.   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  Patient was initiated on empiric antibiotics (which have been DC 1/3 since studies neg for bacterial meningitis) and antiviral treatment.  Will monitor for improvement with symptoms. Optimize pain management. Neurology management appreciated.   Severe headache: Likely secondary to viral meningitis: Patient has history of underlying migraine headaches.  Neurology has started her on Lyrica but headache still the same. Will liberalize with PO dilaudid today.   Hypertension: Patient is on amlodipine at home.   Anxiety/depression: Patient is on trazodone,  BuSpar and sertraline.  Will order EKG     Advance Care Planning:   Code Status: Full Code    Consults: Neurology and ophthalmology consults.   Family Communication:  Discussed with patient and husband at bedside today.  Subjective:  Seen in her room this AM, still with bad headache. Discussed med changes and encouraged patient to ambulate.  Physical Exam: Vitals:   08/05/22 0031 08/05/22 0233 08/05/22 0516 08/05/22 0720  BP: (!) 152/97 (!) 160/99 (!) 176/102 (!) 147/103  Pulse: 65 65 61 61  Resp: 18 16 20 17   Temp: 98.3 F (36.8 C) 98.3 F (36.8 C) 98.1 F (36.7 C)   TempSrc: Oral Oral Oral   SpO2: 96% 98% 96% 95%  Weight:      Height:      General:  Alert, oriented, calm, in no acute distress, looks tired. Forehead/eye swelling in much better. Eyes: EOMI, clear conjuctivae, white sclerea Neck: supple, no masses, trachea mildline  Cardiovascular: RRR, no murmurs or rubs, no peripheral edema  Respiratory: clear to auscultation bilaterally, no wheezes, no crackles  Abdomen: soft, nontender, nondistended, normal bowel tones heard  Skin: dry, no rashes  Musculoskeletal: no joint effusions, normal range of motion  Psychiatric: appropriate affect, normal speech  Neurologic: extraocular muscles intact, clear speech, moving all extremities with intact sensorium   Data Reviewed: CBC    Component Value Date/Time   WBC 6.0 08/05/2022 0552   RBC 4.32 08/05/2022 0552   HGB 13.7 08/05/2022 0552   HGB 10.9 (L) 04/21/2013 1027   HCT 40.2 08/05/2022 0552   HCT 32.0 (L) 04/21/2013 1027   PLT 221 08/05/2022 0552   PLT  517 (H) 04/21/2013 1027   MCV 93.1 08/05/2022 0552   MCV 82 04/21/2013 1027   MCH 31.7 08/05/2022 0552   MCHC 34.1 08/05/2022 0552   RDW 12.5 08/05/2022 0552   RDW 14.2 04/21/2013 1027   LYMPHSABS 1.6 07/31/2022 2057   MONOABS 0.4 07/31/2022 2057   EOSABS 0.3 07/31/2022 2057   BASOSABS 0.0 07/31/2022 2057      Latest Ref Rng & Units 08/05/2022    5:52 AM  08/04/2022    7:16 AM 08/03/2022    6:21 AM  BMP  Glucose 70 - 99 mg/dL 84  84  76   BUN 6 - 20 mg/dL 5  6  <5   Creatinine 0.44 - 1.00 mg/dL 0.92  1.10  1.06   Sodium 135 - 145 mmol/L 139  137  138   Potassium 3.5 - 5.1 mmol/L 3.3  3.4  3.3   Chloride 98 - 111 mmol/L 108  109  106   CO2 22 - 32 mmol/L 23  24  25    Calcium 8.9 - 10.3 mg/dL 8.4  7.9  7.9      MR Brain and Orbits 1/3: 1. Right periorbital soft tissue thickening and hyperenhancement, which appears decreased compared to the 07/31/2022 CT when accounting for differences in technique. 2. No acute intracranial process. 3. No traumatic or inflammatory finding in the orbits.  Time spent: 25 minutes  Author: Kahlin Mark Marry Guan, MD 08/05/2022 10:30 AM  For on call review [email or web address].

## 2022-08-05 NOTE — Plan of Care (Signed)
Neurology plan of care  Please see progress note from yesterday for full findings and recommendations. CSF (+) for VZV. Final recommendations:  - acyclovir 10 mg/kg/dose every 8 hours for total of 10-14 day course - continue lyrica 75mg  bid. may increase in increments of 25 to 150 mg/day at weekly intervals if needed for pain control - dilaudid is ordered prn for breakthrough pain - neurology will be available prn for questions going forward, feel free to re-engage [place] if new concerns arise  Su Monks, MD [facility] [phone]  If 7pm- 7am, please page neurology on call as listed in [place].

## 2022-08-06 DIAGNOSIS — A879 Viral meningitis, unspecified: Secondary | ICD-10-CM | POA: Diagnosis not present

## 2022-08-06 LAB — CBC
HCT: 38.4 % (ref 36.0–46.0)
Hemoglobin: 13.3 g/dL (ref 12.0–15.0)
MCH: 31.6 pg (ref 26.0–34.0)
MCHC: 34.6 g/dL (ref 30.0–36.0)
MCV: 91.2 fL (ref 80.0–100.0)
Platelets: 229 10*3/uL (ref 150–400)
RBC: 4.21 MIL/uL (ref 3.87–5.11)
RDW: 12.5 % (ref 11.5–15.5)
WBC: 6.6 10*3/uL (ref 4.0–10.5)
nRBC: 0 % (ref 0.0–0.2)

## 2022-08-06 LAB — BASIC METABOLIC PANEL
Anion gap: 7 (ref 5–15)
BUN: 7 mg/dL (ref 6–20)
CO2: 23 mmol/L (ref 22–32)
Calcium: 8.3 mg/dL — ABNORMAL LOW (ref 8.9–10.3)
Chloride: 109 mmol/L (ref 98–111)
Creatinine, Ser: 1 mg/dL (ref 0.44–1.00)
GFR, Estimated: >60 mL/min (ref >60)
Glucose, Bld: 85 mg/dL (ref 70–99)
Potassium: 3.7 mmol/L (ref 3.5–5.1)
Sodium: 139 mmol/L (ref 135–145)

## 2022-08-06 LAB — CULTURE, BLOOD (ROUTINE X 2): Culture: NO GROWTH

## 2022-08-06 LAB — MAGNESIUM: Magnesium: 1.8 mg/dL (ref 1.7–2.4)

## 2022-08-06 MED ORDER — AMLODIPINE BESYLATE 5 MG PO TABS
5.0000 mg | ORAL_TABLET | Freq: Every day | ORAL | Status: DC
  Administered 2022-08-06 – 2022-08-14 (×9): 5 mg via ORAL
  Filled 2022-08-06 (×9): qty 1

## 2022-08-06 NOTE — Progress Notes (Signed)
Progress Note   Patient: Sara Chapman ZOX:[id number] DOB: 1976-02-16 DOA: 07/31/2022     5 DOS: the patient was seen and examined on 08/06/2022   Brief hospital course: Sara Chapman is a 48 y.o. female with medical history significant of hypertension, migraine headaches, psoriatic arthritis on infliximab, anxiety and depression. She was admitted 07/31/22 with right facial shingles involving the right side of the face, forehead and R eye. She had some associated fever and confusion and so was admitted with empiric abx after LP. She has been seen by ophthalmology in house and was seen by neurology as well.   Assessment and Plan: 47 year old female who is being managed for herpes zoster ophthalmicus.  Presents with headache, confusion and fever.  Symptoms concerning for viral meningitis.  Status post LP with CSF results pos for VZV.  Continue Acyclovir started in ER.   Herpes zoster ophthalmicus: Failed outpatient treatment on oral antiviral regimen.  Mild visual impairment, limited exam due to right periorbital swelling.  Discussed with ophthalmology who has seen in house on 1/2, continue with steroid eyedrops for treatment of possible viral iritis for 2 weeks as well as systemic antivirals for total 10 days which would be 1/10.  Ophthalmology would like to follow up in their office in about one month.   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  Patient was initiated on empiric antibiotics (which have been DC 1/3 since studies neg for bacterial meningitis) and antiviral treatment to continue until at least 1/10.  Will monitor for improvement with symptoms. Optimize pain management. Neurology management appreciated.   Severe headache: Likely secondary to viral meningitis: Patient has history of underlying migraine headaches.  Neurology has started her on Lyrica but headache still the same. Pain control better after starting PO Dilaudid on 1/6.   Hypertension: Patient is on  amlodipine 2.5 at home, will resume at 5mg  PO daily today since BP has been consistently in the 150s.   Anxiety/depression: Patient is on trazodone, BuSpar and sertraline.  Will order EKG     Advance Care Planning:   Code Status: Full Code    Consults: Neurology and ophthalmology consults.   Family Communication:  Discussed with patient, husband not present this morning.  Subjective:  Seen in her room this AM, headache much better today.  Physical Exam: Vitals:   08/05/22 2321 08/06/22 0230 08/06/22 0451 08/06/22 0815  BP: (!) 154/88 (!) 134/91 (!) 150/94 (!) 154/100  Pulse: 64 71 64 68  Resp: 17 17 19 18   Temp: 98.4 F (36.9 C) 98.2 F (36.8 C) 98 F (36.7 C) 98 F (36.7 C)  TempSrc: Oral Oral    SpO2: 97% 97% 95% 98%  Weight:      Height:      General:  Alert, oriented, calm, in no acute distress  Eyes: EOMI, clear conjuctivae, white sclerea; swelling much improved as is rash Neck: supple, no masses, trachea mildline  Cardiovascular: RRR, no murmurs or rubs, no peripheral edema  Respiratory: clear to auscultation bilaterally, no wheezes, no crackles  Abdomen: soft, nontender, nondistended, normal bowel tones heard  Skin: dry, no rashes  Musculoskeletal: no joint effusions, normal range of motion  Psychiatric: appropriate affect, normal speech  Neurologic: extraocular muscles intact, clear speech, moving all extremities with intact sensorium    Data Reviewed: CBC    Component Value Date/Time   WBC 6.6 08/06/2022 0650   RBC 4.21 08/06/2022 0650   HGB 13.3 08/06/2022 0650   HGB 10.9 (  L) 04/21/2013 1027   HCT 38.4 08/06/2022 0650   HCT 32.0 (L) 04/21/2013 1027   PLT 229 08/06/2022 0650   PLT 517 (H) 04/21/2013 1027   MCV 91.2 08/06/2022 0650   MCV 82 04/21/2013 1027   MCH 31.6 08/06/2022 0650   MCHC 34.6 08/06/2022 0650   RDW 12.5 08/06/2022 0650   RDW 14.2 04/21/2013 1027   LYMPHSABS 1.6 07/31/2022 2057   MONOABS 0.4 07/31/2022 2057   EOSABS 0.3 07/31/2022  2057   BASOSABS 0.0 07/31/2022 2057      Latest Ref Rng & Units 08/06/2022    6:50 AM 08/05/2022    5:52 AM 08/04/2022    7:16 AM  BMP  Glucose 70 - 99 mg/dL 85  84  84   BUN 6 - 20 mg/dL 7  5  6    Creatinine 0.44 - 1.00 mg/dL  2.12  2.48   Sodium 135 - 145 mmol/L 139  139  137   Potassium 3.5 - 5.1 mmol/L 3.7  3.3  3.4   Chloride 98 - 111 mmol/L 109  108  109   CO2 22 - 32 mmol/L 23  23  24    Calcium 8.9 - 10.3 mg/dL 8.3  8.4  7.9      MR Brain and Orbits 1/3: 1. Right periorbital soft tissue thickening and hyperenhancement, which appears decreased compared to the 07/31/2022 CT when accounting for differences in technique. 2. No acute intracranial process. 3. No traumatic or inflammatory finding in the orbits.  Time spent: 25 minutes  Author: Havannah Streat , MD 08/06/2022 9:56 AM  For on call review [email or web address].

## 2022-08-06 NOTE — Plan of Care (Signed)

## 2022-08-07 DIAGNOSIS — A879 Viral meningitis, unspecified: Secondary | ICD-10-CM | POA: Diagnosis not present

## 2022-08-07 LAB — CBC
HCT: 39.6 % (ref 36.0–46.0)
Hemoglobin: 13.6 g/dL (ref 12.0–15.0)
MCH: 31.9 pg (ref 26.0–34.0)
MCHC: 34.3 g/dL (ref 30.0–36.0)
MCV: 93 fL (ref 80.0–100.0)
Platelets: 238 10*3/uL (ref 150–400)
RBC: 4.26 MIL/uL (ref 3.87–5.11)
RDW: 12.8 % (ref 11.5–15.5)
WBC: 6 10*3/uL (ref 4.0–10.5)
nRBC: 0 % (ref 0.0–0.2)

## 2022-08-07 LAB — BASIC METABOLIC PANEL
Anion gap: 7 (ref 5–15)
BUN: 7 mg/dL (ref 6–20)
CO2: 25 mmol/L (ref 22–32)
Calcium: 8.3 mg/dL — ABNORMAL LOW (ref 8.9–10.3)
Chloride: 107 mmol/L (ref 98–111)
Creatinine, Ser: 1 mg/dL (ref 0.44–1.00)
GFR, Estimated: >60 mL/min (ref >60)
Glucose, Bld: 94 mg/dL (ref 70–99)
Potassium: 3.5 mmol/L (ref 3.5–5.1)
Sodium: 139 mmol/L (ref 135–145)

## 2022-08-07 NOTE — Progress Notes (Signed)
Pharmacy Antibiotic Note  Sara Chapman is a 47 y.o. 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  Pharmacy has been consulted for Acyclovir dosing.  Varicella Zoster Virus DNA CSF detected  HSV 1/2 PCR CSF negative.     Plan: Day 7 of acyclovir. Continue acyclovir IV 530 mg TID (10 mg/kg; AdjBW)   Height: 4\' 10"  (147.3 cm) Weight: 65.3 kg (144 lb) IBW/kg (Calculated) : 40.9  Temp (24hrs), Avg:98.3 F (36.8 C), Min:98.1 F (36.7 C), Max:98.4 F (36.9 C)  Recent Labs  Lab 07/31/22 2057 08/01/22 0640 08/03/22 0621 08/04/22 0716 08/05/22 0552 08/06/22 0650 08/07/22 0525  WBC 6.1   < > 5.6 6.1 6.0 6.6 6.0  CREATININE 0.98   < > 1.06* 1.10* 0.92 1.00 1.00  LATICACIDVEN 1.1  --   --   --   --   --   --    < > = values in this interval not displayed.     Estimated Creatinine Clearance: 55.1 mL/min (by C-G formula based on SCr of 1 mg/dL).    Allergies  Allergen Reactions   Clonazepam Other (See Comments)    SUICIDAL THOUGHTS     Ibuprofen Other (See Comments)    stomach ulcers    Nsaids Other (See Comments)    GI ulcers   Tolmetin Other (See Comments)    GI ulcers    Pseudoephedrine Hcl Palpitations and Other (See Comments)        Tape Rash    Adhesive tape breaks down the skin and causes sores, use paper tape     Microbiology results: 1/1 BCx: no growth x 5 days 1/1 CSF Cx: no growth  Thank you for allowing pharmacy to be a part of this patient's care.  Wynelle Cleveland, PharmD, BCPS 08/07/2022 11:40 AM

## 2022-08-07 NOTE — Progress Notes (Signed)
Spoke with [facility] about dilaudid orders to administer every 4 hours. Staff is receiving an alert that the medication is being administered to close together. Given permission by NP [facility] to administer medication every 2 hours as needed.

## 2022-08-07 NOTE — Progress Notes (Signed)
Progress Note   Patient: Sara Chapman VQM:[id number] DOB: 07-Mar-1976 DOA: 07/31/2022     6 DOS: the patient was seen and examined on 08/07/2022   Brief hospital course: Sara Chapman is a 47 y.o. female with medical history significant of hypertension, migraine headaches, psoriatic arthritis on infliximab, anxiety and depression. She was admitted 07/31/22 with right facial shingles involving the right side of the face, forehead and R eye. She had some associated fever and confusion and so was admitted with empiric abx after LP. She has been seen by ophthalmology in house and was seen by neurology as well.   Assessment and Plan: 47 year old female who is being managed for herpes zoster ophthalmicus.  Presents with headache, confusion and fever.  Symptoms concerning for viral meningitis.  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  Continue Acyclovir started in ER, due to continued headache and immune compromised status would anticipate completing total 14 days of Acyclovir which would be on 1/15.   Herpes zoster ophthalmicus: Failed outpatient treatment on oral antiviral regimen.  Mild visual impairment, limited exam due to right periorbital swelling.  Discussed with ophthalmology who has seen in house on 1/2, continue with steroid eyedrops for treatment of possible viral iritis for 2 weeks as well as systemic antivirals for total 14 days which would be 1/15.  Ophthalmology would like to follow up in their office in about one month.   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  Patient was initiated on empiric antibiotics (which have been DC 1/3 since studies neg for bacterial meningitis) and antiviral treatment to continue until at least 1/10.  Will monitor for improvement with symptoms. Optimize pain management. Neurology management appreciated.   Severe headache: Due to viral meningitis: Patient has history of underlying migraine  headaches.  Neurology has started her on Lyrica but headache still the same. Pain control better after starting PO Dilaudid on 1/6.   Hypertension: Patient is on amlodipine 2.5 at home, will resume at 5mg  PO daily today since BP has been consistently in the 150s.   Anxiety/depression: Patient is on trazodone, BuSpar and sertraline.  Will order EKG     Advance Care Planning:   Code Status: Full Code    Consults: Neurology and ophthalmology consults.   Family Communication:  Discussed with patient, husband not present this morning.  Subjective:  Seen in her room this AM, headache much better overall but had some worse headache this AM after Dilaudid dose was delayed this AM.  Physical Exam: Vitals:   08/06/22 2000 08/06/22 2305 08/07/22 0403 08/07/22 0846  BP: (!) 138/98 127/79 (!) 132/96 (!) 155/95  Pulse: 64 69 67 64  Resp: 20 20 20 18   Temp: 98.3 F (36.8 C) 98.2 F (36.8 C) 98.3 F (36.8 C) 98.4 F (36.9 C)  TempSrc: Oral Oral Oral Oral  SpO2: 96% 94% 94% 100%  Weight:      Height:      General:  Alert, oriented, calm, in no acute distress, resting in bed  Eyes: EOMI, clear conjuctivae, white sclerea; swelling much better almost resolved Neck: supple, no masses, trachea mildline  Cardiovascular: RRR, no murmurs or rubs, no peripheral edema  Respiratory: clear to auscultation bilaterally, no wheezes, no crackles  Abdomen: soft, nontender, nondistended, normal bowel tones heard  Skin: dry, no rashes  Musculoskeletal: no joint effusions, normal range of motion  Psychiatric: appropriate affect, normal speech  Neurologic: extraocular muscles intact, clear  speech, moving all extremities with intact sensorium   Data Reviewed: CBC    Component Value Date/Time   WBC 6.0 08/07/2022 0525   RBC 4.26 08/07/2022 0525   HGB 13.6 08/07/2022 0525   HGB 10.9 (L) 04/21/2013 1027   HCT 39.6 08/07/2022 0525   HCT 32.0 (L) 04/21/2013 1027   PLT 238 08/07/2022 0525   PLT 517 (H)  04/21/2013 1027   MCV 93.0 08/07/2022 0525   MCV 82 04/21/2013 1027   MCH 31.9 08/07/2022 0525   MCHC 34.3 08/07/2022 0525   RDW 12.8 08/07/2022 0525   RDW 14.2 04/21/2013 1027   LYMPHSABS 1.6 07/31/2022 2057   MONOABS 0.4 07/31/2022 2057   EOSABS 0.3 07/31/2022 2057   BASOSABS 0.0 07/31/2022 2057      Latest Ref Rng & Units 08/07/2022    5:25 AM 08/06/2022    6:50 AM 08/05/2022    5:52 AM  BMP  Glucose 70 - 99 mg/dL 94  85  84   BUN 6 - 20 mg/dL 7  7  5    Creatinine 0.44 - 1.00 mg/dL 1.00  1.00  0.92   Sodium 135 - 145 mmol/L 139  139  139   Potassium 3.5 - 5.1 mmol/L 3.5  3.7  3.3   Chloride 98 - 111 mmol/L 107  109  108   CO2 22 - 32 mmol/L 25  23  23    Calcium 8.9 - 10.3 mg/dL 8.3  8.3  8.4    MR Brain and Orbits 1/3: 1. Right periorbital soft tissue thickening and hyperenhancement, which appears decreased compared to the 07/31/2022 CT when accounting for differences in technique. 2. No acute intracranial process. 3. No traumatic or inflammatory finding in the orbits.  Time spent: 25 minutes  Author: Libni Fusaro Marry Guan, MD 08/07/2022 11:02 AM  For on call review [email or web address].

## 2022-08-08 DIAGNOSIS — A879 Viral meningitis, unspecified: Secondary | ICD-10-CM | POA: Diagnosis not present

## 2022-08-08 LAB — BASIC METABOLIC PANEL
Anion gap: 6 (ref 5–15)
BUN: 7 mg/dL (ref 6–20)
CO2: 24 mmol/L (ref 22–32)
Calcium: 8.6 mg/dL — ABNORMAL LOW (ref 8.9–10.3)
Chloride: 109 mmol/L (ref 98–111)
Creatinine, Ser: 0.83 mg/dL (ref 0.44–1.00)
GFR, Estimated: >60 mL/min (ref >60)
Glucose, Bld: 80 mg/dL (ref 70–99)
Potassium: 3.5 mmol/L (ref 3.5–5.1)
Sodium: 139 mmol/L (ref 135–145)

## 2022-08-08 LAB — CBC
HCT: 39.8 % (ref 36.0–46.0)
Hemoglobin: 13.8 g/dL (ref 12.0–15.0)
MCH: 32.1 pg (ref 26.0–34.0)
MCHC: 34.7 g/dL (ref 30.0–36.0)
MCV: 92.6 fL (ref 80.0–100.0)
Platelets: 238 10*3/uL (ref 150–400)
RBC: 4.3 MIL/uL (ref 3.87–5.11)
RDW: 12.9 % (ref 11.5–15.5)
WBC: 6.8 10*3/uL (ref 4.0–10.5)
nRBC: 0 % (ref 0.0–0.2)

## 2022-08-08 MED ORDER — BISACODYL 5 MG PO TBEC
5.0000 mg | DELAYED_RELEASE_TABLET | Freq: Once | ORAL | Status: DC
  Filled 2022-08-08: qty 1

## 2022-08-08 NOTE — Progress Notes (Signed)
Progress Note   Patient: Sara Chapman TFT:[id number] DOB: 1975/12/21 DOA: 07/31/2022     7 DOS: the patient was seen and examined on 08/08/2022   Brief hospital course: Alfreda Hammad is a 47 y.o. female with medical history significant of hypertension, migraine headaches, psoriatic arthritis on infliximab, anxiety and depression. She was admitted 07/31/22 with right facial shingles involving the right side of the face, forehead and R eye. She had some associated fever and confusion and so was admitted with empiric abx after LP. She has been seen by ophthalmology in house and was seen by neurology as well.   Assessment and Plan: 47 year old female who is being managed for herpes zoster ophthalmicus.  Presents with headache, confusion and fever.  Symptoms concerning for viral meningitis.  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  Continue Acyclovir started in ER, due to continued headache and immune compromised status would anticipate completing total 14 days of Acyclovir which would be on 1/15.   Herpes zoster ophthalmicus: Failed outpatient treatment on oral antiviral regimen.  Mild visual impairment, limited exam due to right periorbital swelling.  Discussed with ophthalmology who has seen in house on 1/2, continue with steroid eyedrops for treatment of possible viral iritis for 2 weeks as well as systemic antivirals for total 14 days which would be 1/15.  Ophthalmology would like to follow up in their office in about one month.   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  Patient was initiated on empiric antibiotics (which have been DC 1/3 since studies neg for bacterial meningitis) and antiviral treatment to continue until at least 1/10.  Will monitor for improvement with symptoms. Optimize pain management. Neurology management appreciated.   Severe headache: Due to viral meningitis: Patient has history of underlying migraine  headaches.  Neurology has started her on Lyrica but headache still the same. Pain control better after starting PO Dilaudid on 1/6.   Hypertension: Patient is on amlodipine 2.5 at home, will resume at 5mg  PO daily since BP has been consistently in the 150s.  Blood pressure now improved.   Anxiety/depression: Patient is on trazodone, BuSpar and sertraline.       Advance Care Planning:   Code Status: Full Code    Consults: Neurology and ophthalmology consults.   Family Communication:  Discussed with patient as well as her husband this morning 1/9.  Subjective:  Seen in her room this morning with her husband at the bedside.  She is doing well, overnight was seen by nurse practitioner who clarified Dilaudid orders with staff.  Physical Exam: Vitals:   08/07/22 2102 08/08/22 0057 08/08/22 0519 08/08/22 0901  BP: (!) 149/88 133/87 (!) 132/92 (!) 139/99  Pulse: 65 76 63 61  Resp: 20 18 20 18   Temp: 98.4 F (36.9 C) 98.2 F (36.8 C) 98.4 F (36.9 C) 98.4 F (36.9 C)  TempSrc: Oral Oral Oral Oral  SpO2: 96% 94% 93% 91%  Weight:      Height:      General exam: Appears calm and comfortable, lying in bed with husband at the bedside as well Respiratory system: Clear to auscultation. Respiratory effort normal. Cardiovascular system: S1 & S2 heard, RRR. No JVD, murmurs, rubs, gallops or clicks. No pedal edema. Gastrointestinal system: Abdomen is nondistended, soft and nontender. No organomegaly or masses felt. Normal bowel sounds heard. Central nervous system: Alert and orientedx3. No focal neurological deficits. Extremities: Symmetric 5 x 5 power. Skin:  No rashes, lesions or ulcers Psychiatry: Judgement and insight appear normal. Mood & affect appropriate.  Data Reviewed: CBC    Component Value Date/Time   WBC 6.8 08/08/2022 0628   RBC 4.30 08/08/2022 0628   HGB 13.8 08/08/2022 0628   HGB 10.9 (L) 04/21/2013 1027   HCT 39.8 08/08/2022 0628   HCT 32.0 (L) 04/21/2013 1027   PLT 238  08/08/2022 0628   PLT 517 (H) 04/21/2013 1027   MCV 92.6 08/08/2022 0628   MCV 82 04/21/2013 1027   MCH 32.1 08/08/2022 0628   MCHC 34.7 08/08/2022 0628   RDW 12.9 08/08/2022 0628   RDW 14.2 04/21/2013 1027   LYMPHSABS 1.6 07/31/2022 2057   MONOABS 0.4 07/31/2022 2057   EOSABS 0.3 07/31/2022 2057   BASOSABS 0.0 07/31/2022 2057      Latest Ref Rng & Units 08/08/2022    6:28 AM 08/07/2022    5:25 AM 08/06/2022    6:50 AM  BMP  Glucose 70 - 99 mg/dL 80  94  85   BUN 6 - 20 mg/dL 7  7  7    Creatinine 0.44 - 1.00 mg/dL  8.27  0.78   Sodium 135 - 145 mmol/L 139  139  139   Potassium 3.5 - 5.1 mmol/L 3.5  3.5  3.7   Chloride 98 - 111 mmol/L 109  107  109   CO2 22 - 32 mmol/L 24  25  23    Calcium 8.9 - 10.3 mg/dL 8.6  8.3  8.3    MR Brain and Orbits 1/3: 1. Right periorbital soft tissue thickening and hyperenhancement, which appears decreased compared to the 07/31/2022 CT when accounting for differences in technique. 2. No acute intracranial process. 3. No traumatic or inflammatory finding in the orbits.  Time spent: 25 minutes  Author: Calina Patrie , MD 08/08/2022 12:12 PM  For on call review [email or web address].

## 2022-08-08 NOTE — Progress Notes (Addendum)
       CROSS COVER NOTE  NAME: Sara Chapman MRN: [id number] DOB : 02-05-1976 ATTENDING PHYSICIAN: Hollice Gong, Mir Mohammed*    Date of Service   08/08/2022   HPI/Events of Note   Called to bedside overnight because patient would like to speak with provider about not receiving Dilaudid PRN as ordered. Sara Chapman explains that she has been receiving Dilaudid q2H and the staff tonight are not providing the medication at that interval. The patient currently has active orders for IV Dilaudid q4H PRN and PO Dilaudid q4H PRN. The orders are not linked orders. She is able to get each route q4H, if staggered then she can get EITHER IV or PO every 2 hours. Staff educated to follow orders as written and stagger meds if that is the patient's preference so that a pain med is available for her to take q2H.  Interventions   Assessment/Plan:  Continue IV and PO dilaudid as ordered Staff Education         To reach the provider On-Call:   7AM- 7PM see care teams to locate the attending and reach out to them via [email or web address]. 7PM-7AM contact night-coverage If you still have difficulty reaching the appropriate provider, please page the [facility] (Director on Call) for [facility] on amion for assistance  This document was prepared using Set designer software and may include unintentional dictation errors.  Sara Glass DNP, MBA, FNP-BC Nurse Practitioner [facility] Pager [phone]

## 2022-08-09 DIAGNOSIS — A879 Viral meningitis, unspecified: Secondary | ICD-10-CM | POA: Diagnosis not present

## 2022-08-09 NOTE — TOC Initial Note (Signed)
Transition of Care [facility]) - Initial/Assessment Note    Patient Details  Name: Sara Chapman MRN: [id number] Date of Birth: 1976-06-10  Transition of Care [facility]) CM/SW Contact:    Laurena Slimmer, RN Phone Number: 08/09/2022, 10:57 AM  Clinical Narrative:                  Transition of Care [facility]) Screening Note   Patient Details  Name: Sara Chapman Date of Birth: 1975/11/28   Transition of Care [facility]) CM/SW Contact:    Laurena Slimmer, RN Phone Number: 08/09/2022, 10:57 AM    Transition of Care Department [facility]) has reviewed patient and no TOC needs have been identified at this time. We will continue to monitor patient advancement through interdisciplinary progression rounds. If new patient transition needs arise, please place a TOC consult.          Patient Goals and CMS Choice            Expected Discharge Plan and Services                                              Prior Living Arrangements/Services                       Activities of Daily Living Home Assistive Devices/Equipment: Eyeglasses ADL Screening (condition at time of admission) Patient's cognitive ability adequate to safely complete daily activities?: Yes Is the patient deaf or have difficulty hearing?: No Does the patient have difficulty seeing, even when wearing glasses/contacts?: No Does the patient have difficulty concentrating, remembering, or making decisions?: No Patient able to express need for assistance with ADLs?: Yes Does the patient have difficulty dressing or bathing?: No Independently performs ADLs?: Yes (appropriate for developmental age) Does the patient have difficulty walking or climbing stairs?: No Weakness of Legs: None Weakness of Arms/Hands: None  Permission Sought/Granted                  Emotional Assessment              Admission diagnosis:  Viral meningitis [A87.9] Patient Active Problem List   Diagnosis Date Noted    Viral meningitis 08/01/2022   Morbid obesity ([place]) 01/23/2022   S/P laparoscopic colectomy 12/01/2019   Essential hypertension    Chronic nonintractable headache    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[place])    Psoriatic arthritis ([place])    Acute diverticulitis 10/18/2019   Sepsis ([place]) 10/18/2019   Diverticulitis large intestine 09/16/2019   Colonic diverticular abscess 09/03/2019   PCP:  Midge Minium, PA Pharmacy:   [facility] [id number] - [address], [place] [address] Phone: [phone] Fax: [place] [address] Phone: [phone] Fax: [phone]     Social Determinants of Health (SDOH) Social History: SDOH Screenings   Food Insecurity: No Food Insecurity (08/02/2022)  Housing: Low Risk  (08/02/2022)  Transportation Needs: No Transportation Needs (08/02/2022)  Utilities: Not At Risk (08/02/2022)  Depression (PHQ2-9): Low Risk  (11/30/2021)  Tobacco Use: Low Risk  (07/31/2022)   SDOH Interventions:     Readmission Risk Interventions     No data to display

## 2022-08-09 NOTE — Progress Notes (Signed)
Progress Note   Patient: Sara Chapman RWE:[id number] DOB: Apr 23, 1976 DOA: 07/31/2022     7 DOS: the patient was seen and examined on 08/08/2022   Brief hospital course: Sara Chapman is a 47 y.o. female with medical history significant of hypertension, migraine headaches, psoriatic arthritis on infliximab, anxiety and depression. She was admitted 07/31/22 with right facial shingles involving the right side of the face, forehead and R eye. She had some associated fever and confusion and so was admitted with empiric abx after LP. She has been seen by ophthalmology in house and was seen by neurology as well.   Assessment and Plan: 47 year old female who is being managed for herpes zoster ophthalmicus.  Presents with headache, confusion and fever.  Symptoms concerning for viral meningitis.  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  Continue Acyclovir started in ER, due to continued headache and immune compromised status would anticipate completing total 14 days of Acyclovir which would be on 1/15.   Herpes zoster ophthalmicus: Failed outpatient treatment on oral antiviral regimen.  Mild visual impairment, limited exam due to right periorbital swelling.  Discussed with ophthalmology who has seen in house on 1/2, continue with steroid eyedrops for treatment of possible viral iritis for 2 weeks as well as systemic antivirals for total 14 days which would be 1/15.  Ophthalmology would like to follow up in their office in about one month.   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  Patient was initiated on empiric antibiotics (which have been DC 1/3 since studies neg for bacterial meningitis) and antiviral treatment to continue until at least 1/10.  Will monitor for improvement with symptoms. Optimize pain management. Neurology management appreciated.   Severe headache: Due to viral meningitis: Patient has history of underlying migraine  headaches.  Neurology has started her on Lyrica but headache still the same. Pain control better after starting PO Dilaudid on 1/6.   Hypertension: Patient is on amlodipine 2.5 at home, will resume at 5mg  PO daily since BP has been consistently in the 150s.  Blood pressure now improved.   Anxiety/depression: Patient is on trazodone, BuSpar and sertraline.        Advance Care Planning:   Code Status: Full Code    Consults: Neurology and ophthalmology consults.   Family Communication:  Husband at bedside on rounds this morning 1/10.  Subjective:  Seen in her room this morning with her husband at the bedside.  She is doing well, overnight was seen by nurse practitioner who clarified Dilaudid orders with staff.  Physical Exam: Vitals:   08/07/22 2102 08/08/22 0057 08/08/22 0519 08/08/22 0901  BP: (!) 149/88 133/87 (!) 132/92 (!) 139/99  Pulse: 65 76 63 61  Resp: 20 18 20 18   Temp: 98.4 F (36.9 C) 98.2 F (36.8 C) 98.4 F (36.9 C) 98.4 F (36.9 C)  TempSrc: Oral Oral Oral Oral  SpO2: 96% 94% 93% 91%  Weight:      Height:        General exam: awake, alert, no acute distress HEENT: atraumatic, clear conjunctiva, anicteric sclera, moist mucus membranes, hearing grossly normal  Respiratory system: CTAB, no wheezes, rales or rhonchi, normal respiratory effort. Cardiovascular system: normal S1/S2, RRR, no JVD, murmurs, rubs, gallops, no pedal edema.   Gastrointestinal system: soft, NT, ND, no HSM felt, +bowel sounds. Central nervous system: A&O x4. no gross focal neurologic deficits, normal speech Extremities: moves all, no edema, normal tone  Skin: dry, intact, normal temperature Psychiatry: normal mood, congruent affect, judgement and insight appear normal  .  Data Reviewed: CBC    Component Value Date/Time   WBC 6.8 08/08/2022 0628   RBC 4.30 08/08/2022 0628   HGB 13.8 08/08/2022 0628   HGB 10.9 (L) 04/21/2013 1027   HCT 39.8 08/08/2022 0628   HCT 32.0 (L) 04/21/2013  1027   PLT 238 08/08/2022 0628   PLT 517 (H) 04/21/2013 1027   MCV 92.6 08/08/2022 0628   MCV 82 04/21/2013 1027   MCH 32.1 08/08/2022 0628   MCHC 34.7 08/08/2022 0628   RDW 12.9 08/08/2022 0628   RDW 14.2 04/21/2013 1027   LYMPHSABS 1.6 07/31/2022 2057   MONOABS 0.4 07/31/2022 2057   EOSABS 0.3 07/31/2022 2057   BASOSABS 0.0 07/31/2022 2057      Latest Ref Rng & Units 08/08/2022    6:28 AM 08/07/2022    5:25 AM 08/06/2022    6:50 AM  BMP  Glucose 70 - 99 mg/dL 80  94  85   BUN 6 - 20 mg/dL 7  7  7    Creatinine 0.44 - 1.00 mg/dL 0.83  1.00  1.00   Sodium 135 - 145 mmol/L 139  139  139   Potassium 3.5 - 5.1 mmol/L 3.5  3.5  3.7   Chloride 98 - 111 mmol/L 109  107  109   CO2 22 - 32 mmol/L 24  25  23    Calcium 8.9 - 10.3 mg/dL 8.6  8.3  8.3    MR Brain and Orbits 1/3: 1. Right periorbital soft tissue thickening and hyperenhancement, which appears decreased compared to the 07/31/2022 CT when accounting for differences in technique. 2. No acute intracranial process. 3. No traumatic or inflammatory finding in the orbits.  Time spent: 35 minutes  Author: Dr. Nicole Kindred, DO [facility] 08/09/22  2:55 PM   For on call review [email or web address].

## 2022-08-09 NOTE — Progress Notes (Addendum)
Pharmacy Antibiotic Note  Sara Chapman is a 47 y.o. 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  Pharmacy has been consulted for Acyclovir dosing.  Varicella Zoster Virus DNA CSF detected  HSV 1/2 PCR CSF negative.    Plan: Day 7 of acyclovir with adjusted dose. Per [place] policy acyclovir dosing recommended using TBW or AdjBW if TBW >140 IBW.  Continue acyclovir dose adjusted using AdjBW 10 mg/kg TID. Plan for 14 days of treatment.   Continue fluids NS @ 75 ml/hr while on acyclovir.    Height: 4\' 10"  (147.3 cm) Weight: 65.3 kg (144 lb) IBW/kg (Calculated) : 40.9  Temp (24hrs), Avg:98.2 F (36.8 C), Min:97.7 F (36.5 C), Max:98.6 F (37 C)  Recent Labs  Lab 08/04/22 0716 08/05/22 0552 08/06/22 0650 08/07/22 0525 08/08/22 0628  WBC 6.1 6.0 6.6 6.0 6.8  CREATININE 1.10* 0.92 1.00 1.00 0.83     Estimated Creatinine Clearance: 66.3 mL/min (by C-G formula based on SCr of 0.83 mg/dL).    Allergies  Allergen Reactions   Clonazepam Other (See Comments)    SUICIDAL THOUGHTS     Ibuprofen Other (See Comments)    stomach ulcers    Nsaids Other (See Comments)    GI ulcers   Tolmetin Other (See Comments)    GI ulcers    Pseudoephedrine Hcl Palpitations and Other (See Comments)        Tape Rash    Adhesive tape breaks down the skin and causes sores, use paper tape     Microbiology results: 1/1 BCx: NGTD 1/1 CSF Cx: NGTD  Thank you for allowing pharmacy to be a part of this patient's care.  Oswald Hillock, PharmD, BCPS 08/09/2022 9:23 AM

## 2022-08-10 DIAGNOSIS — A879 Viral meningitis, unspecified: Secondary | ICD-10-CM | POA: Diagnosis not present

## 2022-08-10 MED ORDER — DEXAMETHASONE SODIUM PHOSPHATE 10 MG/ML IJ SOLN
10.0000 mg | Freq: Once | INTRAMUSCULAR | Status: AC
  Administered 2022-08-10: 10 mg via INTRAVENOUS
  Filled 2022-08-10: qty 1

## 2022-08-10 MED ORDER — BUTALBITAL-APAP-CAFFEINE 50-325-40 MG PO TABS
1.0000 | ORAL_TABLET | Freq: Once | ORAL | Status: AC
  Administered 2022-08-10: 1 via ORAL
  Filled 2022-08-10: qty 1

## 2022-08-10 MED ORDER — LACTATED RINGERS IV BOLUS
1000.0000 mL | Freq: Once | INTRAVENOUS | Status: AC
  Administered 2022-08-10: 1000 mL via INTRAVENOUS

## 2022-08-10 MED ORDER — HYDROMORPHONE HCL 1 MG/ML IJ SOLN
1.0000 mg | Freq: Once | INTRAMUSCULAR | Status: AC
  Administered 2022-08-11: 1 mg via INTRAVENOUS

## 2022-08-10 NOTE — Progress Notes (Signed)
Progress Note   Patient: Sara Chapman WGN:[id number] DOB: 1976/04/20 DOA: 07/31/2022     9 DOS: the patient was seen and examined on 08/10/2022   Brief hospital course: Sara Chapman is a 47 y.o. female with medical history significant of hypertension, migraine headaches, psoriatic arthritis on infliximab, anxiety and depression. She was admitted 07/31/22 with right facial shingles involving the right side of the face, forehead and R eye. She had some associated fever and confusion and so was admitted with empiric abx after LP. She has been seen by ophthalmology in house and was seen by neurology as well.   Assessment and Plan: 47 year old female who is being managed for herpes zoster ophthalmicus.  Presents with headache, confusion and fever.  Symptoms concerning for viral meningitis.  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  Continue Acyclovir started in ER, due to continued headache and immune compromised status would anticipate completing total 14 days of Acyclovir which would be on 1/15.   Herpes zoster ophthalmicus: Failed outpatient treatment on oral antiviral regimen.  Mild visual impairment, limited exam due to right periorbital swelling.  Discussed with ophthalmology who has seen in house on 1/2, continue with steroid eyedrops for treatment of possible viral iritis for 2 weeks as well as systemic antivirals for total 14 days which would be 1/15.  Ophthalmology would like to follow up in their office in about one month.   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  Patient was initiated on empiric antibiotics (which have been DC 1/3 since studies neg for bacterial meningitis) and antiviral treatment to continue until at least 1/10.  Will monitor for improvement with symptoms. Optimize pain management. Neurology management appreciated.   Severe headache: Due to viral meningitis: Patient has history of underlying migraine  headaches.  Neurology has started her on Lyrica but headaches are persistent.  --Continue alternating IV and PO Dilaudid PRN --Trial Fioricet --Per Neurology - suggest trying 1-2 L fluid bolus, Fioricet PRN, consider trying valproate 500 mg IV x 1 to see if that helps. Can also try 10 of Decadron IV or 500 mg of Solu-Medrol IV x 1  --1 L bolus LR ordered --IV Decadron 10 mg x 1 --Continue close monitoring   Hypertension: Patient is on amlodipine 2.5 at home, will resume at 5mg  PO daily since BP has been consistently in the 150s.  Blood pressure now improved.   Anxiety/depression: Patient is on trazodone, BuSpar and sertraline.        Advance Care Planning:   Code Status: Full Code    Consults: Neurology and ophthalmology consults.   Family Communication:  Husband at bedside on rounds 1/10. None present on rounds today.   Subjective:  Pt having ongoing severe headaches today.  She is having 10/10 pain with 30 minutes longer before PRN medication can be given.  Headaches are worse today, medication not lasting as long.    Physical Exam: Vitals:   08/10/22 0005 08/10/22 0501 08/10/22 0823 08/10/22 1128  BP: (!) 138/93 136/83 129/87 125/84  Pulse: 72 62 73 74  Resp: 18 20 16 16   Temp: 98.4 F (36.9 C) 98.1 F (36.7 C) 98.4 F (36.9 C) 98.4 F (36.9 C)  TempSrc: Oral  Oral Oral  SpO2: 96% 94% 97% 98%  Weight:      Height:        General exam: awake, alert, appears in pain HEENT: eyes closed, moist mucus membranes, hearing  grossly normal  Respiratory system: CTAB, no wheezes, rales or rhonchi, normal respiratory effort. Cardiovascular system: normal S1/S2, RRR, no JVD, murmurs, rubs, gallops, no pedal edema.   Gastrointestinal system: soft, NT, ND, no HSM felt, +bowel sounds. Central nervous system: A&O x4. no gross focal neurologic deficits, normal speech Extremities: moves all, no edema, normal tone Skin: dry, intact, normal temperature Psychiatry: normal mood,  congruent affect, judgement and insight appear normal  .  Data Reviewed: CBC    Component Value Date/Time   WBC 6.8 08/08/2022 0628   RBC 4.30 08/08/2022 0628   HGB 13.8 08/08/2022 0628   HGB 10.9 (L) 04/21/2013 1027   HCT 39.8 08/08/2022 0628   HCT 32.0 (L) 04/21/2013 1027   PLT 238 08/08/2022 0628   PLT 517 (H) 04/21/2013 1027   MCV 92.6 08/08/2022 0628   MCV 82 04/21/2013 1027   MCH 32.1 08/08/2022 0628   MCHC 34.7 08/08/2022 0628   RDW 12.9 08/08/2022 0628   RDW 14.2 04/21/2013 1027   LYMPHSABS 1.6 07/31/2022 2057   MONOABS 0.4 07/31/2022 2057   EOSABS 0.3 07/31/2022 2057   BASOSABS 0.0 07/31/2022 2057      Latest Ref Rng & Units 08/08/2022    6:28 AM 08/07/2022    5:25 AM 08/06/2022    6:50 AM  BMP  Glucose 70 - 99 mg/dL 80  94  85   BUN 6 - 20 mg/dL 7  7  7    Creatinine 0.44 - 1.00 mg/dL 0.83  1.00  1.00   Sodium 135 - 145 mmol/L 139  139  139   Potassium 3.5 - 5.1 mmol/L 3.5  3.5  3.7   Chloride 98 - 111 mmol/L 109  107  109   CO2 22 - 32 mmol/L 24  25  23    Calcium 8.9 - 10.3 mg/dL 8.6  8.3  8.3    MR Brain and Orbits 1/3: 1. Right periorbital soft tissue thickening and hyperenhancement, which appears decreased compared to the 07/31/2022 CT when accounting for differences in technique. 2. No acute intracranial process. 3. No traumatic or inflammatory finding in the orbits.  Time spent: 35 minutes  Author: Dr. Nicole Kindred, DO [facility] 08/10/22  2:19 PM   For on call review [email or web address].

## 2022-08-11 DIAGNOSIS — A879 Viral meningitis, unspecified: Secondary | ICD-10-CM | POA: Diagnosis not present

## 2022-08-11 LAB — CBC
HCT: 38 % (ref 36.0–46.0)
Hemoglobin: 13.1 g/dL (ref 12.0–15.0)
MCH: 32 pg (ref 26.0–34.0)
MCHC: 34.5 g/dL (ref 30.0–36.0)
MCV: 92.9 fL (ref 80.0–100.0)
Platelets: 228 10*3/uL (ref 150–400)
RBC: 4.09 MIL/uL (ref 3.87–5.11)
RDW: 12.9 % (ref 11.5–15.5)
WBC: 5.5 10*3/uL (ref 4.0–10.5)
nRBC: 0 % (ref 0.0–0.2)

## 2022-08-11 LAB — BASIC METABOLIC PANEL
Anion gap: 8 (ref 5–15)
BUN: 8 mg/dL (ref 6–20)
CO2: 22 mmol/L (ref 22–32)
Calcium: 8.6 mg/dL — ABNORMAL LOW (ref 8.9–10.3)
Chloride: 110 mmol/L (ref 98–111)
Creatinine, Ser: 0.78 mg/dL (ref 0.44–1.00)
GFR, Estimated: >60 mL/min (ref >60)
Glucose, Bld: 114 mg/dL — ABNORMAL HIGH (ref 70–99)
Potassium: 3.4 mmol/L — ABNORMAL LOW (ref 3.5–5.1)
Sodium: 140 mmol/L (ref 135–145)

## 2022-08-11 MED ORDER — LORAZEPAM 0.5 MG PO TABS
0.5000 mg | ORAL_TABLET | Freq: Two times a day (BID) | ORAL | Status: DC | PRN
  Administered 2022-08-11 – 2022-08-14 (×6): 0.5 mg via ORAL
  Filled 2022-08-11 (×6): qty 1

## 2022-08-11 MED ORDER — HYDROMORPHONE HCL 2 MG PO TABS
4.0000 mg | ORAL_TABLET | ORAL | Status: DC | PRN
  Administered 2022-08-11 – 2022-08-14 (×15): 4 mg via ORAL
  Filled 2022-08-11 (×14): qty 2

## 2022-08-11 MED ORDER — POTASSIUM CHLORIDE CRYS ER 20 MEQ PO TBCR
40.0000 meq | EXTENDED_RELEASE_TABLET | Freq: Once | ORAL | Status: AC
  Administered 2022-08-11: 40 meq via ORAL
  Filled 2022-08-11: qty 2

## 2022-08-11 MED ORDER — BUTALBITAL-APAP-CAFFEINE 50-325-40 MG PO TABS
2.0000 | ORAL_TABLET | Freq: Three times a day (TID) | ORAL | Status: DC | PRN
  Administered 2022-08-11 – 2022-08-12 (×4): 2 via ORAL
  Filled 2022-08-11 (×4): qty 2

## 2022-08-11 MED ORDER — TRAZODONE HCL 50 MG PO TABS
50.0000 mg | ORAL_TABLET | Freq: Every day | ORAL | Status: DC
  Administered 2022-08-11 – 2022-08-16 (×6): 50 mg via ORAL
  Filled 2022-08-11 (×6): qty 1

## 2022-08-11 NOTE — Progress Notes (Signed)
Pharmacy Antibiotic Note  Sara Chapman is a 47 y.o. 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  Pharmacy has been consulted for Acyclovir dosing.  Varicella Zoster Virus DNA CSF detected  HSV 1/2 PCR CSF negative.    Plan: Day 9 of acyclovir with adjusted dose. Per [place] policy acyclovir dosing recommended using TBW or AdjBW if TBW >140 IBW.  Continue acyclovir dose adjusted using AdjBW 10 mg/kg TID. Plan for 14 days of treatment. If planning to do IV acyclovir for 10 days. Recommend discharging the patient on valacyclovir 1000 mg TID to complete 14 days of treatment.   Continue fluids NS @ 75 ml/hr while on acyclovir.       Height: 4\' 10"  (147.3 cm) Weight: 65.3 kg (144 lb) IBW/kg (Calculated) : 40.9  Temp (24hrs), Avg:98.4 F (36.9 C), Min:98 F (36.7 C), Max:98.7 F (37.1 C)  Recent Labs  Lab 08/05/22 0552 08/06/22 0650 08/07/22 0525 08/08/22 0628 08/11/22 0627  WBC 6.0 6.6 6.0 6.8 5.5  CREATININE 0.92 1.00 1.00 0.83 0.78     Estimated Creatinine Clearance: 68.8 mL/min (by C-G formula based on SCr of 0.78 mg/dL).    Allergies  Allergen Reactions   Clonazepam Other (See Comments)    SUICIDAL THOUGHTS     Ibuprofen Other (See Comments)    stomach ulcers    Nsaids Other (See Comments)    GI ulcers   Tolmetin Other (See Comments)    GI ulcers    Pseudoephedrine Hcl Palpitations and Other (See Comments)        Tape Rash    Adhesive tape breaks down the skin and causes sores, use paper tape     Microbiology results: 1/1 BCx: NGTD 1/1 CSF Cx: NGTD  Thank you for allowing pharmacy to be a part of this patient's care.  Oswald Hillock, PharmD, BCPS 08/11/2022 8:21 AM

## 2022-08-11 NOTE — Progress Notes (Signed)
Progress Note   Patient: Sara Chapman ZOX:[id number] DOB: 07/02/1976 DOA: 07/31/2022     10 DOS: the patient was seen and examined on 08/11/2022   Brief hospital course: Sara Chapman is a 47 y.o. female with medical history significant of hypertension, migraine headaches, psoriatic arthritis on infliximab, anxiety and depression. She was admitted 07/31/22 with right facial shingles involving the right side of the face, forehead and R eye. She had some associated fever and confusion and so was admitted with empiric abx after LP. She has been seen by ophthalmology in house and was seen by neurology as well.   Assessment and Plan: 47 year old female who is being managed for herpes zoster ophthalmicus.  Presents with headache, confusion and fever.  Symptoms concerning for viral meningitis.  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  Continue Acyclovir started in ER, due to continued headache and immune compromised status would anticipate completing total 14 days of Acyclovir which would be on 1/15.   Herpes zoster ophthalmicus: Failed outpatient treatment on oral antiviral regimen.  Mild visual impairment, limited exam due to right periorbital swelling.  Discussed with ophthalmology who has seen in house on 1/2, continue with steroid eyedrops for treatment of possible viral iritis for 2 weeks as well as systemic antivirals for total 14 days which would be 1/15.  Ophthalmology would like to follow up in their office in about one month.   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  Patient was initiated on empiric antibiotics (which have been DC 1/3 since studies neg for bacterial meningitis) and antiviral treatment to continue until at least 1/10.  Will monitor for improvement with symptoms. Optimize pain management. Neurology management appreciated.   Severe headache: Persistent.   Due to viral meningitis: Patient has history of underlying  migraine headaches.   Neurology started Lyrica but headaches are persistent.  --Continue alternating IV and PO Dilaudid PRN, Fioricet --Per Neurology - suggest trying 1-2 L fluid bolus, Fioricet PRN, consider trying valproate 500 mg IV x 1 to see if that helps. Can also try 10 of Decadron IV or 500 mg of Solu-Medrol IV x 1.  NSAID's okay to use, can try headache cocktail or ibuprofen 400 mg TID PRN.  Compazine or Phenergan may help if concurrent nausea.  --1/11 given 1 L bolus LR and IV Decadron 10 mg x 1 --1/12 AM increase PO dilaudid dose and add PRN Fioricet 2 tablets. --Will try IV valproate this afternoon if above not adequate --Continue close monitoring --Follow up at [facility] after discharge   Hypertension: Patient is on amlodipine 2.5 at home, will resume at 5mg  PO daily since BP has been consistently in the 150s.  Blood pressure now improved.   Anxiety/depression: Patient is on trazodone, BuSpar and sertraline.    Insomnia - trazodone ordered      Advance Care Planning:   Code Status: Full Code    Consults: Neurology and ophthalmology consults.    Family Communication:  Husband at bedside on rounds 1/10. None present on rounds today.   Subjective:  Pt continues having severe headaches.  She notes some improvement after IV fluid bolus and IV steroid given yesterday, pain improved to about a 7/10, lowest she's had thus far.  Agreeable to try other suggestions from Neurology.     Physical Exam: Vitals:   08/10/22 2324 08/11/22 0419 08/11/22 0828 08/11/22 1322  BP: 127/77 130/77 131/81 117/62  Pulse: 92 75 77 89  Resp: 16 18 16 16   Temp: 98.7 F (37.1 C) 98.5 F (36.9 C) 99.8 F (37.7 C) 98.3 F (36.8 C)  TempSrc:  Oral Oral Oral  SpO2: 95% 96% 94% 97%  Weight:      Height:        General exam: awake, alert, appears in pain HEENT: covering eyes from light, squinting, moist mucus membranes, hearing grossly normal  Respiratory  system: on room air, normal respiratory effort. Cardiovascular system: RRR, no pedal edema.   Central nervous system: A&O x4. no gross focal neurologic deficits, normal speech Extremities: moves all, no edema, normal tone Skin: dry, intact, no rashes seen on visualized skin Psychiatry: normal mood, congruent affect, judgement and insight appear normal  .  Data Reviewed: CBC    Component Value Date/Time   WBC 5.5 08/11/2022 0627   RBC 4.09 08/11/2022 0627   HGB 13.1 08/11/2022 0627   HGB 10.9 (L) 04/21/2013 1027   HCT 38.0 08/11/2022 0627   HCT 32.0 (L) 04/21/2013 1027   PLT 228 08/11/2022 0627   PLT 517 (H) 04/21/2013 1027   MCV 92.9 08/11/2022 0627   MCV 82 04/21/2013 1027   MCH 32.0 08/11/2022 0627   MCHC 34.5 08/11/2022 0627   RDW 12.9 08/11/2022 0627   RDW 14.2 04/21/2013 1027   LYMPHSABS 1.6 07/31/2022 2057   MONOABS 0.4 07/31/2022 2057   EOSABS 0.3 07/31/2022 2057   BASOSABS 0.0 07/31/2022 2057      Latest Ref Rng & Units 08/11/2022    6:27 AM 08/08/2022    6:28 AM 08/07/2022    5:25 AM  BMP  Glucose 70 - 99 mg/dL 114  80  94   BUN 6 - 20 mg/dL 8  7  7    Creatinine 0.44 - 1.00 mg/dL 0.78  0.83  1.00   Sodium 135 - 145 mmol/L 140  139  139   Potassium 3.5 - 5.1 mmol/L 3.4  3.5  3.5   Chloride 98 - 111 mmol/L 110  109  107   CO2 22 - 32 mmol/L 22  24  25    Calcium 8.9 - 10.3 mg/dL 8.6  8.6  8.3    MR Brain and Orbits 1/3: 1. Right periorbital soft tissue thickening and hyperenhancement, which appears decreased compared to the 07/31/2022 CT when accounting for differences in technique. 2. No acute intracranial process. 3. No traumatic or inflammatory finding in the orbits.    Time spent: 45 minutes  Author: Dr. Nicole Kindred, DO [facility] 08/11/22  2:41 PM   For on call review [email or web address].

## 2022-08-12 DIAGNOSIS — R519 Headache, unspecified: Secondary | ICD-10-CM | POA: Diagnosis present

## 2022-08-12 DIAGNOSIS — A879 Viral meningitis, unspecified: Secondary | ICD-10-CM | POA: Diagnosis not present

## 2022-08-12 DIAGNOSIS — Z8669 Personal history of other diseases of the nervous system and sense organs: Secondary | ICD-10-CM

## 2022-08-12 MED ORDER — KETOROLAC TROMETHAMINE 30 MG/ML IJ SOLN
15.0000 mg | Freq: Four times a day (QID) | INTRAMUSCULAR | Status: DC | PRN
  Administered 2022-08-12: 15 mg via INTRAVENOUS
  Filled 2022-08-12: qty 1

## 2022-08-12 MED ORDER — HYDROMORPHONE HCL 1 MG/ML IJ SOLN
1.0000 mg | Freq: Once | INTRAMUSCULAR | Status: AC
  Administered 2022-08-12: 1 mg via INTRAVENOUS
  Filled 2022-08-12: qty 1

## 2022-08-12 NOTE — Progress Notes (Signed)
Progress Note   Patient: Sara Chapman VOZ:[id number] DOB: 1976/02/23 DOA: 07/31/2022     11 DOS: the patient was seen and examined on 08/12/2022   Brief hospital course: Sara Chapman is a 47 y.o. female with medical history significant of hypertension, migraine headaches, psoriatic arthritis on infliximab, anxiety and depression. She was admitted 07/31/22 with right facial shingles involving the right side of the face, forehead and R eye. She had some associated fever and confusion and so was admitted with empiric abx after LP. She has been seen by ophthalmology in house and was seen by neurology as well.   Assessment and Plan: 47 year old female who is being managed for herpes zoster ophthalmicus.  Presents with headache, confusion and fever.  Symptoms concerning for viral meningitis.  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  Continue Acyclovir started in ER, due to continued headache and immune compromised status would anticipate completing total 14 days of Acyclovir which would be on 1/15.   Herpes zoster ophthalmicus: Failed outpatient treatment on oral antiviral regimen.  Mild visual impairment, limited exam due to right periorbital swelling.  Discussed with ophthalmology who has seen in house on 1/2, continue with steroid eyedrops for treatment of possible viral iritis for 2 weeks as well as systemic antivirals for total 14 days which would be 1/15.  Ophthalmology would like to follow up in their office in about one month.   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  Patient was initiated on empiric antibiotics (which have been DC 1/3 since studies neg for bacterial meningitis) and antiviral treatment to continue until at least 1/10.  Will monitor for improvement with symptoms. Optimize pain management. Neurology management appreciated.   Severe headache: Persistent.   Due to viral meningitis: Patient has history of underlying  migraine headaches.   Neurology started Lyrica but headaches are persistent.  --Continue alternating IV and PO Dilaudid PRN, Fioricet --Per Neurology - suggest trying 1-2 L fluid bolus, Fioricet PRN, consider trying valproate 500 mg IV x 1 to see if that helps. Can also try 10 of Decadron IV or 500 mg of Solu-Medrol IV x 1.  NSAID's okay to use, can try headache cocktail or ibuprofen 400 mg TID PRN.  Compazine or Phenergan may help if concurrent nausea.  --1/11 given 1 L bolus LR and IV Decadron 10 mg x 1 --1/12 AM increase PO dilaudid dose and add PRN Fioricet 2 tablets. --1/13 Improved with yesterday's changes.   --Add IV Toradol PRN, try to limit IV dilaudid --Next 2-3 days adjust PO meds and try weaning down need for IV meds --Continue close monitoring --Follow up at [facility] after discharge   Hypertension: Patient is on amlodipine 2.5 at home, will resume at 5mg  PO daily since BP has been consistently in the 150s.  Blood pressure now improved.   Anxiety/depression: Patient is on trazodone, BuSpar and sertraline.    Insomnia - trazodone ordered      Advance Care Planning:   Code Status: Full Code    Consults: Neurology and ophthalmology consults.    Family Communication:  Husband at bedside on rounds 1/10. None present on rounds today.   Subjective:  Pt reports mild improvement in headaches, pain 6 out of 10, best since admission now 12 days ago. She was finally able to sleep last night & thinks that's helped also. Headaches are persistent but seem slowly improving with medications.  Agreeable to adding Toradol and other suggestions from neurology if needed.     Physical Exam: Vitals:   08/11/22 1657 08/11/22 1951 08/11/22 2329 08/12/22 0416  BP: 115/76 (!) 150/96 101/62 (!) 131/90  Pulse: 85 80 79 70  Resp: 16 17 16 18   Temp: 98.4 F (36.9 C) 98.7 F (37.1 C) 98.4 F (36.9 C) 98.7 F (37.1 C)  TempSrc: Oral Oral Oral   SpO2:  98% 94% 94% 97%  Weight:      Height:        General exam: awake, alert, appears in pain HEENT: cmoist mucus membranes, hearing grossly normal  Respiratory system: on room air, normal respiratory effort. Cardiovascular system: RRR, no pedal edema.   Central nervous system: A&O x4. no gross focal neurologic deficits, normal speech Extremities: moves all, no edema, normal tone Skin: dry, intact, no rashes seen on visualized skin Psychiatry: normal mood, congruent affect, judgement and insight appear normal  .  Data Reviewed: CBC    Component Value Date/Time   WBC 5.5 08/11/2022 0627   RBC 4.09 08/11/2022 0627   HGB 13.1 08/11/2022 0627   HGB 10.9 (L) 04/21/2013 1027   HCT 38.0 08/11/2022 0627   HCT 32.0 (L) 04/21/2013 1027   PLT 228 08/11/2022 0627   PLT 517 (H) 04/21/2013 1027   MCV 92.9 08/11/2022 0627   MCV 82 04/21/2013 1027   MCH 32.0 08/11/2022 0627   MCHC 34.5 08/11/2022 0627   RDW 12.9 08/11/2022 0627   RDW 14.2 04/21/2013 1027   LYMPHSABS 1.6 07/31/2022 2057   MONOABS 0.4 07/31/2022 2057   EOSABS 0.3 07/31/2022 2057   BASOSABS 0.0 07/31/2022 2057      Latest Ref Rng & Units 08/11/2022    6:27 AM 08/08/2022    6:28 AM 08/07/2022    5:25 AM  BMP  Glucose 70 - 99 mg/dL 114  80  94   BUN 6 - 20 mg/dL 8  7  7    Creatinine 0.44 - 1.00 mg/dL 0.78  0.83  1.00   Sodium 135 - 145 mmol/L 140  139  139   Potassium 3.5 - 5.1 mmol/L 3.4  3.5  3.5   Chloride 98 - 111 mmol/L 110  109  107   CO2 22 - 32 mmol/L 22  24  25    Calcium 8.9 - 10.3 mg/dL 8.6  8.6  8.3    MR Brain and Orbits 1/3: 1. Right periorbital soft tissue thickening and hyperenhancement, which appears decreased compared to the 07/31/2022 CT when accounting for differences in technique. 2. No acute intracranial process. 3. No traumatic or inflammatory finding in the orbits.    Time spent: 45 minutes  Author: Dr. Nicole Kindred, DO [facility] 08/12/22  1:32 PM   For on call review  [email or web address].

## 2022-08-13 DIAGNOSIS — A879 Viral meningitis, unspecified: Secondary | ICD-10-CM | POA: Diagnosis not present

## 2022-08-13 MED ORDER — FENTANYL 12 MCG/HR TD PT72
1.0000 | MEDICATED_PATCH | TRANSDERMAL | Status: AC
  Administered 2022-08-13: 1 via TRANSDERMAL
  Filled 2022-08-13: qty 1

## 2022-08-13 MED ORDER — LACTATED RINGERS IV BOLUS
1000.0000 mL | Freq: Once | INTRAVENOUS | Status: AC
  Administered 2022-08-13: 1000 mL via INTRAVENOUS

## 2022-08-13 MED ORDER — PROMETHAZINE HCL 25 MG PO TABS: 25.0000 mg | ORAL_TABLET | Freq: Four times a day (QID) | ORAL | Status: DC | PRN

## 2022-08-13 MED ORDER — BUTALBITAL-APAP-CAFFEINE 50-325-40 MG PO TABS
2.0000 | ORAL_TABLET | Freq: Four times a day (QID) | ORAL | Status: DC | PRN
  Administered 2022-08-13: 2 via ORAL
  Filled 2022-08-13: qty 2

## 2022-08-13 MED ORDER — VALPROATE SODIUM 100 MG/ML IV SOLN
500.0000 mg | Freq: Once | INTRAVENOUS | Status: AC
  Administered 2022-08-13: 500 mg via INTRAVENOUS
  Filled 2022-08-13: qty 5

## 2022-08-13 MED ORDER — PREGABALIN 50 MG PO CAPS
100.0000 mg | ORAL_CAPSULE | Freq: Two times a day (BID) | ORAL | Status: DC
  Administered 2022-08-13: 100 mg via ORAL
  Filled 2022-08-13: qty 2

## 2022-08-13 NOTE — Progress Notes (Signed)
CROSS COVER NOTE  NAME: Sara Chapman MRN: [id number] DOB : Sep 15, 1975    HPI/Events of Note   Patient with continued pain 8-10/10 and multiple IV site infiltrations   Assessment and  Interventions   Assessment: Needs continuous pain management source that can be done outpatient as well Plan: Fentanyl patch 64mcg per hour Prn oral dilaudid as previously ordered Discontinued IV dilaudid       Kathlene Cote NP [facility]

## 2022-08-13 NOTE — Plan of Care (Addendum)
Pt with VZV meningitis with unremitting headaches. Dr Quinn Axe has seen pt in consult and I d/w Dr Arbutus Ped once before this week with recs on meds for headache. But she continues to have 10/10 headache. Earlier ascribed to the meningitis process, her OP was 42. She describes certain positional features c/f IIHT. She will need a IR guided LP with measurement of OP and if high, she will need high volume CSF removed to closing pressure of less than 25 if not 20 cm H2o. Unfortunately at this time I am not in house for consults, and she will be seen tomorrow by the oncoming neurologist. D/W Dr Arbutus Ped over secure chat.  -- Amie Portland, MD Neurologist [facility] Pager: [phone]

## 2022-08-13 NOTE — Progress Notes (Signed)
Progress Note   Patient: Sara Chapman [id number]:[id number] DOB: 08-19-1975 DOA: 07/31/2022     12 DOS: the patient was seen and examined on 08/13/2022   Brief hospital course: Sara Chapman is a 47 y.o. female with medical history significant of hypertension, migraine headaches, psoriatic arthritis on infliximab, anxiety and depression. She was admitted 07/31/22 with right facial shingles involving the right side of the face, forehead and R eye. She had some associated fever and confusion and so was admitted with empiric abx after LP. She has been seen by ophthalmology in house and was seen by neurology as well.   Assessment and Plan: 47 year old female who is being managed for herpes zoster ophthalmicus.  Presents with headache, confusion and fever.  Symptoms concerning for viral meningitis.  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  Continue Acyclovir started in ER, due to continued headache and immune compromised status would anticipate completing total 14 days of Acyclovir which would be on 1/15.   Herpes zoster ophthalmicus: Failed outpatient treatment on oral antiviral regimen.  Mild visual impairment, limited exam due to right periorbital swelling.  Discussed with ophthalmology who has seen in house on 1/2, continue with steroid eyedrops for treatment of possible viral iritis for 2 weeks as well as systemic antivirals for total 14 days which would be 1/15.  Ophthalmology would like to follow up in their office in about one month.   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  Patient was initiated on empiric antibiotics (which have been DC 1/3 since studies neg for bacterial meningitis) and antiviral treatment to continue until at least 1/10.  Will monitor for improvement with symptoms. Optimize pain management. Neurology management appreciated.   Severe headache: Persistent.   Due to viral meningitis: Patient has history of underlying  migraine headaches.   Neurology started Lyrica but headaches are persistent.  --Continue alternating IV and PO Dilaudid PRN, Fioricet --Per Neurology - suggest trying 1-2 L fluid bolus, Fioricet PRN, consider trying valproate 500 mg IV x 1 to see if that helps. Can also try 10 of Decadron IV or 500 mg of Solu-Medrol IV x 1.  NSAID's okay to use, can try headache cocktail or ibuprofen 400 mg TID PRN.  Compazine or Phenergan may help if concurrent nausea.  --1/11 given 1 L bolus LR and IV Decadron 10 mg x 1 --1/12 AM increased PO dilaudid dose and add PRN Fioricet 2 tablets. --1/13 Improved with yesterday's changes.  Headache remains but 6/10, better. --1/14 -- off IV dilaudid (IV infiltrated); Fentanyl patch ordered overnight --DC IV Toradol due to side effects (family report pt got "loopy") --IV Valproic acid and LR fluid bolus today --Add PRN Phenergan given nausea --Increase Lyrica (per neuro's recommendation) --Continue close monitoring --Follow up at [facility] after discharge   Hypertension: Patient is on amlodipine 2.5 at home, will resume at 5mg  PO daily since BP has been consistently in the 150s.  Blood pressure now improved.   Anxiety/depression: Patient is on trazodone, BuSpar and sertraline.    Insomnia - trazodone ordered      Advance Care Planning:   Code Status: Full Code    Consults: Neurology and ophthalmology consults.    Family Communication:  Husband at bedside on rounds 1/10. None present on rounds today.   Subjective:  Pt reports still having severe headache, frustrated by lack of relief all this time.  Notes overall there has been  improvement, but concerned about duration this will last, not able to function, worried about going home this way. Reports poor PO intake, gets nauseated when she tries to eat, or even at the smell of food. Headaches remain very different from her typical migraines.   Physical Exam: Vitals:    08/12/22 2339 08/13/22 0506 08/13/22 0741 08/13/22 1219  BP: 96/64 138/75 122/75 114/78  Pulse: 66 67 67 84  Resp: 18 16 18 18   Temp: 98 F (36.7 C) 98.1 F (36.7 C) 97.9 F (36.6 C) 98 F (36.7 C)  TempSrc:      SpO2: 95% 95% 95% 93%  Weight:      Height:        General exam: awake, alert, appears in pain HEENT: moist mucus membranes, hearing grossly normal  Respiratory system: on room air, normal respiratory effort. Cardiovascular system: RRR, no pedal edema.   Central nervous system: A&O x4. no gross focal neurologic deficits, normal speech Extremities: moves all, no edema, normal tone Skin: dry, intact, no rashes seen on visualized skin Psychiatry: normal mood, congruent affect, judgement and insight appear normal  .  Data Reviewed: CBC    Component Value Date/Time   WBC 5.5 08/11/2022 0627   RBC 4.09 08/11/2022 0627   HGB 13.1 08/11/2022 0627   HGB 10.9 (L) 04/21/2013 1027   HCT 38.0 08/11/2022 0627   HCT 32.0 (L) 04/21/2013 1027   PLT 228 08/11/2022 0627   PLT 517 (H) 04/21/2013 1027   MCV 92.9 08/11/2022 0627   MCV 82 04/21/2013 1027   MCH 32.0 08/11/2022 0627   MCHC 34.5 08/11/2022 0627   RDW 12.9 08/11/2022 0627   RDW 14.2 04/21/2013 1027   LYMPHSABS 1.6 07/31/2022 2057   MONOABS 0.4 07/31/2022 2057   EOSABS 0.3 07/31/2022 2057   BASOSABS 0.0 07/31/2022 2057      Latest Ref Rng & Units 08/11/2022    6:27 AM 08/08/2022    6:28 AM 08/07/2022    5:25 AM  BMP  Glucose 70 - 99 mg/dL 114  80  94   BUN 6 - 20 mg/dL 8  7  7    Creatinine 0.44 - 1.00 mg/dL 0.78  0.83  1.00   Sodium 135 - 145 mmol/L 140  139  139   Potassium 3.5 - 5.1 mmol/L 3.4  3.5  3.5   Chloride 98 - 111 mmol/L 110  109  107   CO2 22 - 32 mmol/L 22  24  25    Calcium 8.9 - 10.3 mg/dL 8.6  8.6  8.3    MR Brain and Orbits 1/3: 1. Right periorbital soft tissue thickening and hyperenhancement, which appears decreased compared to the 07/31/2022 CT when accounting for differences in  technique. 2. No acute intracranial process. 3. No traumatic or inflammatory finding in the orbits.    Time spent: 40 minutes  Author: Dr. Nicole Kindred, DO [facility] 08/13/22  2:25 PM   For on call review [email or web address]

## 2022-08-14 ENCOUNTER — Inpatient Hospital Stay: Payer: BC Managed Care – PPO

## 2022-08-14 DIAGNOSIS — A879 Viral meningitis, unspecified: Secondary | ICD-10-CM | POA: Diagnosis not present

## 2022-08-14 LAB — BASIC METABOLIC PANEL
Anion gap: 8 (ref 5–15)
BUN: 5 mg/dL — ABNORMAL LOW (ref 6–20)
CO2: 24 mmol/L (ref 22–32)
Calcium: 7.9 mg/dL — ABNORMAL LOW (ref 8.9–10.3)
Chloride: 107 mmol/L (ref 98–111)
Creatinine, Ser: 0.94 mg/dL (ref 0.44–1.00)
GFR, Estimated: >60 mL/min (ref >60)
Glucose, Bld: 84 mg/dL (ref 70–99)
Potassium: 3.3 mmol/L — ABNORMAL LOW (ref 3.5–5.1)
Sodium: 139 mmol/L (ref 135–145)

## 2022-08-14 MED ORDER — KETOROLAC TROMETHAMINE 30 MG/ML IJ SOLN
15.0000 mg | Freq: Four times a day (QID) | INTRAMUSCULAR | Status: DC
  Administered 2022-08-14 – 2022-08-17 (×9): 15 mg via INTRAVENOUS
  Filled 2022-08-14 (×10): qty 1

## 2022-08-14 MED ORDER — KETOROLAC TROMETHAMINE 30 MG/ML IJ SOLN
30.0000 mg | Freq: Once | INTRAMUSCULAR | Status: AC
  Administered 2022-08-14: 30 mg via INTRAVENOUS
  Filled 2022-08-14: qty 1

## 2022-08-14 MED ORDER — PREGABALIN 75 MG PO CAPS
150.0000 mg | ORAL_CAPSULE | Freq: Two times a day (BID) | ORAL | Status: DC
  Administered 2022-08-14 – 2022-08-17 (×7): 150 mg via ORAL
  Filled 2022-08-14 (×7): qty 2

## 2022-08-14 MED ORDER — PROCHLORPERAZINE EDISYLATE 10 MG/2ML IJ SOLN
10.0000 mg | Freq: Four times a day (QID) | INTRAMUSCULAR | Status: AC
  Administered 2022-08-14 – 2022-08-16 (×7): 10 mg via INTRAVENOUS
  Filled 2022-08-14 (×10): qty 2

## 2022-08-14 MED ORDER — HYDROMORPHONE HCL 2 MG PO TABS
3.0000 mg | ORAL_TABLET | ORAL | Status: DC | PRN
  Administered 2022-08-14: 3 mg via ORAL
  Filled 2022-08-14: qty 2

## 2022-08-14 MED ORDER — POTASSIUM CHLORIDE CRYS ER 20 MEQ PO TBCR
40.0000 meq | EXTENDED_RELEASE_TABLET | ORAL | Status: AC
  Administered 2022-08-14 (×2): 40 meq via ORAL
  Filled 2022-08-14 (×2): qty 2

## 2022-08-14 MED ORDER — VALPROATE SODIUM 100 MG/ML IV SOLN
1000.0000 mg | Freq: Once | INTRAVENOUS | Status: AC
  Administered 2022-08-14: 1000 mg via INTRAVENOUS
  Filled 2022-08-14: qty 10

## 2022-08-14 MED ORDER — DIPHENHYDRAMINE HCL 50 MG/ML IJ SOLN
25.0000 mg | Freq: Four times a day (QID) | INTRAMUSCULAR | Status: AC
  Administered 2022-08-14 – 2022-08-16 (×7): 25 mg via INTRAVENOUS
  Filled 2022-08-14 (×9): qty 1

## 2022-08-14 MED ORDER — MAGNESIUM SULFATE 2 GM/50ML IV SOLN
2.0000 g | Freq: Once | INTRAVENOUS | Status: AC
  Administered 2022-08-14: 2 g via INTRAVENOUS
  Filled 2022-08-14: qty 50

## 2022-08-14 NOTE — Progress Notes (Signed)
Neurology Progress Note  Patient ID: Sara Chapman is a 47 y.o. with PMHx of hypertension, psoriasis on infliximab, migraine headaches, anxiety/depression, gastric sleeve, recent diagnosis of VZV meningitis and right facial shingles  Subjective: She reports her headache actually started on December 23 with intermittent nagging pain relieved by 2 Excedrin Migraine taken nearly daily initially until the 27th when the headache changed character for which she presented to urgent care hoping for a migraine cocktail.  However due to her history she was referred to the ED for further evaluation.  She visited multiple ED's due to long wait times and 1 was diagnosed with a bacterial infection of the eye but also given valacyclovir for this, which she started.  Her headache continued to worsen and then she developed a rash on the right eye for which she presented on January 1.  Due to the worsening headache on January 2 she returned and lumbar puncture revealed VZV meningitis for which she has been treated with IV valacyclovir with improvement in her rash.  However her headache remains persistent  She describes it as a stabbing pain side locked to the right side with associated light sensitivity, sound sensitivity, nausea, no vomiting but severely reduced appetite, has waxed and waned in magnitude but has been persistent, no clear since that any particular medication has been more helpful than the other.  She has been receiving Lyrica 75 mg twice daily, increased to 100 mg last night and now 150 mg twice daily today by primary team.  In addition she received Depakote 500 mg 1 dose yesterday, was restarted on Fioricet on 1/12, and has been getting opiates very regularly (Dilaudid initially IV 1 mg every 4 hours and transitioned to p.o. initially 4 mg now 3 mg).    She additionally received ketorolac 15 mg 1:30 PM on 1/13 and family noted she was very delirious that day, talking about driving a lawnmower down  [address], and they were concerned she may have had a reaction to this medication in the past and did not want her to receive more of this medication.  In clarification with the patient she had perhaps been overusing it in the past for pain and was told to slow down to avoid stomach ulcers, and she was told to not take it in the 1 or 2 months immediately after her gastric sleeve which was completed last June.  She has not had any stomach ulcers (just a colon perforation in Feb 2021) due to perforated abscess.  Family (sister, Anderson Malta) and patient feel comfortable with an additional trial of ketorolac today   Exam: Current vital signs: BP 110/84 (BP Location: Left Arm)   Pulse 74   Temp 99.2 F (37.3 C) (Oral)   Resp 16   Ht 4\' 10"  (1.473 m)   Wt 65.3 kg   SpO2 96%   BMI 30.10 kg/m  Vital signs in last 24 hours: Temp:  [98 F (36.7 C)-99.2 F (37.3 C)] 99.2 F (37.3 C) (01/15 0828) Pulse Rate:  [69-74] 74 (01/15 0828) Resp:  [16-18] 16 (01/15 0828) BP: (110-121)/(77-98) 110/84 (01/15 0828) SpO2:  [93 %-97 %] 96 % (01/15 0828)   Gen: In bed, uncomfortable appearing, clutching the right side of her face Head Resp: non-labored breathing, no grossly audible wheezing Cardiac: Perfusing extremities well  Abd: soft, nt  Neuro: MS: Awake, alert, fluent speech, following all commands, oriented to person, place, situation CN: Pupils equal round reactive to light, EOMI but hesitant to look to  the right due to worsening pain, face symmetric, tongue midline, 5/5 shoulder shrug bilaterally, see sensory below Motor: No pronator drift.  5/5 hip flexor strength bilaterally Sensory: Intact to light touch throughout except for slightly reduced in the right V1/paresthesias in the right V1 distribution Coordination: Intact finger-to-nose and heel-to-shin bilaterally DTR: 3+ and symmetric brachioradialis and patellae  Pertinent Labs:  Basic Metabolic Panel: Recent Labs  Lab 08/08/22 0628  08/11/22 0627 08/14/22 0300  NA 139 140 139  K 3.5 3.4* 3.3*  CL 109 110 107  CO2 24 22 24   GLUCOSE 80 114* 84  BUN 7 8 5*  CREATININE 0.83 0.78 0.94  CALCIUM 8.6* 8.6* 7.9*    CBC: Recent Labs  Lab 08/08/22 0628 08/11/22 0627  WBC 6.8 5.5  HGB 13.8 13.1  HCT 39.8 38.0  MCV 92.6 92.9  PLT 238 228    Coagulation Studies: No results for input(s): "LABPROT", "INR" in the last 72 hours.    Impression: Most likely persistent severe migraine headache in the setting of recent well treated VZV meningitis.  I do not suspect a significant low pressure component at this time as it is mainly with position change that she feels headache severity increase, and the rest of her neurological examination remains reassuring.  Her headache actually improves a bit while she is sitting upright once she has stopped moving on my evaluation today.  There may be a component of medication overuse headache and given she has been on substantial doses of opiates for 2 weeks now as well as intermittent Fioricet. Lumbar puncture report is pending but patient reports opening pressure was 18, reassuring against any significant low or high pressure component to her headache  Recommendations: -Ketorolac 30 mg once followed by 15 mg every 6 hours x 12 doses, can be discontinued if she becomes delirious again -Benadryl 25 mg every 6 hours -Compazine 10 mg every 6 hours -Magnesium 2 mg IV once, with magnesium level check in the morning -Depakote 1000 mg x 1 dose -Discontinue firocet, no changes to opiates for now -Neurology will continue to follow    [place] [phone]   Greater than 50 minutes spent in care of this patient today, greater than 50% at bedside

## 2022-08-14 NOTE — Progress Notes (Signed)
Progress Note   Patient: Sara Chapman DOB: 1976-04-11 DOA: 07/31/2022     13 DOS: the patient was seen and examined on 08/14/2022   Brief hospital course: Sara Chapman is a 47 y.o. female with medical history significant of hypertension, migraine headaches, psoriatic arthritis on infliximab, anxiety and depression. She was admitted 07/31/22 with right facial shingles involving the right side of the face, forehead and R eye. She had some associated fever and confusion and so was admitted with empiric abx after LP. She has been seen by ophthalmology in house and was seen by neurology as well.   Assessment and Plan: 47 year old female who is being managed for herpes zoster ophthalmicus.  Presents with headache, confusion and fever.  Symptoms concerning for viral meningitis.  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  Continue Acyclovir started in ER, due to continued headache and immune compromised status would anticipate completing total 14 days of Acyclovir which would be on 1/15.   Herpes zoster ophthalmicus: Failed outpatient treatment on oral antiviral regimen.  Mild visual impairment, limited exam due to right periorbital swelling.  Discussed with ophthalmology who has seen in house on 1/2, continue with steroid eyedrops for treatment of possible viral iritis for 2 weeks as well as systemic antivirals for total 14 days which would be 1/15.  Ophthalmology would like to follow up in their office in about one month.   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  Patient was initiated on empiric antibiotics (which have been DC 1/3 since studies neg for bacterial meningitis) and antiviral treatment to continue until at least 1/10.  Will monitor for improvement with symptoms. Optimize pain management. Neurology management appreciated.   Severe headache: Persistent.   Due to viral meningitis: Patient has history of underlying  migraine headaches.   Neurology started Lyrica but headaches have been persistent.  No relief with combination of Lyrica, Fioricet, Dilaudid (PO and IV), IV steroid, IV valproate Suspect medication overuse headache may be contributing. Ruled out IIHTN by repeat LP with normal opening pressure on 1/15 --Continue alternating IV and PO Dilaudid PRN, Fioricet --Per Neurology - suggest trying 1-2 L fluid bolus, Fioricet PRN, consider trying valproate 500 mg IV x 1 to see if that helps. Can also try 10 of Decadron IV or 500 mg of Solu-Medrol IV x 1.  NSAID's okay to use, can try headache cocktail or ibuprofen 400 mg TID PRN.  Compazine or Phenergan may help if concurrent nausea.  --1/11 given 1 L bolus LR and IV Decadron 10 mg x 1 --1/12 AM increased PO dilaudid dose and add PRN Fioricet 2 tablets. --1/13 Improved with yesterday's changes.  Headache remains but 6/10, better. --1/14 -- off IV dilaudid (IV infiltrated); Fentanyl patch ordered overnight --DC IV Toradol due to side effects (family report pt got "loopy") --1/14 - given IV Valproic acid and LR fluid bolus - no change in HA --Added PRN Phenergan given nausea --Increased Lyrica (per neuro's recommendation) --1/15 -- reduce PO dilaudid dose 4 to 3 mg --See updated orders & Neurology recommendations (migraine cocktail, IV Mg, stop Fioricet) --Continue close monitoring --Follow up at [facility] after discharge --Neurology to continue following   Hypertension: Patient is on amlodipine 2.5 at home, will resume at 5mg  PO daily since BP has been consistently in the 150s.  Blood pressure now improved.   Anxiety/depression: Patient is on trazodone, BuSpar and sertraline.  Insomnia - trazodone ordered      Advance Care Planning:   Code Status: Full Code    Consults: Neurology and ophthalmology consults.    Family Communication:  Husband at bedside on rounds 1/10. None present on rounds  today.   Subjective:  Pt has persistent headache with no changes.  Underwent repeat LP today, no elevated opening pressure this time.  No other acute complaints.     Physical Exam: Vitals:   08/13/22 2010 08/13/22 2347 08/14/22 0328 08/14/22 0828  BP: (!) 116/98 116/78 121/85 110/84  Pulse: 71 69 71 74  Resp: 18 16 16 16   Temp: 98.5 F (36.9 C) 98.5 F (36.9 C) 98 F (36.7 C) 99.2 F (37.3 C)  TempSrc:   Oral Oral  SpO2: 97% 93% 95% 96%  Weight:      Height:        General exam: awake, alert, no acute distress but appears in pain HEENT: squinting eyes, moist mucus membranes, hearing grossly normal  Respiratory system: on room air, normal respiratory effort. Cardiovascular system: RRR, no pedal edema.   Central nervous system: A&O x4. no gross focal neurologic deficits, normal speech Extremities: moves all, no edema, normal tone Skin: dry, intact, no rashes seen on visualized skin Psychiatry: normal mood, congruent affect, judgement and insight appear normal  .  Data Reviewed: CBC    Component Value Date/Time   WBC 5.5 08/11/2022 0627   RBC 4.09 08/11/2022 0627   HGB 13.1 08/11/2022 0627   HGB 10.9 (L) 04/21/2013 1027   HCT 38.0 08/11/2022 0627   HCT 32.0 (L) 04/21/2013 1027   PLT 228 08/11/2022 0627   PLT 517 (H) 04/21/2013 1027   MCV 92.9 08/11/2022 0627   MCV 82 04/21/2013 1027   MCH 32.0 08/11/2022 0627   MCHC 34.5 08/11/2022 0627   RDW 12.9 08/11/2022 0627   RDW 14.2 04/21/2013 1027   LYMPHSABS 1.6 07/31/2022 2057   MONOABS 0.4 07/31/2022 2057   EOSABS 0.3 07/31/2022 2057   BASOSABS 0.0 07/31/2022 2057      Latest Ref Rng & Units 08/14/2022    3:00 AM 08/11/2022    6:27 AM 08/08/2022    6:28 AM  BMP  Glucose 70 - 99 mg/dL 84  114  80   BUN 6 - 20 mg/dL 5  8  7    Creatinine 0.44 - 1.00 mg/dL 0.94  0.78  0.83   Sodium 135 - 145 mmol/L 139  140  139   Potassium 3.5 - 5.1 mmol/L 3.3  3.4  3.5   Chloride 98 - 111 mmol/L 107  110  109   CO2 22 - 32  mmol/L 24  22  24    Calcium 8.9 - 10.3 mg/dL 7.9  8.6  8.6    MR Brain and Orbits 1/3: 1. Right periorbital soft tissue thickening and hyperenhancement, which appears decreased compared to the 07/31/2022 CT when accounting for differences in technique. 2. No acute intracranial process. 3. No traumatic or inflammatory finding in the orbits.    Time spent: 40 minutes  Author: Dr. Nicole Kindred, DO [facility] 08/14/22  12:26 PM   For on call review [email or web address].

## 2022-08-14 NOTE — Progress Notes (Signed)
Pharmacy Antibiotic Note  Sara Chapman is a 47 y.o. 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  Pharmacy has been consulted for Acyclovir dosing.  Varicella Zoster Virus DNA CSF detected  HSV 1/2 PCR CSF negative.   Plan: Day 12 of acyclovir with adjusted dose. Per [place] policy acyclovir dosing recommended using TBW or AdjBW if TBW >140 IBW.  Continue acyclovir dose adjusted using AdjBW 10 mg/kg TID. Plan for 14 days of treatment. If planning to do IV acyclovir for 10 days. Recommend discharging the patient on valacyclovir 1000 mg TID to complete 14 days of treatment.   Continue fluids NS @ 75 ml/hr while on acyclovir.       Height: 4\' 10"  (147.3 cm) Weight: 65.3 kg (144 lb) IBW/kg (Calculated) : 40.9  Temp (24hrs), Avg:98.4 F (36.9 C), Min:98 F (36.7 C), Max:99.2 F (37.3 C)  Recent Labs  Lab 08/08/22 0628 08/11/22 0627 08/14/22 0300  WBC 6.8 5.5  --   CREATININE 0.83 0.78 0.94     Estimated Creatinine Clearance: 58.6 mL/min (by C-G formula based on SCr of 0.94 mg/dL).    Allergies  Allergen Reactions   Clonazepam Other (See Comments)    SUICIDAL THOUGHTS     Ibuprofen Other (See Comments)    stomach ulcers    Nsaids Other (See Comments)    GI ulcers   Tolmetin Other (See Comments)    GI ulcers    Toradol [Ketorolac Tromethamine] Other (See Comments)    Confusion   Pseudoephedrine Hcl Palpitations and Other (See Comments)        Tape Rash    Adhesive tape breaks down the skin and causes sores, use paper tape    Thank you for allowing pharmacy to be a part of this patient's care.  Shardai Star Rodriguez-Guzman PharmD, BCPS 08/14/2022 8:55 AM

## 2022-08-15 DIAGNOSIS — A879 Viral meningitis, unspecified: Secondary | ICD-10-CM | POA: Diagnosis not present

## 2022-08-15 LAB — MAGNESIUM: Magnesium: 2.3 mg/dL (ref 1.7–2.4)

## 2022-08-15 MED ORDER — ACETAMINOPHEN 325 MG PO TABS
650.0000 mg | ORAL_TABLET | Freq: Four times a day (QID) | ORAL | Status: DC | PRN
  Administered 2022-08-16 – 2022-08-17 (×3): 650 mg via ORAL
  Filled 2022-08-15 (×4): qty 2

## 2022-08-15 MED ORDER — GUAIFENESIN-DM 100-10 MG/5ML PO SYRP
5.0000 mL | ORAL_SOLUTION | ORAL | Status: DC | PRN
  Administered 2022-08-16 – 2022-08-17 (×3): 5 mL via ORAL
  Filled 2022-08-15 (×3): qty 10

## 2022-08-15 MED ORDER — HYDROMORPHONE HCL 2 MG PO TABS: 2.0000 mg | ORAL_TABLET | ORAL | Status: DC | PRN

## 2022-08-15 MED ORDER — MAGNESIUM SULFATE 2 GM/50ML IV SOLN
2.0000 g | Freq: Once | INTRAVENOUS | Status: AC
  Administered 2022-08-15: 2 g via INTRAVENOUS
  Filled 2022-08-15: qty 50

## 2022-08-15 MED ORDER — VALPROATE SODIUM 100 MG/ML IV SOLN
1000.0000 mg | Freq: Once | INTRAVENOUS | Status: AC
  Administered 2022-08-15: 1000 mg via INTRAVENOUS
  Filled 2022-08-15: qty 10

## 2022-08-15 MED ORDER — VERAPAMIL HCL ER 120 MG PO TBCR
120.0000 mg | EXTENDED_RELEASE_TABLET | Freq: Every day | ORAL | Status: DC
  Administered 2022-08-15 – 2022-08-17 (×3): 120 mg via ORAL
  Filled 2022-08-15 (×4): qty 1

## 2022-08-15 MED ORDER — SUMATRIPTAN SUCCINATE 50 MG PO TABS
50.0000 mg | ORAL_TABLET | Freq: Two times a day (BID) | ORAL | Status: DC
  Administered 2022-08-15 – 2022-08-17 (×5): 50 mg via ORAL
  Filled 2022-08-15 (×7): qty 1

## 2022-08-15 NOTE — Progress Notes (Signed)
Progress Note   Patient: Sara Chapman ZOX:[id number] DOB: July 01, 1976 DOA: 07/31/2022     14 DOS: the patient was seen and examined on 08/15/2022   Brief hospital course: Sara Chapman is a 47 y.o. female with medical history significant of hypertension, migraine headaches, psoriatic arthritis on infliximab, anxiety and depression. She was admitted 07/31/22 with right facial shingles involving the right side of the face, forehead and R eye. She had some associated fever and confusion and so was admitted with empiric abx after LP.   Hospital course complicated by severe persistent headaches requiring multiple medications. Finally getting some improvement with recent changes outlined below. Neurology resumed following 1/15.   Assessment and Plan: 47 year old female who is being managed for herpes zoster ophthalmicus.  Presents with headache, confusion and fever.  Symptoms concerning for viral meningitis.  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     Herpes zoster ophthalmicus: Failed outpatient treatment on oral antiviral regimen.  Mild visual impairment, limited exam due to right periorbital swelling.  Discussed with ophthalmology who has seen in house on 1/2, continue with steroid eyedrops for treatment of possible viral iritis for 2 weeks as well as systemic antivirals for total 14 days which would be 1/15.  Ophthalmology would like to follow up in their office in about one month.   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  Patient was initiated on empiric antibiotics (which have been DC 1/3 since studies neg for bacterial meningitis)  --Continue IV acyclovir through 1/18 --Maintenance fluids while on acyclovir --Neurology following   Severe headache - due to meninginitis and history of frequent Migraines: Persistent.   Neurology initially started Lyrica but headaches have been persistent.  No relief with combination of  Lyrica, Fioricet, Dilaudid (PO and IV), IV steroid, IV valproate Suspect medication overuse headache may be contributing, and underlying migraines uncontrolled. Ruled out IIHTN by repeat LP with normal opening pressure on 1/15. --Neurology following again, given severity and persistence of headaches --1/11 given 1 L bolus LR and IV Decadron 10 mg x 1 --1/12 AM increased PO dilaudid dose and add PRN Fioricet 2 tablets. --1/13 Improved with yesterday's changes.  Headache remains but 6/10, better. --1/14 -- off IV dilaudid (IV infiltrated); Fentanyl patch ordered overnight  (will not re-order but okay to keep for now) --1/14 - given IV Valproic acid and LR fluid bolus - no change in HA --Added PRN Phenergan given nausea --Increased Lyrica (per neuro's recommendation) --1/15 -- reduce PO dilaudid dose 4 to 3 mg --1/16 -- reduce PO dilaudid to 2 mg (she only used once yesterday)  --See updated orders & Neurology recommendations  (migraine cocktails, IV Mg, stop Fioricet, add Imitrex, repeating Valproate IV today) --Continue weaning opiates --Follow up at [facility] after discharge --Neurology to continue following, recommendations appreciated --Ophthalmology follow up outpatient   Hypertension: Patient is on amlodipine 2.5 at home, will resume at 5mg  PO daily since BP has been consistently in the 150s.  Blood pressure now improved.   Anxiety/depression: Patient is on trazodone, BuSpar and sertraline.    Insomnia - trazodone ordered      Advance Care Planning:   Code Status: Full Code    Consults: Neurology and ophthalmology consults.    Family Communication:  Sister updated by phone 1/15, 1/16   Subjective:  Pt feels "woozy" and not herself when seen this AM.  She reports a new cough has developed.  Denies sore throat, congestion, fever/chills.  Headache improved with IV cocktails but she feels tired and "off"... possibly related to benadryl in the  cocktail.  No other acute complaints.      Physical Exam: Vitals:   08/14/22 2323 08/15/22 0400 08/15/22 0826 08/15/22 1213  BP: 126/82 116/83 120/84 (!) 132/91  Pulse: 77 78 72 77  Resp: 20 18 18 20   Temp: 98.4 F (36.9 C) 98.8 F (37.1 C) 99.1 F (37.3 C) 99.6 F (37.6 C)  TempSrc: Oral Oral Oral Oral  SpO2: 94% 96% 97% 92%  Weight:      Height:        General exam: awake, very drowsy, no acute distress HEENT: squinting eyes, moist mucus membranes, hearing grossly normal  Respiratory system: on room air, normal respiratory effort. CTAB no wheezes or rhonchi Cardiovascular system: RRR, no pedal edema.   Central nervous system: A&O x4. no gross focal neurologic deficits, normal speech Extremities: moves all, no edema, normal tone Skin: dry, intact, no rashes seen on visualized skin Psychiatry: normal mood, congruent affect, judgement and insight appear normal  .  Data Reviewed: CBC    Component Value Date/Time   WBC 5.5 08/11/2022 0627   RBC 4.09 08/11/2022 0627   HGB 13.1 08/11/2022 0627   HGB 10.9 (L) 04/21/2013 1027   HCT 38.0 08/11/2022 0627   HCT 32.0 (L) 04/21/2013 1027   PLT 228 08/11/2022 0627   PLT 517 (H) 04/21/2013 1027   MCV 92.9 08/11/2022 0627   MCV 82 04/21/2013 1027   MCH 32.0 08/11/2022 0627   MCHC 34.5 08/11/2022 0627   RDW 12.9 08/11/2022 0627   RDW 14.2 04/21/2013 1027   LYMPHSABS 1.6 07/31/2022 2057   MONOABS 0.4 07/31/2022 2057   EOSABS 0.3 07/31/2022 2057   BASOSABS 0.0 07/31/2022 2057      Latest Ref Rng & Units 08/14/2022    3:00 AM 08/11/2022    6:27 AM 08/08/2022    6:28 AM  BMP  Glucose 70 - 99 mg/dL 84  114  80   BUN 6 - 20 mg/dL 5  8  7    Creatinine 0.44 - 1.00 mg/dL 0.94  0.78  0.83   Sodium 135 - 145 mmol/L 139  140  139   Potassium 3.5 - 5.1 mmol/L 3.3  3.4  3.5   Chloride 98 - 111 mmol/L 107  110  109   CO2 22 - 32 mmol/L 24  22  24    Calcium 8.9 - 10.3 mg/dL 7.9  8.6  8.6    MR Brain and Orbits 1/3: 1. Right  periorbital soft tissue thickening and hyperenhancement, which appears decreased compared to the 07/31/2022 CT when accounting for differences in technique. 2. No acute intracranial process. 3. No traumatic or inflammatory finding in the orbits.    Time spent: 40 minutes  Author: Dr. Nicole Kindred, DO [facility] 08/15/22  2:17 PM   For on call review [email or web address].

## 2022-08-15 NOTE — Plan of Care (Signed)

## 2022-08-15 NOTE — Progress Notes (Signed)
Neurology Progress Note  Patient ID: Sara Chapman is a 47 y.o. with PMHx of hypertension, psoriasis on infliximab, migraine headaches, anxiety/depression, gastric sleeve, recent diagnosis of VZV meningitis and right facial shingles  She reports her headache actually started on December 23 with intermittent nagging pain relieved by 2 Excedrin Migraine taken nearly daily initially until the 27th when the headache changed character for which she presented to urgent care hoping for a migraine cocktail.  However due to her history she was referred to the ED for further evaluation.  She visited multiple ED's due to long wait times and 1 was diagnosed with a bacterial infection of the eye but also given valacyclovir for this, which she started.  Her headache continued to worsen and then she developed a rash on the right eye for which she presented on January 1.  Due to the worsening headache on January 2 she returned and lumbar puncture revealed VZV meningitis for which she has been treated with IV valacyclovir with improvement in her rash.  However her headache remains persistent  She describes it as a stabbing pain side locked to the right side with associated light sensitivity, sound sensitivity, nausea, no vomiting but severely reduced appetite, has waxed and waned in magnitude but has been persistent, no clear since that any particular medication has been more helpful than the other.  She has been receiving Lyrica 75 mg twice daily, increased to 100 mg 1/14 night and now 150 mg twice daily 1/15 by primary team.  In addition she received Depakote 500 mg 1 dose 1/14, was restarted on Fioricet on 1/12, and has been getting opiates very regularly (Dilaudid initially IV 1 mg every 4 hours and transitioned to p.o. initially 4 mg now 3 mg).    She additionally received ketorolac 15 mg 1:30 PM on 1/13 and family noted she was very delirious that day, however, family consented to re-trial of this  medication  Initial LP OP 35, repeat on 1/15 with OP of 18  1/15 -Ketorolac 30 mg once followed by 15 mg every 6 hours x 12 doses, can be discontinued if she becomes delirious again -Benadryl 25 mg every 6 hours -Compazine 10 mg every 6 hours -Magnesium 2 mg IV once, with magnesium level check in the morning (resulted 2.2) -Depakote 1000 mg x 1 dose -Discontinue firocet, no changes to opiates for now (but did not take any since 1/15 at 8 AM), ordered PRN  Subjective/interval: -Reports headache much improved but she has been very sleepy -patient and husband at bedside report no c/f delirium  Exam: Current vital signs: BP 120/84 (BP Location: Left Arm)   Pulse 72   Temp 99.1 F (37.3 C) (Oral)   Resp 18   Ht 4\' 10"  (1.473 m)   Wt 65.3 kg   SpO2 97%   BMI 30.10 kg/m  Vital signs in last 24 hours: Temp:  [97.9 F (36.6 C)-99.1 F (37.3 C)] 99.1 F (37.3 C) (01/16 0826) Pulse Rate:  [72-91] 72 (01/16 0826) Resp:  [14-20] 18 (01/16 0826) BP: (104-128)/(82-89) 120/84 (01/16 0826) SpO2:  [94 %-97 %] 97 % (01/16 0826)   Gen: In bed, sleepy, much more comfortable appearing Resp: non-labored breathing, no grossly audible wheezing Cardiac: Perfusing extremities well  Abd: soft, nt  Neuro: MS: Oriented to person/place/situation CN: face symmetric, tongue midline Motor: Using bilateral upper extremities equally to adjust blanket with smooth coordinated movements  Pertinent Labs:  Basic Metabolic Panel: Recent Labs  Lab 08/11/22 [phone]  08/14/22 0300 08/15/22 0440  NA 140 139  --   K 3.4* 3.3*  --   CL 110 107  --   CO2 22 24  --   GLUCOSE 114* 84  --   BUN 8 5*  --   CREATININE 0.78 0.94  --   CALCIUM 8.6* 7.9*  --   MG  --   --  2.3     CBC: Recent Labs  Lab 08/11/22 0627  WBC 5.5  HGB 13.1  HCT 38.0  MCV 92.9  PLT 228     Coagulation Studies: No results for input(s): "LABPROT", "INR" in the last 72 hours.    Impression: Most likely persistent  severe migraine headache in the setting of recent well treated VZV meningitis.  I do not suspect a significant low pressure component at this time as it is mainly with position change that she feels headache severity increase, and the rest of her neurological examination remains reassuring.  Her headache actually improves a bit while she is sitting upright once she has stopped moving on my evaluation today.  There may be a component of medication overuse headache and given she has been on substantial doses of opiates for 2 weeks now as well as intermittent Fioricet. Lumbar puncture report is pending but patient reports opening pressure was 18, reassuring against any significant low or high pressure component to her headache  Headache improving today   Recommendations: Continue scheduled migraine cocktails ordered 1/15 -Ketorolac 30 mg once followed by 15 mg every 6 hours x 12 doses, can be discontinued if she becomes delirious again -Benadryl 25 mg every 6 hours -Compazine 10 mg every 6 hours Additionally: -Magnesium 2 mg IV once again today -Depakote 1000 mg x 1 dose ordered again for this evening to promote nighttime sleep and continue to address migraine control  -Changing amlodipine 5 mg to verapamil 120 mg SR for dual BP control and migraine control indications -Sumatriptan burst 50 mg q12hr x 9 doses ordered, may be stopped early if headache resolved  -Discontinue firocet, no changes to opiates for now but encouraging that patient is not asking for this medication -Ambulatory referral to [place] placed for 2-4 week follow-up -Neurology will continue to follow    [place] [phone]   Greater than 35 minutes spent in care of this patient today, greater than 50% at bedside

## 2022-08-16 ENCOUNTER — Inpatient Hospital Stay: Payer: BC Managed Care – PPO

## 2022-08-16 DIAGNOSIS — Z8669 Personal history of other diseases of the nervous system and sense organs: Secondary | ICD-10-CM

## 2022-08-16 DIAGNOSIS — R519 Headache, unspecified: Secondary | ICD-10-CM

## 2022-08-16 DIAGNOSIS — J181 Lobar pneumonia, unspecified organism: Secondary | ICD-10-CM

## 2022-08-16 DIAGNOSIS — F32A Depression, unspecified: Secondary | ICD-10-CM

## 2022-08-16 DIAGNOSIS — L4050 Arthropathic psoriasis, unspecified: Secondary | ICD-10-CM

## 2022-08-16 DIAGNOSIS — I10 Essential (primary) hypertension: Secondary | ICD-10-CM

## 2022-08-16 DIAGNOSIS — B021 Zoster meningitis: Secondary | ICD-10-CM | POA: Diagnosis not present

## 2022-08-16 DIAGNOSIS — E669 Obesity, unspecified: Secondary | ICD-10-CM

## 2022-08-16 DIAGNOSIS — B0230 Zoster ocular disease, unspecified: Secondary | ICD-10-CM | POA: Diagnosis not present

## 2022-08-16 DIAGNOSIS — F419 Anxiety disorder, unspecified: Secondary | ICD-10-CM

## 2022-08-16 LAB — RESPIRATORY PANEL BY PCR

## 2022-08-16 LAB — BASIC METABOLIC PANEL
Anion gap: 7 (ref 5–15)
BUN: 7 mg/dL (ref 6–20)
CO2: 20 mmol/L — ABNORMAL LOW (ref 22–32)
Calcium: 8 mg/dL — ABNORMAL LOW (ref 8.9–10.3)
Chloride: 110 mmol/L (ref 98–111)
Creatinine, Ser: 0.99 mg/dL (ref 0.44–1.00)
GFR, Estimated: >60 mL/min (ref >60)
Glucose, Bld: 89 mg/dL (ref 70–99)
Potassium: 3.5 mmol/L (ref 3.5–5.1)
Sodium: 137 mmol/L (ref 135–145)

## 2022-08-16 LAB — RESP PANEL BY RT-PCR (RSV, FLU A&B, COVID)  RVPGX2
Influenza A by PCR: NEGATIVE
Influenza B by PCR: NEGATIVE
Resp Syncytial Virus by PCR: NEGATIVE
SARS Coronavirus 2 by RT PCR: NEGATIVE

## 2022-08-16 MED ORDER — AZITHROMYCIN 250 MG PO TABS
500.0000 mg | ORAL_TABLET | Freq: Every day | ORAL | Status: AC
  Administered 2022-08-16: 500 mg via ORAL
  Filled 2022-08-16: qty 2

## 2022-08-16 MED ORDER — AZITHROMYCIN 250 MG PO TABS
250.0000 mg | ORAL_TABLET | Freq: Every day | ORAL | Status: DC
  Administered 2022-08-17: 250 mg via ORAL
  Filled 2022-08-16: qty 1

## 2022-08-16 MED ORDER — PIPERACILLIN-TAZOBACTAM 3.375 G IVPB
3.3750 g | Freq: Three times a day (TID) | INTRAVENOUS | Status: DC
  Administered 2022-08-16 – 2022-08-17 (×3): 3.375 g via INTRAVENOUS
  Filled 2022-08-16 (×4): qty 50

## 2022-08-16 NOTE — Assessment & Plan Note (Signed)
- 

## 2022-08-16 NOTE — Assessment & Plan Note (Signed)
With last height and weight in computer BMI 30.09

## 2022-08-16 NOTE — Progress Notes (Signed)
Progress Note   Patient: Sara Chapman JHE:[id number] DOB: [birth year] DOA: 07/31/2022     15 DOS: the patient was seen and examined on 08/16/2022   Brief hospital course: Sara Chapman is a 47 y.o. female with medical history significant of hypertension, migraine headaches, psoriatic arthritis on infliximab, anxiety and depression. She was admitted 07/31/22 with right facial shingles involving the right side of the face, forehead and R eye. She had some associated fever and confusion and so was admitted with empiric abx after LP.    Hospital course complicated by severe persistent headaches requiring multiple medications. Finally getting some improvement with recent changes outlined below. Neurology resumed following 1/15.  1/17.  Patient states that she has not been feeling well for 2 days she has been coughing and had a fever this morning.  Chest x-ray showing left lateral pneumonia.  Antibiotics started.  Repeat COVID testing, influenza and RSV negative.  Viral respiratory panel pending.  Assessment and Plan: * Varicella zoster meningitis Continue IV acyclovir to 08/17/2022.  IV fluids while on acyclovir.  Herpes zoster ophthalmicus Right.  Continue acyclovir.  Continue steroid eyedrops.  Headache As per neurology continue ketorolac, Compazine and Benadryl and sumatriptan hand.  Continue verapamil.  Lobar pneumonia ([place]) Starting Zosyn and Zithromax today.  Repeat COVID, influenza and RSV negative.  Viral respiratory panel pending.  Anxiety and depression On Ativan and Zoloft  Obesity (BMI 30-39.9) With last height and weight in computer BMI 30.09  Essential hypertension Continue verapamil  Psoriatic arthritis ([place]) Follow-up as outpatient.        Subjective: Patient not feeling well for the last 2 days.  Had low-grade fever this morning.  Some cough.  Being treated for varicella zoster meningitis.  Still having some headache but better than recent.  Physical  Exam: Vitals:   08/16/22 0515 08/16/22 0632 08/16/22 0818 08/16/22 1205  BP: (!) 143/80  116/61 106/87  Pulse: 76  62 83  Resp: 18  18   Temp: (!) 100.4 F (38 C) 98.4 F (36.9 C) 98.1 F (36.7 C)   TempSrc:      SpO2: 94%  95% 94%  Weight:      Height:       Physical Exam HENT:     Head: Normocephalic.     Mouth/Throat:     Pharynx: No oropharyngeal exudate.  Eyes:     General: Lids are normal.     Conjunctiva/sclera: Conjunctivae normal.  Cardiovascular:     Rate and Rhythm: Normal rate and regular rhythm.     Heart sounds: Normal heart sounds, S1 normal and S2 normal.  Pulmonary:     Breath sounds: No decreased breath sounds, wheezing, rhonchi or rales.  Abdominal:     Palpations: Abdomen is soft.     Tenderness: There is no abdominal tenderness.  Musculoskeletal:     Right lower leg: No swelling.     Left lower leg: No swelling.  Skin:    General: Skin is warm.     Findings: No rash.  Neurological:     Mental Status: She is alert and oriented to person, place, and time.     Data Reviewed: Chest x-ray showing left lobar pneumonia  Family Communication: Spoke with mother at the bedside  Disposition: Status is: Inpatient Remains inpatient appropriate because: New diagnosis of pneumonia today with fever.  Planned Discharge Destination: Home    Time spent: 28 minutes  Author: Loletha Grayer, MD 08/16/2022 3:52 PM  For on  call review [email or web address].

## 2022-08-16 NOTE — Progress Notes (Signed)
Pharmacy Antibiotic Note  Sara Chapman is a 47 y.o. 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  Pharmacy has been consulted for Acyclovir dosing.  Varicella Zoster Virus DNA CSF detected  HSV 1/2 PCR CSF negative.   Plan: Per [place] policy acyclovir dosing recommended using TBW or AdjBW if TBW >140 IBW.   Continue acyclovir dose adjusted using AdjBW 10 mg/kg TID. Plan for 14 days of treatment. If planning to do IV acyclovir for 10 days. Recommend discharging the patient on valacyclovir 1000 mg TID to complete 14 days of treatment. End date confirmed by Dr Ellis Savage Jan/18/2023  Continue fluids NS @ 75 ml/hr while on acyclovir.   Scr remains stable while on acyclovir IV. Neurology managing chronic headaches. Noted low grade fever this morning. MD to assess and determine if additional Abx coverage is needed*     Height: 4\' 10"  (147.3 cm) Weight: 65.3 kg (144 lb) IBW/kg (Calculated) : 40.9  Temp (24hrs), Avg:99.1 F (37.3 C), Min:98.1 F (36.7 C), Max:100.4 F (38 C)  Recent Labs  Lab 08/11/22 0627 08/14/22 0300 08/16/22 0520  WBC 5.5  --   --   CREATININE 0.78 0.94 0.99    Estimated Creatinine Clearance: 55.6 mL/min (by C-G formula based on SCr of 0.99 mg/dL).    Allergies  Allergen Reactions   Clonazepam Other (See Comments)    SUICIDAL THOUGHTS     Ibuprofen Other (See Comments)    stomach ulcers    Nsaids Other (See Comments)    GI ulcers   Tolmetin Other (See Comments)    GI ulcers    Toradol [Ketorolac Tromethamine] Other (See Comments)    Confusion   Pseudoephedrine Hcl Palpitations and Other (See Comments)        Tape Rash    Adhesive tape breaks down the skin and causes sores, use paper tape    Thank you for allowing pharmacy to be a part of this patient's care.  Ralyn Stlaurent Rodriguez-Guzman PharmD, BCPS 08/16/2022 10:35 AM

## 2022-08-16 NOTE — Consult Note (Signed)
Pharmacy Antibiotic Note  Sara Chapman is a 47 y.o. female admitted on 07/31/2022 with pneumonia.  Pharmacy has been consulted for Zosyn dosing.  Plan: Zosyn 3.375g IV q8h (4 hour infusion).  Height: 4\' 10"  (147.3 cm) Weight: 65.3 kg (144 lb) IBW/kg (Calculated) : 40.9  Temp (24hrs), Avg:99.1 F (37.3 C), Min:98.1 F (36.7 C), Max:100.4 F (38 C)  Recent Labs  Lab 08/11/22 0627 08/14/22 0300 08/16/22 0520  WBC 5.5  --   --   CREATININE 0.78 0.94 0.99    Estimated Creatinine Clearance: 55.6 mL/min (by C-G formula based on SCr of 0.99 mg/dL).    Allergies  Allergen Reactions   Clonazepam Other (See Comments)    SUICIDAL THOUGHTS     Ibuprofen Other (See Comments)    stomach ulcers    Nsaids Other (See Comments)    GI ulcers   Tolmetin Other (See Comments)    GI ulcers    Toradol [Ketorolac Tromethamine] Other (See Comments)    Confusion   Pseudoephedrine Hcl Palpitations and Other (See Comments)        Tape Rash    Adhesive tape breaks down the skin and causes sores, use paper tape    Antimicrobials this admission: Acyclovir IVPB last dose due 1/18 Zosyn 1/17 >>  Azithromycin  1/17 >>   Thank you for allowing pharmacy to be a part of this patient's care.  Deneen Slager Rodriguez-Guzman PharmD, BCPS 08/16/2022 10:46 AM

## 2022-08-16 NOTE — Plan of Care (Signed)

## 2022-08-16 NOTE — Assessment & Plan Note (Addendum)
Mycoplasma pneumoniae positive on respiratory panel swab.  Zithromax was started yesterday and received a dose today.  3 more doses upon discharge.  As needed Tessalon Perles and Tussionex.

## 2022-08-16 NOTE — Assessment & Plan Note (Addendum)
Continue verapamil.  Continue a few more doses of sumatriptan.  Patient will follow-up at the headache clinic.  She is interested in going back on the Topamax which she was on in the past.

## 2022-08-16 NOTE — Plan of Care (Signed)

## 2022-08-16 NOTE — Assessment & Plan Note (Addendum)
Right.  Completed full course of IV acyclovir and 2 weeks of the steroid eyedrop.

## 2022-08-16 NOTE — Progress Notes (Addendum)
Neurology Progress Note  Patient ID: Sara Chapman is a 47 y.o. with PMHx of hypertension, psoriasis on infliximab, migraine headaches, anxiety/depression, gastric sleeve, recent diagnosis of VZV meningitis and right facial shingles  She reports her headache actually started on December 23 with intermittent nagging pain relieved by 2 Excedrin Migraine taken nearly daily initially until the 27th when the headache changed character for which she presented to urgent care hoping for a migraine cocktail.  However due to her history she was referred to the ED for further evaluation.  She visited multiple ED's due to long wait times and 1 was diagnosed with a bacterial infection of the eye but also given valacyclovir for this, which she started.  Her headache continued to worsen and then she developed a rash on the right eye for which she presented on January 1.  Due to the worsening headache on January 2 she returned and lumbar puncture revealed VZV meningitis for which she has been treated with IV valacyclovir with improvement in her rash.  However her headache remains persistent  She describes it as a stabbing pain side locked to the right side with associated light sensitivity, sound sensitivity, nausea, no vomiting but severely reduced appetite, has waxed and waned in magnitude but has been persistent, no clear since that any particular medication has been more helpful than the other.  She has been receiving Lyrica 75 mg twice daily, increased to 100 mg 1/14 night and now 150 mg twice daily 1/15 by primary team.  In addition she received Depakote 500 mg 1 dose 1/14, was restarted on Fioricet on 1/12, and has been getting opiates very regularly (Dilaudid initially IV 1 mg every 4 hours and transitioned to p.o. initially 4 mg now 3 mg).    She additionally received ketorolac 15 mg 1:30 PM on 1/13 and family noted she was very delirious that day, however, family consented to re-trial of this  medication  Initial LP OP 35, repeat on 1/15 with OP of 18, overall low concern for low or high pressure headache  1/15 -Ketorolac 30 mg once followed by 15 mg every 6 hours x 12 doses, can be discontinued if she becomes delirious again -Benadryl 25 mg every 6 hours -Compazine 10 mg every 6 hours -Magnesium 2 mg IV once, with magnesium level check in the morning (resulted 2.2) -Depakote 1000 mg x 1/15 in the afternoon and 1/16 in the evening -Discontinue firocet, no changes to opiates for now (but did not take any since 1/15 at 8 AM), ordered PRN  1/16 -Sumatriptan 50 mg every 12 hours burst started (for up to 9 doses) -Amlodipine changed to verapamil 120 SR  Subjective/interval: -Refused her 1 AM migraine cocktail, pain score documented as 0 since 8:30 PM last night but at this time she reports to me her headache is approximately a 2 -Headache worsens to 5/10 with sitting up -Reports she has had a cough for the past 2 days   Current Facility-Administered Medications:    0.9 %  sodium chloride infusion, , Intravenous, Continuous, Acheampong, Warnell Bureau, MD, Last Rate: 75 mL/hr at 08/16/22 1033, New Bag at 08/16/22 1033   acetaminophen (TYLENOL) tablet 650 mg, 650 mg, Oral, Q6H PRN, Nicole Kindred A, DO, 650 mg at 08/16/22 0520   acyclovir (ZOVIRAX) 530 mg in dextrose 5 % 100 mL IVPB, 10 mg/kg (Order-Specific), Intravenous, Q8H, Oswald Hillock, RPH, Last Rate: 110.6 mL/hr at 08/16/22 0458, 530 mg at 08/16/22 0458   albuterol (PROVENTIL) (2.5  MG/3ML) 0.083% nebulizer solution 2.5 mg, 2.5 mg, Nebulization, Q2H PRN, Acheampong, Warnell Bureau, MD   [COMPLETED] azithromycin [facility]) tablet 500 mg, 500 mg, Oral, Daily, 500 mg at 08/16/22 1105 **FOLLOWED BY** [START ON 08/17/2022] azithromycin (ZITHROMAX) tablet 250 mg, 250 mg, Oral, Daily, Wieting, Richard, MD   bisacodyl (DULCOLAX) EC tablet 5 mg, 5 mg, Oral, Daily PRN, Acheampong, Warnell Bureau, MD   buPROPion (WELLBUTRIN XL) 24 hr tablet 150 mg, 150  mg, Oral, Daily, Acheampong, Warnell Bureau, MD, 150 mg at 08/16/22 0930   busPIRone (BUSPAR) tablet 10 mg, 10 mg, Oral, BID, Acheampong, Warnell Bureau, MD, 10 mg at 08/16/22 0930   diphenhydrAMINE (BENADRYL) injection 25 mg, 25 mg, Intravenous, Q6H, 25 mg at 08/16/22 0458 **AND** prochlorperazine (COMPAZINE) injection 10 mg, 10 mg, Intravenous, Q6H, Charlottie Peragine L, MD, 10 mg at 08/16/22 0501   enoxaparin (LOVENOX) injection 40 mg, 40 mg, Subcutaneous, Q24H, Benita Gutter, RPH, 40 mg at [id number]   folic acid (FOLVITE) tablet 1 mg, 1 mg, Oral, Daily, Acheampong, Warnell Bureau, MD, 1 mg at 08/16/22 0930   guaiFENesin-dextromethorphan (ROBITUSSIN DM) 100-10 MG/5ML syrup 5 mL, 5 mL, Oral, Q4H PRN, Nicole Kindred A, DO   HYDROmorphone (DILAUDID) tablet 2 mg, 2 mg, Oral, Q4H PRN, Nicole Kindred A, DO   [COMPLETED] ketorolac (TORADOL) 30 MG/ML injection 30 mg, 30 mg, Intravenous, Once, 30 mg at 08/14/22 1322 **FOLLOWED BY** ketorolac (TORADOL) 30 MG/ML injection 15 mg, 15 mg, Intravenous, Q6H, Sydny Schnitzler L, MD, 15 mg at 08/16/22 0500   LORazepam (ATIVAN) tablet 0.5 mg, 0.5 mg, Oral, BID PRN, Nicole Kindred A, DO, 0.5 mg at 08/14/22 1149   ondansetron (ZOFRAN) injection 4 mg, 4 mg, Intravenous, Q6H PRN, Hollice Gong, Mir Mohammed, MD, 4 mg at 08/03/22 1950   ondansetron (ZOFRAN-ODT) disintegrating tablet 4 mg, 4 mg, Oral, Q6H PRN, Acheampong, Warnell Bureau, MD, 4 mg at 08/01/22 0146   pantoprazole (PROTONIX) EC tablet 40 mg, 40 mg, Oral, Daily, Acheampong, Warnell Bureau, MD, 40 mg at 08/16/22 0930   piperacillin-tazobactam (ZOSYN) IVPB 3.375 g, 3.375 g, Intravenous, Q8H, Wieting, Richard, MD, Last Rate: 12.5 mL/hr at 08/16/22 1109, 3.375 g at 08/16/22 1109   prednisoLONE acetate (PRED FORTE) 1 % ophthalmic suspension 2 drop, 2 drop, Right Eye, BID, Hollice Gong, Mir Mohammed, MD, 2 drop at 08/16/22 0930   pregabalin (LYRICA) capsule 150 mg, 150 mg, Oral, BID, Nicole Kindred A, DO, 150 mg at 08/16/22 0930   promethazine  (PHENERGAN) tablet 25 mg, 25 mg, Oral, Q6H PRN, Nicole Kindred A, DO   senna-docusate (Senokot-S) tablet 1 tablet, 1 tablet, Oral, QHS PRN, Acheampong, Warnell Bureau, MD   sertraline (ZOLOFT) tablet 50 mg, 50 mg, Oral, Daily, Acheampong, Warnell Bureau, MD, 50 mg at 08/16/22 0930   SUMAtriptan (IMITREX) tablet 50 mg, 50 mg, Oral, Q12H, Valma Rotenberg L, MD, 50 mg at 08/16/22 0930   traZODone (DESYREL) tablet 50 mg, 50 mg, Oral, QHS, Nicole Kindred A, DO, 50 mg at 08/15/22 2126   verapamil (CALAN-SR) CR tablet 120 mg, 120 mg, Oral, Daily, Thalia Turkington L, MD, 120 mg at 08/16/22 0930   Exam: Current vital signs: BP 106/87 (BP Location: Right Arm)   Pulse 83   Temp 98.1 F (36.7 C)   Resp 18   Ht 4' 10"$  (1.473 m)   Wt 65.3 kg   SpO2 94%   BMI 30.10 kg/m  Vital signs in last 24 hours: Temp:  [98.1 F (36.7 C)-100.4 F (38 C)] 98.1 F (36.7  C) (01/17 0818) Pulse Rate:  [62-89] 83 (01/17 1205) Resp:  [18-21] 18 (01/17 0818) BP: (106-143)/(61-96) 106/87 (01/17 1205) SpO2:  [90 %-96 %] 94 % (01/17 1205)   Gen: In bed, sleepy, much more comfortable appearing Resp: non-labored breathing, no grossly audible wheezing Cardiac: Perfusing extremities well  Abd: soft, nt  Neuro: MS: Oriented to person/place/situation CN: pupils equal round reactive to light, orients to stimuli in bilateral visual fields, EOMI,  face symmetric, tongue midline, Motor: Mild pronation of the bilateral upper extremities without drift.  Intact finger-nose and heel-to-shin bilaterally. Sensation: Intact in all 4 extremities and symmetric  Pertinent Labs:  Basic Metabolic Panel: Recent Labs  Lab 08/11/22 0627 08/14/22 0300 08/15/22 0440 08/16/22 0520  NA 140 139  --  137  K 3.4* 3.3*  --  3.5  CL 110 107  --  110  CO2 22 24  --  20*  GLUCOSE 114* 84  --  89  BUN 8 5*  --  7  CREATININE 0.78 0.94  --  0.99  CALCIUM 8.6* 7.9*  --  8.0*  MG  --   --  2.3  --      CBC: Recent Labs  Lab 08/11/22 0627   WBC 5.5  HGB 13.1  HCT 38.0  MCV 92.9  PLT 228     Coagulation Studies: No results for input(s): "LABPROT", "INR" in the last 72 hours.   Chest x-ray with concerns for pneumonia  Impression: Most likely persistent severe migraine headache in the setting of recent well treated VZV meningitis, end date planned for tomorrow for IV acyclovir.  I do not suspect a significant low pressure component at this time as it is mainly with position change that she feels headache severity increase, and the rest of her neurological examination remains reassuring.  There may have been a component of medication overuse headache and given she has been on substantial doses of opiates for 2 weeks now as well as intermittent Fioricet.  Repeat LP with opening pressure of 18 was also reassuring against any significant low or high pressure component to her headache  Headache had continued to improve but now worsening again in the setting of pneumonia  Recommendations: -Continue ketorolac for now, note that she skipped several doses so could get an extension for 1-2 doses if needed (was ordered for 12 doses) -Benadryl 25 mg every 6 hours, Compazine 10 mg every 6 hours will give 1 more dose only this evening and then discontinue (order adjusted) -Continue sumatriptan 50 mg Q12hr for now (Started 1/16) for up to 9 doses; may be stopped early but would give 1 additional dose after her headache is fully resolved  Additionally -Continue verapamil 120 mg SR for dual BP control and migraine control indications -Ambulatory referral to [place] placed for 2-4 week follow-up; patient encouraged to call to set up appointment -Appreciate management of comorbidities per primary team including management of pneumonia  Inpatient neurology will be available as needed please reach out if any additional questions or concerns arise.  Recommendations conveyed via secure chat to Dr. Hedda Slade MD-PhD [facility] [phone]   Greater than 35 minutes spent in care of this patient today, greater than 50% at bedside

## 2022-08-16 NOTE — Assessment & Plan Note (Addendum)
On BuSpar and Wellbutrin

## 2022-08-16 NOTE — Assessment & Plan Note (Signed)
Follow up as outpatient.  

## 2022-08-16 NOTE — Hospital Course (Addendum)
Sara Chapman is a 47 y.o. female with medical history significant of hypertension, migraine headaches, psoriatic arthritis on infliximab, anxiety and depression. She was admitted 07/31/22 with right facial shingles involving the right side of the face, forehead and R eye. She had some associated fever and confusion and so was admitted with empiric abx after LP.    Hospital course complicated by severe persistent headaches requiring multiple medications. Finally getting some improvement with recent changes outlined below. Neurology resumed following 1/15.  1/17.  Patient states that she has not been feeling well for 2 days she has been coughing and had a fever this morning.  Chest x-ray showing left lateral pneumonia.  Antibiotics started.  Repeat COVID testing, influenza and RSV negative.  Viral respiratory showed Mycoplasma pneumoniae. 1/18.  Patient feeling a little bit better.  Course of acyclovir finished.  On Zithromax for mycoplasma pneumoniae pneumonia.

## 2022-08-16 NOTE — Assessment & Plan Note (Addendum)
Patient completed full course of IV acyclovir to 08/17/2022.

## 2022-08-17 DIAGNOSIS — B021 Zoster meningitis: Secondary | ICD-10-CM | POA: Diagnosis not present

## 2022-08-17 DIAGNOSIS — B0230 Zoster ocular disease, unspecified: Secondary | ICD-10-CM | POA: Diagnosis not present

## 2022-08-17 DIAGNOSIS — R519 Headache, unspecified: Secondary | ICD-10-CM | POA: Diagnosis not present

## 2022-08-17 DIAGNOSIS — J181 Lobar pneumonia, unspecified organism: Secondary | ICD-10-CM | POA: Diagnosis not present

## 2022-08-17 MED ORDER — BENZONATATE 100 MG PO CAPS
100.0000 mg | ORAL_CAPSULE | Freq: Three times a day (TID) | ORAL | Status: DC
  Administered 2022-08-17: 100 mg via ORAL
  Filled 2022-08-17: qty 1

## 2022-08-17 MED ORDER — SUMATRIPTAN SUCCINATE 50 MG PO TABS: 50.0000 mg | ORAL_TABLET | Freq: Two times a day (BID) | ORAL | 0 refills | Status: DC

## 2022-08-17 MED ORDER — PREGABALIN 150 MG PO CAPS: 150.0000 mg | ORAL_CAPSULE | Freq: Two times a day (BID) | ORAL | 0 refills | Status: DC

## 2022-08-17 MED ORDER — VERAPAMIL HCL ER 120 MG PO TBCR: 120.0000 mg | EXTENDED_RELEASE_TABLET | Freq: Every day | ORAL | 0 refills | Status: DC

## 2022-08-17 MED ORDER — HYDROCOD POLI-CHLORPHE POLI ER 10-8 MG/5ML PO SUER
5.0000 mL | Freq: Two times a day (BID) | ORAL | Status: DC | PRN
  Administered 2022-08-17: 5 mL via ORAL
  Filled 2022-08-17: qty 5

## 2022-08-17 MED ORDER — TOPIRAMATE 25 MG PO TABS: 25.0000 mg | ORAL_TABLET | Freq: Every day | ORAL | 0 refills | Status: DC

## 2022-08-17 MED ORDER — BENZONATATE 100 MG PO CAPS: 100.0000 mg | ORAL_CAPSULE | Freq: Three times a day (TID) | ORAL | 0 refills | Status: AC

## 2022-08-17 MED ORDER — HYDROCOD POLI-CHLORPHE POLI ER 10-8 MG/5ML PO SUER: 5.0000 mL | Freq: Two times a day (BID) | ORAL | 0 refills | Status: DC | PRN

## 2022-08-17 MED ORDER — AZITHROMYCIN 250 MG PO TABS: ORAL_TABLET | ORAL | 0 refills | Status: DC

## 2022-08-17 NOTE — Plan of Care (Signed)
  Problem: Education: Goal: Knowledge of General Education information will improve Description: Including pain rating scale, medication(s)/side effects and non-pharmacologic comfort measures Outcome: Progressing   Problem: Clinical Measurements: Goal: Respiratory complications will improve Outcome: Progressing   Problem: Clinical Measurements: Goal: Cardiovascular complication will be avoided Outcome: Progressing   Problem: Activity: Goal: Risk for activity intolerance will decrease Outcome: Progressing   Problem: Nutrition: Goal: Adequate nutrition will be maintained Outcome: Progressing   Problem: Pain Managment: Goal: General experience of comfort will improve Outcome: Progressing   Problem: Safety: Goal: Ability to remain free from injury will improve Outcome: Progressing

## 2022-08-17 NOTE — Discharge Summary (Addendum)
Physician Discharge Summary   Patient: Sara Chapman MRN: [id number] DOB: [birth year]  Admit date:     07/31/2022  Discharge date: 08/17/22  Discharge Physician: Loletha Grayer   PCP: Midge Minium, PA   Recommendations at discharge:   Follow-up PCP 5 days Follow-up outpatient neurology Follow-up ophthalmology 1 month  Discharge Diagnoses: Principal Problem:   Varicella zoster meningitis Active Problems:   Herpes zoster ophthalmicus   Headache   Lobar pneumonia (HCC)   Psoriatic arthritis ([place])   Essential hypertension   History of migraine   Obesity (BMI 30-39.9)   Anxiety and depression    Hospital Course: Sara Chapman is a 47 y.o. female with medical history significant of hypertension, migraine headaches, psoriatic arthritis on infliximab, anxiety and depression. She was admitted 07/31/22 with right facial shingles involving the right side of the face, forehead and R eye. She had some associated fever and confusion and so was admitted with empiric abx after LP.    Hospital course complicated by severe persistent headaches requiring multiple medications. Finally getting some improvement with recent changes outlined below. Neurology resumed following 1/15.  1/17.  Patient states that she has not been feeling well for 2 days she has been coughing and had a fever this morning.  Chest x-ray showing left lateral pneumonia.  Antibiotics started.  Repeat COVID testing, influenza and RSV negative.  Viral respiratory showed Mycoplasma pneumoniae. 1/18.  Patient feeling a little bit better.  Course of acyclovir finished.  On Zithromax for mycoplasma pneumoniae pneumonia.  Assessment and Plan: * Varicella zoster meningitis Patient completed full course of IV acyclovir to 08/17/2022.   Herpes zoster ophthalmicus Right.  Completed full course of IV acyclovir and 2 weeks of the steroid eyedrop.  Headache Continue verapamil.  Continue a few more doses of  sumatriptan.  Patient will follow-up at the headache clinic.  She is interested in going back on the Topamax which she was on in the past.  Lobar pneumonia ([place]) Mycoplasma pneumoniae positive on respiratory panel swab.  Zithromax was started yesterday and received a dose today.  3 more doses upon discharge.  As needed Tessalon Perles and Tussionex.  Anxiety and depression On BuSpar and Wellbutrin  Obesity (BMI 30-39.9) With last height and weight in computer BMI 30.09  Essential hypertension Continue verapamil  Psoriatic arthritis ([place]) Follow-up as outpatient.         Consultants: Neurology, ophthalmology Procedures performed: Lumbar puncture Disposition: Home Diet recommendation:  Cardiac diet DISCHARGE MEDICATION: Allergies as of 08/17/2022       Reactions   Clonazepam Other (See Comments)   SUICIDAL THOUGHTS   Ibuprofen Other (See Comments)   stomach ulcers   Nsaids Other (See Comments)   GI ulcers   Tolmetin Other (See Comments)   GI ulcers   Toradol [ketorolac Tromethamine] Other (See Comments)   Confusion   Pseudoephedrine Hcl Palpitations, Other (See Comments)      Tape Rash   Adhesive tape breaks down the skin and causes sores, use paper tape        Medication List     STOP taking these medications    amLODipine 5 MG tablet Commonly known as: NORVASC   B-12 PO   clindamycin 300 MG capsule Commonly known as: CLEOCIN   gabapentin 100 MG capsule Commonly known as: NEURONTIN   INFLIXIMAB IV   MAGNESIUM PO   ondansetron 4 MG disintegrating tablet Commonly known as: ZOFRAN-ODT   oxyCODONE-acetaminophen 5-325 MG tablet Commonly known as: Percocet  prednisoLONE acetate 1 % ophthalmic suspension Commonly known as: prednisoLONE Acetate P-F   traMADol 50 MG tablet Commonly known as: ULTRAM   valACYclovir 1000 MG tablet Commonly known as: VALTREX   VITAMIN C PO       TAKE these medications    azithromycin 250 MG  tablet Commonly known as: ZITHROMAX One tab po daily for 3 days Start taking on: August 18, 2022   benzonatate 100 MG capsule Commonly known as: TESSALON Take 1 capsule (100 mg total) by mouth 3 (three) times daily for 10 days.   buPROPion 150 MG 24 hr tablet Commonly known as: WELLBUTRIN XL Take 150 mg by mouth daily.   busPIRone 10 MG tablet Commonly known as: BUSPAR Take 10 mg by mouth 2 (two) times daily.   calcium citrate 950 (200 Ca) MG tablet Commonly known as: CALCITRATE - dosed in mg elemental calcium Take 500 mg by mouth 3 (three) times daily.   chlorpheniramine-HYDROcodone 10-8 MG/5ML Commonly known as: TUSSIONEX Take 5 mLs by mouth every 12 (twelve) hours as needed for cough.   folic acid 1 MG tablet Commonly known as: FOLVITE Take 1 mg by mouth daily.   multivitamin tablet Take 1 tablet by mouth daily.   pantoprazole 40 MG tablet Commonly known as: PROTONIX Take 1 tablet (40 mg total) by mouth daily.   pregabalin 150 MG capsule Commonly known as: LYRICA Take 1 capsule (150 mg total) by mouth 2 (two) times daily.   sertraline 50 MG tablet Commonly known as: ZOLOFT Take 50 mg by mouth daily.   SUMAtriptan 50 MG tablet Commonly known as: IMITREX Take 1 tablet (50 mg total) by mouth every 12 (twelve) hours for 4 doses. May repeat in 2 hours if headache persists or recurs.   topiramate 25 MG tablet Commonly known as: TOPAMAX Take 1 tablet (25 mg total) by mouth at bedtime. What changed: when to take this   traZODone 50 MG tablet Commonly known as: DESYREL Take 50 mg by mouth at bedtime.   verapamil 120 MG CR tablet Commonly known as: CALAN-SR Take 1 tablet (120 mg total) by mouth daily. Start taking on: August 18, 2022   VITAMIN D PO Take 1 tablet by mouth daily.        Follow-up Information     Cathie HoopsOwens, Leanne Whaley, [place]. Go in 5 day(s).   Specialty: Family Medicine Why: Appointment on Friday, 08/25/2022 at 4:00pm. Contact  information: [address] [place] [address] [phone]         Galen ManilaPorfilio, William, MD Follow up in 1 month(s).   Specialty: Ophthalmology Contact information: [address] [place] [id number] [phone]                Discharge Exam: Ceasar MonsFiled Weights   07/31/22 2054  Weight: 65.3 kg   Physical Exam HENT:     Head: Normocephalic.     Mouth/Throat:     Pharynx: No oropharyngeal exudate.  Eyes:     General: Lids are normal.     Conjunctiva/sclera: Conjunctivae normal.  Cardiovascular:     Rate and Rhythm: Normal rate and regular rhythm.     Heart sounds: Normal heart sounds, S1 normal and S2 normal.  Pulmonary:     Breath sounds: Examination of the left-lower field reveals rhonchi. Rhonchi present. No decreased breath sounds, wheezing or rales.  Abdominal:     Palpations: Abdomen is soft.     Tenderness: There is no abdominal tenderness.  Musculoskeletal:  Right lower leg: No swelling.     Left lower leg: No swelling.  Skin:    General: Skin is warm.     Findings: No rash.  Neurological:     Mental Status: She is alert and oriented to person, place, and time.      Condition at discharge: stable  The results of significant diagnostics from this hospitalization (including imaging, microbiology, ancillary and laboratory) are listed below for reference.   Imaging Studies: DG Chest Port 1 View  Result Date: 08/16/2022 CLINICAL DATA:  Fever. EXAM: PORTABLE CHEST 1 VIEW COMPARISON:  Chest x-ray 07/31/2022. FINDINGS: Patchy opacities in the left lateral midlung. No visible pleural effusions or pneumothorax. Cardiomediastinal silhouette is within normal limits. No acute osseous abnormality. IMPRESSION: Patchy opacities in the left lateral midlung, suspicious for pneumonia. Followup PA and lateral chest X-ray is recommended in 3-4 weeks following trial of antibiotic therapy to ensure resolution and exclude underlying malignancy.  Electronically Signed   By: Feliberto Harts M.D.   On: 08/16/2022 09:00   DG FL GUIDED LUMBAR PUNCTURE  Result Date: 08/14/2022 CLINICAL DATA:  Persistent headache after viral meningitis. Concern for idiopathic intracranial hypertension. EXAM: DIAGNOSTIC LUMBAR PUNCTURE UNDER FLUOROSCOPIC GUIDANCE COMPARISON:  None Available. FLUOROSCOPY: Radiation Exposure Index (as provided by the fluoroscopic device): 0.6 mGy Kerma PROCEDURE: Informed consent was obtained from the patient prior to the procedure, including potential complications of headache, allergy, and pain. With the patient prone, the lower back was prepped with Betadine. 1% Lidocaine was used for local anesthesia. Lumbar puncture was performed at the L5-S1 level using a 22 gauge needle with return of clear, colorless CSF with an opening pressure of 18 cm water. 0 ml of CSF were obtained for laboratory studies (none requested). The patient tolerated the procedure well and there were no apparent complications. IMPRESSION: Successful lumbar puncture under fluoroscopic guidance with opening pressure of 18 cm of water. Electronically Signed   By: Orvan Falconer M.D   On: 08/14/2022 13:07   MR BRAIN W WO CONTRAST  Result Date: 08/02/2022 CLINICAL DATA:  Periorbital cellulitis, concern for VZV ophthalmicus +/-no prior MRI meningitis EXAM: MRI HEAD AND ORBITS WITHOUT AND WITH CONTRAST TECHNIQUE: Multiplanar, multiecho pulse sequences of the brain and surrounding structures were obtained without and with intravenous contrast. Multiplanar, multiecho pulse sequences of the orbits and surrounding structures were obtained including fat saturation techniques, before and after intravenous contrast administration. CONTRAST:  80mL GADAVIST GADOBUTROL 1 MMOL/ML IV SOLN COMPARISON:  Available, correlation is made with CT head and orbits 07/31/2022 FINDINGS: MRI HEAD FINDINGS Brain: No abnormal parenchymal or meningeal enhancement. No restricted diffusion to suggest  acute or subacute infarct. No extra-axial collection. No acute hemorrhage, mass, mass effect, or midline shift. No hydrocephalus. No hemosiderin deposition to suggest remote hemorrhage. Cerebral volume is normal for age. Partial empty sella. Normal craniocervical junction. Vascular: Normal arterial flow voids. Normal arterial and venous enhancement. Skull and upper cervical spine: Normal marrow signal. Other: The mastoids are well aerated. MRI ORBITS FINDINGS Orbits: No traumatic or inflammatory finding. Globes, optic nerves, orbital fat, extraocular muscles, vascular structures, and lacrimal glands are normal. No abnormal enhancement within the bilateral orbits. Visualized sinuses: Clear paranasal sinuses. Soft tissues: Right periorbital soft tissue thickening and hyperenhancement, which appears decreased compared to the 07/31/2022 CT when accounting for differences in technique. IMPRESSION: 1. Right periorbital soft tissue thickening and hyperenhancement, which appears decreased compared to the 07/31/2022 CT when accounting for differences in technique. 2. No acute intracranial process.  3. No traumatic or inflammatory finding in the orbits. Electronically Signed   By: Wiliam KeAlison  Vasan M.D.   On: 08/02/2022 19:43   MR ORBITS W WO CONTRAST  Result Date: 08/02/2022 CLINICAL DATA:  Periorbital cellulitis, concern for VZV ophthalmicus +/-no prior MRI meningitis EXAM: MRI HEAD AND ORBITS WITHOUT AND WITH CONTRAST TECHNIQUE: Multiplanar, multiecho pulse sequences of the brain and surrounding structures were obtained without and with intravenous contrast. Multiplanar, multiecho pulse sequences of the orbits and surrounding structures were obtained including fat saturation techniques, before and after intravenous contrast administration. CONTRAST:  6mL GADAVIST GADOBUTROL 1 MMOL/ML IV SOLN COMPARISON:  Available, correlation is made with CT head and orbits 07/31/2022 FINDINGS: MRI HEAD FINDINGS Brain: No abnormal  parenchymal or meningeal enhancement. No restricted diffusion to suggest acute or subacute infarct. No extra-axial collection. No acute hemorrhage, mass, mass effect, or midline shift. No hydrocephalus. No hemosiderin deposition to suggest remote hemorrhage. Cerebral volume is normal for age. Partial empty sella. Normal craniocervical junction. Vascular: Normal arterial flow voids. Normal arterial and venous enhancement. Skull and upper cervical spine: Normal marrow signal. Other: The mastoids are well aerated. MRI ORBITS FINDINGS Orbits: No traumatic or inflammatory finding. Globes, optic nerves, orbital fat, extraocular muscles, vascular structures, and lacrimal glands are normal. No abnormal enhancement within the bilateral orbits. Visualized sinuses: Clear paranasal sinuses. Soft tissues: Right periorbital soft tissue thickening and hyperenhancement, which appears decreased compared to the 07/31/2022 CT when accounting for differences in technique. IMPRESSION: 1. Right periorbital soft tissue thickening and hyperenhancement, which appears decreased compared to the 07/31/2022 CT when accounting for differences in technique. 2. No acute intracranial process. 3. No traumatic or inflammatory finding in the orbits. Electronically Signed   By: Wiliam KeAlison  Vasan M.D.   On: 08/02/2022 19:43   CT Orbits W Contrast  Result Date: 07/31/2022 CLINICAL DATA:  Right periorbital swelling EXAM: CT HEAD WITHOUT AND WITH CONTRAST CT ORBITS WITH CONTRAST TECHNIQUE: Multidetector CT imaging of the head was performed following the standard protocol before and after bolus injection of intravenous contrast. Multidetector CT imaging of the orbits were performed following the standard protocol during bolus administration of intravenous contrast. RADIATION DOSE REDUCTION: This exam was performed according to the departmental dose-optimization program which includes automated exposure control, adjustment of the mA and/or kV according to  patient size and/or use of iterative reconstruction technique. CONTRAST:  100mL OMNIPAQUE IOHEXOL 300 MG/ML  SOLN COMPARISON:  07/27/2022 FINDINGS: CT HEAD FINDINGS Brain: There is no mass, hemorrhage or extra-axial collection. Brain parenchyma and CSF spaces are normal. No abnormal enhancement. Incidentally noted partially empty sella. Vascular: No hyperdense vessel or unexpected calcification. Skull: Normal Other: None CT ORBITS FINDINGS Orbits: No traumatic or inflammatory finding. Globes, optic nerves, orbital fat, extraocular muscles, vascular structures, and lacrimal glands are normal. Visualized sinuses: Clear Soft tissues: Right periorbital soft tissue swelling has progressed since 12/28. No fluid collection. IMPRESSION: 1. Normal CT of the brain. 2. Right periorbital soft tissue swelling has progressed since 12/28. No fluid collection or intraorbital extension. Electronically Signed   By: Deatra RobinsonKevin  Herman M.D.   On: 07/31/2022 22:28   CT Head W or Wo Contrast  Result Date: 07/31/2022 CLINICAL DATA:  Right periorbital swelling EXAM: CT HEAD WITHOUT AND WITH CONTRAST CT ORBITS WITH CONTRAST TECHNIQUE: Multidetector CT imaging of the head was performed following the standard protocol before and after bolus injection of intravenous contrast. Multidetector CT imaging of the orbits were performed following the standard protocol during bolus administration of intravenous  contrast. RADIATION DOSE REDUCTION: This exam was performed according to the departmental dose-optimization program which includes automated exposure control, adjustment of the mA and/or kV according to patient size and/or use of iterative reconstruction technique. CONTRAST:  100mL OMNIPAQUE IOHEXOL 300 MG/ML  SOLN COMPARISON:  07/27/2022 FINDINGS: CT HEAD FINDINGS Brain: There is no mass, hemorrhage or extra-axial collection. Brain parenchyma and CSF spaces are normal. No abnormal enhancement. Incidentally noted partially empty sella. Vascular:  No hyperdense vessel or unexpected calcification. Skull: Normal Other: None CT ORBITS FINDINGS Orbits: No traumatic or inflammatory finding. Globes, optic nerves, orbital fat, extraocular muscles, vascular structures, and lacrimal glands are normal. Visualized sinuses: Clear Soft tissues: Right periorbital soft tissue swelling has progressed since 12/28. No fluid collection. IMPRESSION: 1. Normal CT of the brain. 2. Right periorbital soft tissue swelling has progressed since 12/28. No fluid collection or intraorbital extension. Electronically Signed   By: Deatra RobinsonKevin  Herman M.D.   On: 07/31/2022 22:28   DG Chest Port 1 View  Result Date: 07/31/2022 CLINICAL DATA:  Possible sepsis EXAM: PORTABLE CHEST 1 VIEW COMPARISON:  12/01/2021 FINDINGS: The heart size and mediastinal contours are within normal limits. Both lungs are clear. The visualized skeletal structures are unremarkable. IMPRESSION: No active disease. Electronically Signed   By: Ernie AvenaPalani  Rathinasamy M.D.   On: 07/31/2022 21:50    Microbiology: Results for orders placed or performed during the hospital encounter of 07/31/22  Blood Culture (routine x 2)     Status: None   Collection Time: 07/31/22  8:57 PM   Specimen: BLOOD  Result Value Ref Range Status   Specimen Description BLOOD BLOOD RIGHT HAND  Final   Special Requests   Final    BOTTLES DRAWN AEROBIC ONLY Blood Culture adequate volume   Culture   Final    NO GROWTH 5 DAYS Performed at [facility], [address] [place], [place] [address]    Report Status 08/05/2022 FINAL  Final  Resp panel by RT-PCR (RSV, Flu A&B, Covid) Anterior Nasal Swab     Status: None   Collection Time: 07/31/22 10:09 PM   Specimen: Anterior Nasal Swab  Result Value Ref Range Status   SARS Coronavirus 2 by RT PCR NEGATIVE NEGATIVE Final    Comment: (NOTE) SARS-CoV-2 target nucleic acids are NOT DETECTED.  The SARS-CoV-2 RNA is generally detectable in upper respiratory specimens during the acute  phase of infection. The lowest concentration of SARS-CoV-2 viral copies this assay can detect is 138 copies/mL. A negative result does not preclude SARS-Cov-2 infection and should not be used as the sole basis for treatment or other patient management decisions. A negative result may occur with  improper specimen collection/handling, submission of specimen other than nasopharyngeal swab, presence of viral mutation(s) within the areas targeted by this assay, and inadequate number of viral copies(<138 copies/mL). A negative result must be combined with clinical observations, patient history, and epidemiological information. The expected result is Negative.  Fact Sheet for Patients:  BloggerCourse.comhttps://www.fda.gov/media/152166/download  Fact Sheet for Healthcare Providers:  SeriousBroker.ithttps://www.fda.gov/media/152162/download  This test is no t yet approved or cleared by the [place] FDA and  has been authorized for detection and/or diagnosis of SARS-CoV-2 by FDA under an Emergency Use Authorization (EUA). This EUA will remain  in effect (meaning this test can be used) for the duration of the COVID-19 declaration under Section 564(b)(1) of the Act, 21 U.S.C.section 360bbb-3(b)(1), unless the authorization is terminated  or revoked sooner.       Influenza A  by PCR NEGATIVE NEGATIVE Final   Influenza B by PCR NEGATIVE NEGATIVE Final    Comment: (NOTE) The Xpert Xpress SARS-CoV-2/FLU/RSV plus assay is intended as an aid in the diagnosis of influenza from Nasopharyngeal swab specimens and should not be used as a sole basis for treatment. Nasal washings and aspirates are unacceptable for Xpert Xpress SARS-CoV-2/FLU/RSV testing.  Fact Sheet for Patients: BloggerCourse.com  Fact Sheet for Healthcare Providers: SeriousBroker.it  This test is not yet approved or cleared by the [place] FDA and has been authorized for detection and/or diagnosis of  SARS-CoV-2 by FDA under an Emergency Use Authorization (EUA). This EUA will remain in effect (meaning this test can be used) for the duration of the COVID-19 declaration under Section 564(b)(1) of the Act, 21 U.S.C. section 360bbb-3(b)(1), unless the authorization is terminated or revoked.     Resp Syncytial Virus by PCR NEGATIVE NEGATIVE Final    Comment: (NOTE) Fact Sheet for Patients: BloggerCourse.com  Fact Sheet for Healthcare Providers: SeriousBroker.it  This test is not yet approved or cleared by the [place] FDA and has been authorized for detection and/or diagnosis of SARS-CoV-2 by FDA under an Emergency Use Authorization (EUA). This EUA will remain in effect (meaning this test can be used) for the duration of the COVID-19 declaration under Section 564(b)(1) of the Act, 21 U.S.C. section 360bbb-3(b)(1), unless the authorization is terminated or revoked.  Performed at [facility], [address], [place], [place] [address]   Urine Culture     Status: Abnormal   Collection Time: 07/31/22 10:18 PM   Specimen: In/Out Cath Urine  Result Value Ref Range Status   Specimen Description   Final    IN/OUT CATH URINE Performed at [facility], [address], [place], [place] [address]    Special Requests   Final    NONE Performed at [facility], [address] [place], [place] [address]    Culture (A)  Final    <10,000 COLONIES/mL INSIGNIFICANT GROWTH Performed at [facility], [address] [place], [place] [address]    Report Status 08/02/2022 FINAL  Final  Blood Culture (routine x 2)     Status: None   Collection Time: 07/31/22 10:49 PM   Specimen: BLOOD  Result Value Ref Range Status   Specimen Description BLOOD LEFT FA  Final   Special Requests   Final    BOTTLES DRAWN AEROBIC AND ANAEROBIC Blood Culture results may not be optimal due to an inadequate volume of blood  received in culture bottles   Culture   Final    NO GROWTH 5 DAYS Performed at [facility], [address], [place], [place] [address]    Report Status 08/06/2022 FINAL  Final  CSF culture w Gram Stain     Status: None   Collection Time: 07/31/22 11:21 PM   Specimen: CSF; Cerebrospinal Fluid  Result Value Ref Range Status   Specimen Description   Final    CSF Performed at [facility], [address], [place], [place] [address]    Special Requests   Final    NONE Performed at [facility], [address] [place], [place] [address]    Gram Stain NO WBC SEEN NO ORGANISMS SEEN CYTOSPIN SMEAR   Final   Culture   Final    NO GROWTH Performed at [facility], [address], [place], [place] [address]    Report Status 08/04/2022 FINAL  Final  VZV PCR, CSF     Status: Abnormal   Collection Time: 07/31/22 11:21 PM   Specimen: Cerebrospinal Fluid  Result Value Ref Range Status   VZV PCR, CSF Positive (A) Negative Final    Comment: (NOTE) Varicella Zoster Virus DNA detected. Called to [facility] on 08/03/2022 at 18:50 ET for test(s) VZV PCR, CSF Performed At: [facility] [address] [place], [place] [id number] Jolene Schimke MD VP:[id number]   Resp panel by RT-PCR (RSV, Flu A&B, Covid) Anterior Nasal Swab     Status: None   Collection Time: 08/16/22 10:57 AM   Specimen: Anterior Nasal Swab  Result Value Ref Range Status   SARS Coronavirus 2 by RT PCR NEGATIVE NEGATIVE Final    Comment: (NOTE) SARS-CoV-2 target nucleic acids are NOT DETECTED.  The SARS-CoV-2 RNA is generally detectable in upper respiratory specimens during the acute phase of infection. The lowest concentration of SARS-CoV-2 viral copies this assay can detect is 138 copies/mL. A negative result does not preclude SARS-Cov-2 infection and should not be used as the sole basis for treatment or other patient management decisions. A negative result may occur  with  improper specimen collection/handling, submission of specimen other than nasopharyngeal swab, presence of viral mutation(s) within the areas targeted by this assay, and inadequate number of viral copies(<138 copies/mL). A negative result must be combined with clinical observations, patient history, and epidemiological information. The expected result is Negative.  Fact Sheet for Patients:  BloggerCourse.com  Fact Sheet for Healthcare Providers:  SeriousBroker.it  This test is no t yet approved or cleared by the [place] FDA and  has been authorized for detection and/or diagnosis of SARS-CoV-2 by FDA under an Emergency Use Authorization (EUA). This EUA will remain  in effect (meaning this test can be used) for the duration of the COVID-19 declaration under Section 564(b)(1) of the Act, 21 U.S.C.section 360bbb-3(b)(1), unless the authorization is terminated  or revoked sooner.       Influenza A by PCR NEGATIVE NEGATIVE Final   Influenza B by PCR NEGATIVE NEGATIVE Final    Comment: (NOTE) The Xpert Xpress SARS-CoV-2/FLU/RSV plus assay is intended as an aid in the diagnosis of influenza from Nasopharyngeal swab specimens and should not be used as a sole basis for treatment. Nasal washings and aspirates are unacceptable for Xpert Xpress SARS-CoV-2/FLU/RSV testing.  Fact Sheet for Patients: BloggerCourse.com  Fact Sheet for Healthcare Providers: SeriousBroker.it  This test is not yet approved or cleared by the [place] FDA and has been authorized for detection and/or diagnosis of SARS-CoV-2 by FDA under an Emergency Use Authorization (EUA). This EUA will remain in effect (meaning this test can be used) for the duration of the COVID-19 declaration under Section 564(b)(1) of the Act, 21 U.S.C. section 360bbb-3(b)(1), unless the authorization is terminated  or revoked.     Resp Syncytial Virus by PCR NEGATIVE NEGATIVE Final    Comment: (NOTE) Fact Sheet for Patients: BloggerCourse.com  Fact Sheet for Healthcare Providers: SeriousBroker.it  This test is not yet approved or cleared by the [place] FDA and has been authorized for detection and/or diagnosis of SARS-CoV-2 by FDA under an Emergency Use Authorization (EUA). This EUA will remain in effect (meaning this test can be used) for the duration of the COVID-19 declaration under Section 564(b)(1) of the Act, 21 U.S.C. section 360bbb-3(b)(1), unless the authorization is terminated or revoked.  Performed at [facility], [address], [place], [place] [address]   Respiratory (~20 pathogens) panel  by PCR     Status: Abnormal   Collection Time: 08/16/22 10:57 AM   Specimen: Nasopharyngeal Swab; Respiratory  Result Value Ref Range Status   Adenovirus NOT DETECTED NOT DETECTED Final   Coronavirus 229E NOT DETECTED NOT DETECTED Final    Comment: (NOTE) The Coronavirus on the Respiratory Panel, DOES NOT test for the novel  Coronavirus (2019 nCoV)    Coronavirus HKU1 NOT DETECTED NOT DETECTED Final   Coronavirus NL63 NOT DETECTED NOT DETECTED Final   Coronavirus OC43 NOT DETECTED NOT DETECTED Final   Metapneumovirus NOT DETECTED NOT DETECTED Final   Rhinovirus / Enterovirus NOT DETECTED NOT DETECTED Final   Influenza A NOT DETECTED NOT DETECTED Final   Influenza B NOT DETECTED NOT DETECTED Final   Parainfluenza Virus 1 NOT DETECTED NOT DETECTED Final   Parainfluenza Virus 2 NOT DETECTED NOT DETECTED Final   Parainfluenza Virus 3 NOT DETECTED NOT DETECTED Final   Parainfluenza Virus 4 NOT DETECTED NOT DETECTED Final   Respiratory Syncytial Virus NOT DETECTED NOT DETECTED Final   Bordetella pertussis NOT DETECTED NOT DETECTED Final   Bordetella Parapertussis NOT DETECTED NOT DETECTED Final   Chlamydophila pneumoniae  NOT DETECTED NOT DETECTED Final   Mycoplasma pneumoniae DETECTED (A) NOT DETECTED Final    Comment: Performed at [facility], [address], [place], [place] [address]    Labs: CBC: Recent Labs  Lab 08/11/22 0627  WBC 5.5  HGB 13.1  HCT 38.0  MCV 92.9  PLT 811   Basic Metabolic Panel: Recent Labs  Lab 08/11/22 0627 08/14/22 0300 08/15/22 0440 08/16/22 0520  NA 140 139  --  137  K 3.4* 3.3*  --  3.5  CL 110 107  --  110  CO2 22 24  --  20*  GLUCOSE 114* 84  --  89  BUN 8 5*  --  7  CREATININE 0.78 0.94  --  0.99  CALCIUM 8.6* 7.9*  --  8.0*  MG  --   --  2.3  --    Liver Function Tests: No results for input(s): "AST", "ALT", "ALKPHOS", "BILITOT", "PROT", "ALBUMIN" in the last 168 hours. CBG: No results for input(s): "GLUCAP" in the last 168 hours.  Discharge time spent: greater than 30 minutes.  Signed: Loletha Grayer, MD [facility] 08/17/2022

## 2022-08-17 NOTE — Progress Notes (Addendum)
Pt was administered robitussin DM at 0507 PRN but per pt report not working. NP Ky Barban MD [place] made aware. Will continue to monitor.  Update 0714: MD [place] placed order. Will continue to monitor.

## 2022-08-21 ENCOUNTER — Ambulatory Visit: Payer: Self-pay

## 2022-08-21 NOTE — Telephone Encounter (Signed)
Message from Loma [place] sent at 08/21/2022  2:57 PM EST  Summary: pt wants to speak with a nurse re symptoms at end of antibodic treatment   Pt was seen and admitted with shingles in right eye, sent home,came back worse, had developed meningitis was in hospital 18 days, went home, got pneumonia, given antibiotics for that and now no better at all at the end of course. Pt has a very suppressed immune system disorder. Does she go back to the hospital? FU at [phone]         Called pt PCP office and advised them of pt call with her concern. Pt has f/u tomorrow with PCP. Was advised that they will call pt to triage and advise. Asked for a note to be sent to me via EPIC to make sure f/u was done. Pt sought care at [facility].   Called pt and LM on VM to call back to speak to nurse.

## 2022-08-21 NOTE — Telephone Encounter (Addendum)
Summary: pt wants to speak with a nurse re symptoms at end of antibodic treatment   Pt was seen and admitted with shingles in right eye, sent home,came back worse, had developed meningitis was in hospital 18 days, went home, got pneumonia, given antibiotics for that and now no better at all at the end of course. Pt has a very suppressed immune system disorder. Does she go back to the hospital? FU at [phone]        Called patient on #[phone] to review sx . No answer. LVMTCB if needed #[phone].

## 2022-08-31 NOTE — Progress Notes (Signed)
[facility]  PATIENT: Sara Chapman DOB: [birth year]  REFERRING CLINICIAN: Bhagat, Dutch Quint, MD HISTORY FROM: self, sister REASON FOR VISIT: viral meningitis, headaches   HISTORICAL  CHIEF COMPLAINT:  Chief Complaint  Patient presents with   New Patient (Initial Visit)    Rm 2 with sister- here to discuss migraines/headaches.     HISTORY OF PRESENT ILLNESS:  The patient presents for hospital follow up of viral meningitis and headaches. At the end of December she woke up with a severe headache on the right side of her head. Headache continued to worsen so she went to Urgent Care where she was found to have a rash around her eye. She presented to the ED where she was given antibiotics for a presumed bacterial infection in her eye. Her eye continued to swell, so she presented to the ED a second time and was given eye drops. The next day her eye was completely swollen shut and her headache was debilitating. She re-presented to the ED on 07/31/22 and was diagnosed with shingles in her eye. She was noted to be confused and had a fever, so LP was done. 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 MRI brain was unremarkable. MRI orbits showed right periorbital soft tissue thickening and hyperenhancement. She was treated with IV acyclovir. Fever, rash, and confusion improved, however she continued to have headaches and was treated with Dilaudid, Lyrica, Depakote, Toradol, Fioricet, Imitrex, and verapamil (also for BP control). Imitrex caused palpitations and chest tightness.  Currently she has a residual constant right-sided headache and brain fog. Headache can still be debilitating at times. It is worse with coughing. Headache is associated with photophobia, phonophobia, and nausea at its worst. She currently takes Excedrin or Tylenol as needed. She also reports numbness and paresthesias (like bugs crawling) over her right forehead. This is improved somewhat with Lyrica.   In terms of brain  fog, she has been having trouble remembering her password and remembering which apps to use on her phone. No fevers for the past week.  She does have a history of chronic migraines prior to this. Takes Topamax 25 mg QHS for migraines which has not been particularly effective. She tried higher doses previously but this caused bothersome paresthesias.  Previous headache therapies: Topamax, Verapamil, amlodipine, Wellbutrin, Imitrex, Fioricet, Depakote  OTHER MEDICAL CONDITIONS: HTN, migraines, psoriatic arthritis in infliximab, anxiety, depression   REVIEW OF SYSTEMS: Full 14 system review of systems performed and negative with exception of: headaches, brain fog  ALLERGIES: Allergies  Allergen Reactions   Clonazepam Other (See Comments)    SUICIDAL THOUGHTS     Ibuprofen Other (See Comments)    stomach ulcers    Nsaids Other (See Comments)    GI ulcers   Tolmetin Other (See Comments)    GI ulcers    Compazine [Prochlorperazine]     Delusional unable to tolerate.     Pseudoephedrine Hcl Palpitations and Other (See Comments)        Sumatriptan Palpitations    Felt like she was dying while on med   Tape Rash    Adhesive tape breaks down the skin and causes sores, use paper tape    HOME MEDICATIONS: Outpatient Medications Prior to Visit  Medication Sig Dispense Refill   amLODipine (NORVASC) 5 MG tablet Take by mouth.     buPROPion (WELLBUTRIN XL) 150 MG 24 hr tablet Take 150 mg by mouth daily.     busPIRone (BUSPAR) 10 MG tablet Take 10 mg by  mouth 2 (two) times daily.     calcium citrate (CALCITRATE - DOSED IN MG ELEMENTAL CALCIUM) 950 (200 Ca) MG tablet Take 500 mg by mouth 3 (three) times daily.     chlorpheniramine-HYDROcodone (TUSSIONEX) 10-8 MG/5ML Take 5 mLs by mouth every 12 (twelve) hours as needed for cough. 70 mL 0   folic acid (FOLVITE) 1 MG tablet Take 1 mg by mouth daily.     Multiple Vitamin (MULTIVITAMIN) tablet Take 1 tablet by mouth daily.     pantoprazole  (PROTONIX) 40 MG tablet Take 1 tablet (40 mg total) by mouth daily. 90 tablet 0   pregabalin (LYRICA) 150 MG capsule Take 1 capsule (150 mg total) by mouth 2 (two) times daily. 60 capsule 0   topiramate (TOPAMAX) 25 MG tablet Take 1 tablet (25 mg total) by mouth at bedtime. 30 tablet 0   traZODone (DESYREL) 50 MG tablet Take 50 mg by mouth at bedtime.     VITAMIN D PO Take 1 tablet by mouth daily.     inFLIXimab (REMICADE IV) Inject into the vein. (Patient not taking: Reported on 09/01/2022)     azithromycin (ZITHROMAX) 250 MG tablet One tab po daily for 3 days 3 each 0   sertraline (ZOLOFT) 50 MG tablet Take 50 mg by mouth daily. (Patient not taking: Reported on 09/01/2022)     SUMAtriptan (IMITREX) 50 MG tablet Take 1 tablet (50 mg total) by mouth every 12 (twelve) hours for 4 doses. May repeat in 2 hours if headache persists or recurs. 4 tablet 0   verapamil (CALAN-SR) 120 MG CR tablet Take 1 tablet (120 mg total) by mouth daily. 30 tablet 0   No facility-administered medications prior to visit.    PAST MEDICAL HISTORY: Past Medical History:  Diagnosis Date   Anemia    H/O   Anxiety    Bowel perforation (HCC)    Complication of anesthesia    WOKE UP DURING PLACEMENT OF JP DRAIN AND WAS IN SEVERE PAIN FEELING DRAIN PLACEMENT   GERD (gastroesophageal reflux disease)    Headache    MIGRAINES   History of kidney stones    X1   Hypertension    PONV (postoperative nausea and vomiting)    WITH C-SECTION   Psoriatic arthritis (HCC)    Sleep apnea    CPAP   Stomach ulcer     PAST SURGICAL HISTORY: Past Surgical History:  Procedure Laterality Date   CESAREAN SECTION     X2   COLON SURGERY     CYSTOSCOPY N/A 11/19/2019   Procedure: CYSTOSCOPY;  Surgeon: [name], MD;  Location: [facility] ORS;  Service: Urology;  Laterality: N/A;   HIATAL HERNIA REPAIR N/A 01/23/2022   Procedure: HERNIA REPAIR HIATAL;  Surgeon: Berna Bue, MD;  Location: [facility];  Service: General;   Laterality: N/A;   LAPAROSCOPIC GASTRIC SLEEVE RESECTION N/A 01/23/2022   Procedure: LAPAROSCOPIC SLEEVE GASTRECTOMY;  Surgeon: Berna Bue, MD;  Location: [facility];  Service: General;  Laterality: N/A;   UPPER GI ENDOSCOPY N/A 01/23/2022   Procedure: UPPER GI ENDOSCOPY;  Surgeon: Berna Bue, MD;  Location: [facility];  Service: General;  Laterality: N/A;    FAMILY HISTORY: No family history on file.  SOCIAL HISTORY: Social History   Socioeconomic History   Marital status: Married    Spouse name: Not on file   Number of children: Not on file   Years of education: Not on file   Highest education level:  Not on file  Occupational History   Not on file  Tobacco Use   Smoking status: Never   Smokeless tobacco: Never  Vaping Use   Vaping Use: Never used  Substance and Sexual Activity   Alcohol use: Yes    Comment: very rare   Drug use: Never   Sexual activity: Not on file  Other Topics Concern   Not on file  Social History Narrative   Not on file   Social Determinants of Health   Financial Resource Strain: Not on file  Food Insecurity: No Food Insecurity (08/02/2022)   Hunger Vital Sign    Worried About Running Out of Food in the Last Year: Never true    Ran Out of Food in the Last Year: Never true  Transportation Needs: No Transportation Needs (08/02/2022)   PRAPARE - Hydrologist (Medical): No    Lack of Transportation (Non-Medical): No  Physical Activity: Not on file  Stress: Not on file  Social Connections: Not on file  Intimate Partner Violence: Not At Risk (08/02/2022)   Humiliation, Afraid, Rape, and Kick questionnaire    Fear of Current or Ex-Partner: No    Emotionally Abused: No    Physically Abused: No    Sexually Abused: No     PHYSICAL EXAM  GENERAL EXAM/CONSTITUTIONAL: Vitals:  Vitals:   09/01/22 1040  BP: 128/88  Pulse: 84  Weight: 135 lb (61.2 kg)  Height: 4\' 10"  (1.473 m)   Body mass index is 28.22 kg/m. Wt  Readings from Last 3 Encounters:  09/01/22 135 lb (61.2 kg)  07/31/22 144 lb (65.3 kg)  07/30/22 144 lb (65.3 kg)   NEUROLOGIC: MENTAL STATUS:  awake, alert, oriented to person, place and time recent and remote memory intact normal attention and concentration language fluent, comprehension intact  CRANIAL NERVE: 2nd, 3rd, 4th, 6th - pupils equal and reactive to light, visual fields full to confrontation, extraocular muscles intact, no nystagmus 5th - decreased sensation right V1 7th - facial strength symmetric 8th - hearing intact 9th - palate elevates symmetrically, uvula midline 11th - shoulder shrug symmetric 12th - tongue protrusion midline  MOTOR:  normal bulk and tone, full strength in the BUE, BLE  SENSORY:  normal and symmetric to light touch all 4 extremities  COORDINATION:  finger-nose-finger, fine finger movements normal  REFLEXES:  deep tendon reflexes present and symmetric  GAIT/STATION:  normal     DIAGNOSTIC DATA (LABS, IMAGING, TESTING) - I reviewed patient records, labs, notes, testing and imaging myself where available.  Lab Results  Component Value Date   WBC 5.5 08/11/2022   HGB 13.1 08/11/2022   HCT 38.0 08/11/2022   MCV 92.9 08/11/2022   PLT 228 08/11/2022      Component Value Date/Time   NA 137 08/16/2022 0520   NA 137 04/21/2013 1027   K 3.5 08/16/2022 0520   K 3.7 04/21/2013 1027   CL 110 08/16/2022 0520   CL 106 04/21/2013 1027   CO2 20 (L) 08/16/2022 0520   CO2 20 (L) 04/21/2013 1027   GLUCOSE 89 08/16/2022 0520   GLUCOSE 90 04/21/2013 1027   BUN 7 08/16/2022 0520   BUN 8 04/21/2013 1027   CREATININE 0.99 08/16/2022 0520   CREATININE 0.86 04/21/2013 1027   CALCIUM 8.0 (L) 08/16/2022 0520   CALCIUM 9.0 04/21/2013 1027   PROT 7.0 07/31/2022 2057   PROT 8.3 (H) 04/21/2013 1027   ALBUMIN 3.7 07/31/2022 2057   ALBUMIN  2.9 (L) 04/21/2013 1027   AST 20 07/31/2022 2057   AST 16 04/21/2013 1027   ALT 28 07/31/2022 2057   ALT  22 04/21/2013 1027   ALKPHOS 62 07/31/2022 2057   ALKPHOS 161 (H) 04/21/2013 1027   BILITOT 0.7 07/31/2022 2057   BILITOT 0.4 04/21/2013 1027   GFRNONAA >60 08/16/2022 0520   GFRNONAA >60 04/21/2013 1027   GFRAA >60 11/24/2019 0659   GFRAA >60 04/21/2013 1027   No results found for: "CHOL", "HDL", "LDLCALC", "LDLDIRECT", "TRIG", "CHOLHDL" Lab Results  Component Value Date   HGBA1C 4.7 (L) 11/17/2019   No results found for: "VITAMINB12" No results found for: "TSH"    ASSESSMENT AND PLAN  47 y.o. year old female with a history of HTN, migraines, psoriatic arthritis in infliximab, anxiety, depression who presents for evaluation of headaches following recent VZV meningitis. While her rash and fevers have resolved, she continues to have residual brain fog and headaches. Will order CTA head to rule out vascular involvement of VZV as she does report a persistent headache which is worse with cough/valsalva. She has failed multiple preventive medications for her headaches at this point. Will start Emgality for headache/migraine prevention and naratriptan for rescue.   1. Vascular headache   2. Intractable chronic migraine without aura and without status migrainosus       PLAN: -CTA head -Prevention: Stop Topamax. Start Emgality 120 mg monthly -Rescue: Start naratriptan 2.5 mg PRN  Orders Placed This Encounter  Procedures   CT ANGIO HEAD W OR WO CONTRAST    Meds ordered this encounter  Medications   naratriptan (AMERGE) 2.5 MG tablet    Sig: Take 1 tablet (2.5 mg total) by mouth as needed for migraine. Take one (1) tablet at onset of headache; if returns or does not resolve, may repeat after 4 hours; do not exceed five (5) mg in 24 hours.    Dispense:  10 tablet    Refill:  0   Galcanezumab-gnlm (EMGALITY) 120 MG/ML SOAJ    Sig: Inject 1 Pen into the skin every 30 (thirty) days.    Dispense:  1.12 mL    Refill:  5   Galcanezumab-gnlm (EMGALITY) 120 MG/ML SOAJ    Sig: Inject  2 Pens into the skin once for 1 dose. First dose only    Dispense:  2.24 mL    Refill:  0    Return in about 6 months (around 03/02/2023).    Genia Harold, MD 09/01/22 11:45 AM  I spent an average of 61 minutes chart reviewing and counseling the patient, with at least 50% of the time face to face with the patient.   [facility] [address] [place], [place] [address] [phone]

## 2022-09-01 ENCOUNTER — Ambulatory Visit (INDEPENDENT_AMBULATORY_CARE_PROVIDER_SITE_OTHER): Payer: BC Managed Care – PPO | Admitting: Psychiatry

## 2022-09-01 VITALS — BP 128/88 | HR 84 | Ht <= 58 in | Wt 135.0 lb

## 2022-09-01 DIAGNOSIS — G441 Vascular headache, not elsewhere classified: Secondary | ICD-10-CM

## 2022-09-01 DIAGNOSIS — G43719 Chronic migraine without aura, intractable, without status migrainosus: Secondary | ICD-10-CM

## 2022-09-01 MED ORDER — EMGALITY 120 MG/ML ~~LOC~~ SOAJ: 2.0000 | Freq: Once | SUBCUTANEOUS | 0 refills | Status: AC

## 2022-09-01 MED ORDER — EMGALITY 120 MG/ML ~~LOC~~ SOAJ: 1.0000 | SUBCUTANEOUS | 5 refills | Status: DC

## 2022-09-01 MED ORDER — NARATRIPTAN HCL 2.5 MG PO TABS: 2.5000 mg | ORAL_TABLET | ORAL | 0 refills | Status: DC | PRN

## 2022-09-01 NOTE — Patient Instructions (Signed)
Plan:  CTA of the head to look at blood vessels in the brain Stop Topamax. Start Emgality for migraine prevention Start naratriptan as needed for migraine. Take one pill at onset of migraine, may repeat a dose in 4 hours if headache persists

## 2022-09-05 ENCOUNTER — Telehealth: Payer: Self-pay | Admitting: Psychiatry

## 2022-09-05 NOTE — Telephone Encounter (Signed)
[employer] Josem Kaufmann: [id number] exp. 09/05/22-10/04/22 sent to GI [phone]

## 2022-09-14 ENCOUNTER — Telehealth: Payer: Self-pay | Admitting: Pharmacy Technician

## 2022-09-14 ENCOUNTER — Telehealth (HOSPITAL_COMMUNITY): Payer: Self-pay | Admitting: Pharmacy Technician

## 2022-09-14 NOTE — Telephone Encounter (Signed)
Patient Advocate Encounter   Received notification that prior authorization for Emgality 120MG/ML auto-injectors (migraine) is required.   PA submitted on 09/14/2022 Key [id number] Status is pending       Lyndel Safe, [place] Patient Advocate Specialist [place] Patient Advocate Team Direct Number: [phone]  Fax: [phone]

## 2022-09-14 NOTE — Telephone Encounter (Signed)
Error

## 2022-09-15 NOTE — Telephone Encounter (Signed)
Patient Advocate Encounter  Prior Authorization for [employer] 120MG/ML auto-injectors (migraine) has been approved.    PA# [id number] Effective dates: 09/14/2022 through 10/12/2022      Lyndel Safe, [place] Patient Advocate Specialist [place] Patient Advocate Team Direct Number: [phone]  Fax: [phone]

## 2022-09-20 ENCOUNTER — Ambulatory Visit
Admission: RE | Admit: 2022-09-20 | Discharge: 2022-09-20 | Disposition: A | Discharge status: Home / Self Care | Payer: BC Managed Care – PPO | Source: Ambulatory Visit | Attending: Psychiatry | Admitting: Psychiatry

## 2022-09-20 DIAGNOSIS — G441 Vascular headache, not elsewhere classified: Secondary | ICD-10-CM

## 2022-09-20 MED ORDER — IOPAMIDOL (ISOVUE-370) INJECTION 76%
75.0000 mL | Freq: Once | INTRAVENOUS | Status: AC | PRN
  Administered 2022-09-20: 75 mL via INTRAVENOUS

## 2022-09-25 ENCOUNTER — Encounter: Payer: Self-pay | Admitting: Psychiatry

## 2022-09-27 NOTE — Telephone Encounter (Signed)
Per Dr. Billey Gosling:   "CTA shows a very small (1 mm) widening of one of her blood vessels. This can either represent a very small aneurysm or it may be an infundibulum, which is a benign widening of the blood vessel that does not cause symptoms. It is so small that we cannot tell for sure based on this scan. This finding is likely incidental and not related to her recent infection. I'll plan to repeat the CTA in one year to monitor this and make sure it does not change in size.  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 Sometimes we can see this with high spinal fluid pressure, but we know her pressure is normal based on the spinal tap she had in the hospital. Since her spinal fluid pressure was normal, this is likely just a benign incidental finding. Otherwise her CT scan is normal. Patient requesting she be called with the results, thanks "    Called pt at [phone]. LVM for her to call office. Also sent mychart message.

## 2022-10-30 ENCOUNTER — Other Ambulatory Visit: Payer: Self-pay

## 2022-10-30 MED ORDER — NARATRIPTAN HCL 2.5 MG PO TABS: 2.5000 mg | ORAL_TABLET | ORAL | 3 refills | Status: DC | PRN

## 2022-12-12 ENCOUNTER — Other Ambulatory Visit (HOSPITAL_COMMUNITY): Payer: Self-pay

## 2023-01-26 ENCOUNTER — Telehealth: Payer: Self-pay

## 2023-01-26 ENCOUNTER — Other Ambulatory Visit (HOSPITAL_COMMUNITY): Payer: Self-pay

## 2023-01-26 NOTE — Telephone Encounter (Signed)
   PT has not been evaluated after trying Emgality for 3 months, also need update on number of headache days and migraine days PT is having. Please advise.

## 2023-01-29 NOTE — Telephone Encounter (Signed)
LMVM for pt that we need is she has had reduction of her headache and migraine days per month. She can mychart [place] as well. This relating to her emgality if it is working for her. Insurance is asking to know if effective.

## 2023-01-30 ENCOUNTER — Emergency Department: Payer: BC Managed Care – PPO

## 2023-01-30 ENCOUNTER — Emergency Department
Admission: EM | Admit: 2023-01-30 | Discharge: 2023-01-30 | Disposition: A | Discharge status: Home / Self Care | Payer: BC Managed Care – PPO | Attending: Emergency Medicine | Admitting: Emergency Medicine

## 2023-01-30 ENCOUNTER — Other Ambulatory Visit: Payer: Self-pay

## 2023-01-30 DIAGNOSIS — R1010 Upper abdominal pain, unspecified: Secondary | ICD-10-CM | POA: Diagnosis present

## 2023-01-30 DIAGNOSIS — K529 Noninfective gastroenteritis and colitis, unspecified: Secondary | ICD-10-CM | POA: Diagnosis not present

## 2023-01-30 DIAGNOSIS — R531 Weakness: Secondary | ICD-10-CM | POA: Insufficient documentation

## 2023-01-30 LAB — CBC
HCT: 42.5 % (ref 36.0–46.0)
Hemoglobin: 14.7 g/dL (ref 12.0–15.0)
MCH: 31.7 pg (ref 26.0–34.0)
MCHC: 34.6 g/dL (ref 30.0–36.0)
MCV: 91.8 fL (ref 80.0–100.0)
Platelets: 252 10*3/uL (ref 150–400)
RBC: 4.63 MIL/uL (ref 3.87–5.11)
RDW: 12.6 % (ref 11.5–15.5)
WBC: 9 10*3/uL (ref 4.0–10.5)
nRBC: 0 % (ref 0.0–0.2)

## 2023-01-30 LAB — PREGNANCY, URINE: Preg Test, Ur: NEGATIVE

## 2023-01-30 LAB — COMPREHENSIVE METABOLIC PANEL
ALT: 49 U/L — ABNORMAL HIGH (ref 0–44)
AST: 40 U/L (ref 15–41)
Albumin: 3.3 g/dL — ABNORMAL LOW (ref 3.5–5.0)
Alkaline Phosphatase: 80 U/L (ref 38–126)
Anion gap: 7 (ref 5–15)
BUN: 14 mg/dL (ref 6–20)
CO2: 25 mmol/L (ref 22–32)
Calcium: 8.2 mg/dL — ABNORMAL LOW (ref 8.9–10.3)
Chloride: 105 mmol/L (ref 98–111)
Creatinine, Ser: 0.94 mg/dL (ref 0.44–1.00)
GFR, Estimated: >60 mL/min (ref >60)
Glucose, Bld: 95 mg/dL (ref 70–99)
Potassium: 3.1 mmol/L — ABNORMAL LOW (ref 3.5–5.1)
Sodium: 137 mmol/L (ref 135–145)
Total Bilirubin: 1.2 mg/dL (ref 0.3–1.2)
Total Protein: 5.9 g/dL — ABNORMAL LOW (ref 6.5–8.1)

## 2023-01-30 LAB — URINALYSIS, ROUTINE W REFLEX MICROSCOPIC
Bilirubin Urine: NEGATIVE
Glucose, UA: NEGATIVE mg/dL
Hgb urine dipstick: NEGATIVE
Ketones, ur: 5 mg/dL — AB
Leukocytes,Ua: NEGATIVE
Nitrite: NEGATIVE
Protein, ur: NEGATIVE mg/dL
Specific Gravity, Urine: 1.014 (ref 1.005–1.030)
pH: 6 (ref 5.0–8.0)

## 2023-01-30 LAB — LIPASE, BLOOD: Lipase: 34 U/L (ref 11–51)

## 2023-01-30 MED ORDER — ONDANSETRON 4 MG PO TBDP: 4.0000 mg | ORAL_TABLET | Freq: Three times a day (TID) | ORAL | 0 refills | Status: DC | PRN

## 2023-01-30 MED ORDER — SODIUM CHLORIDE 0.9 % IV SOLN: Freq: Once | INTRAVENOUS | Status: AC

## 2023-01-30 MED ORDER — IOHEXOL 300 MG/ML  SOLN
100.0000 mL | Freq: Once | INTRAMUSCULAR | Status: AC | PRN
  Administered 2023-01-30: 100 mL via INTRAVENOUS

## 2023-01-30 MED ORDER — ONDANSETRON HCL 4 MG/2ML IJ SOLN
4.0000 mg | Freq: Once | INTRAMUSCULAR | Status: AC
  Administered 2023-01-30: 4 mg via INTRAVENOUS
  Filled 2023-01-30: qty 2

## 2023-01-30 NOTE — ED Notes (Signed)
Patient reports she is feeling better after zofran and fluids

## 2023-01-30 NOTE — ED Provider Notes (Signed)
[facility] Provider Note    Event Date/Time   First MD Initiated Contact with Patient 01/30/23 1246     (approximate)   History   Weakness   HPI  Ema Sines is a 47 y.o. female with a history of gastric sleeve who presents with upper abdominal pain, nausea vomiting and some diarrhea over the last 2 to 3 days.  She feels weak.  She also reports body aches.     Physical Exam   Triage Vital Signs: ED Triage Vitals  Enc Vitals Group     BP 01/30/23 1218 (!) 159/108     Pulse Rate 01/30/23 1218 71     Resp 01/30/23 1218 17     Temp 01/30/23 1220 98.5 F (36.9 C)     Temp Source 01/30/23 1218 Oral     SpO2 01/30/23 1218 98 %     Weight 01/30/23 1221 61.2 kg (134 lb 14.7 oz)     Height 01/30/23 1221 1.473 m (4\' 10" )     Head Circumference --      Peak Flow --      Pain Score 01/30/23 1221 7     Pain Loc --      Pain Edu? --      Excl. in GC? --     Most recent vital signs: Vitals:   01/30/23 1218 01/30/23 1220  BP: (!) 159/108   Pulse: 71   Resp: 17   Temp:  98.5 F (36.9 C)  SpO2: 98%      General: Awake, no distress.  CV:  Good peripheral perfusion.  Resp:  Normal effort.  Abd:  No distention.  Soft, mild tenderness left upper quadrant, right upper quadrant Other:     ED Results / Procedures / Treatments   Labs (all labs ordered are listed, but only abnormal results are displayed) Labs Reviewed  COMPREHENSIVE METABOLIC PANEL - Abnormal; Notable for the following components:      Result Value   Potassium 3.1 (*)    Calcium 8.2 (*)    Total Protein 5.9 (*)    Albumin 3.3 (*)    ALT 49 (*)    All other components within normal limits  URINALYSIS, ROUTINE W REFLEX MICROSCOPIC - Abnormal; Notable for the following components:   Color, Urine YELLOW (*)    APPearance CLEAR (*)    Ketones, ur 5 (*)    All other components within normal limits  LIPASE, BLOOD  CBC  PREGNANCY, URINE  POC URINE PREG, ED      EKG     RADIOLOGY CT scan viewed interpret by me, no acute normality, pending radiology review    PROCEDURES:  Critical Care performed:   Procedures   MEDICATIONS ORDERED IN ED: Medications  0.9 %  sodium chloride infusion (0 mLs Intravenous Stopped 01/30/23 1452)  ondansetron (ZOFRAN) injection 4 mg (4 mg Intravenous Given 01/30/23 1323)  iohexol (OMNIPAQUE) 300 MG/ML solution 100 mL (100 mLs Intravenous Contrast Given 01/30/23 1510)     IMPRESSION / MDM / ASSESSMENT AND PLAN / ED COURSE  I reviewed the triage vital signs and the nursing notes. Patient's presentation is most consistent with acute presentation with potential threat to life or bodily function.  Patient presents with abdominal discomfort, nausea vomiting in the setting of gastric sleeve.  Differential includes gastroenteritis, gastritis, small bowel obstruction  IV fluids, IV Zofran, obtain CT abdomen pelvis  \Lab work reviewed demonstrates mildly elevated white blood cell count.  CT scan is reassuring, patient is feeling improved after fluids and Zofran, suspect viral gastroenteritis, no indication for admission at this time, appropriate for discharge with outpatient follow-up as needed, return precautions discussed.      FINAL CLINICAL IMPRESSION(S) / ED DIAGNOSES   Final diagnoses:  Gastroenteritis     Rx / DC Orders   ED Discharge Orders          Ordered    ondansetron (ZOFRAN-ODT) 4 MG disintegrating tablet  Every 8 hours PRN        01/30/23 1614             Note:  This document was prepared using Dragon voice recognition software and may include unintentional dictation errors.   Jene Every, MD 01/30/23 [phone]

## 2023-01-30 NOTE — Telephone Encounter (Signed)
I called pt LMVM to return call.   °

## 2023-01-30 NOTE — Telephone Encounter (Signed)
I sent mychart to pt.

## 2023-01-30 NOTE — ED Triage Notes (Signed)
Pt here with weakness since Fri. Pt also has upper abd pain and is having constant nausea. Pt denies diarrhea.

## 2023-01-31 ENCOUNTER — Other Ambulatory Visit (HOSPITAL_COMMUNITY): Payer: Self-pay

## 2023-01-31 NOTE — Telephone Encounter (Signed)
Pharmacy Patient Advocate Encounter  Prior Authorization for Emgality 120MG /ML auto-injectors has been APPROVED by [employer] from 01/31/2023 to 01/31/2024.   PA # PA Case: [id number]  Spoke with Pharmacy to process. Copay is $21.00 per [facility] test claim for a 28DS/1 ML.

## 2023-01-31 NOTE — Telephone Encounter (Signed)
Pharmacy Patient Advocate Encounter   Received notification from [facility] that prior authorization for Emgality 120MG /ML auto-injectors is required/requested.   PA submitted to [employer] via CoverMyMeds Key or [facility]) confirmation # [id number] Status is pending

## 2023-03-28 ENCOUNTER — Encounter: Payer: Self-pay | Admitting: Psychiatry

## 2023-03-28 ENCOUNTER — Ambulatory Visit: Payer: BC Managed Care – PPO | Admitting: Psychiatry

## 2023-03-28 NOTE — Progress Notes (Deleted)
   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 MRI brain 08/02/22 showed right periorbital soft tissue thickening and enhancement. 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  At her last visit she was started on Emgality for migraine prevention and naratriptan for rescue.  Interval History: Headaches***emgality***naratriptan***  Headache days per month: *** Migraine days per month*** Headache free days per month: ***  Current Headache Regimen: Preventative: *** Abortive: ***   Prior Therapies                                  Topamax 25 mg at bedtime - paresthesias Depakote Verapamil Amlodipine Wellbutrin Imitrex Fioricet  Physical Exam:   Vital Signs: There were no vitals taken for this visit. GENERAL:  well appearing, in no acute distress, alert  SKIN:  Color, texture, turgor normal. No rashes or lesions HEAD:  Normocephalic/atraumatic. RESP: normal respiratory effort MSK:  No gross joint deformities.   NEUROLOGICAL: Mental Status: Alert, oriented to person, place and time, Follows commands, and Speech fluent and appropriate. Cranial Nerves: PERRL, face symmetric, no dysarthria, hearing grossly intact Motor: moves all extremities equally Gait: normal-based.  IMPRESSION: ***  PLAN: *** -repeat CTA February 2025  Follow-up: ***  I spent a total of *** minutes on the date of the service. Headache education was done. Discussed lifestyle modification including increased oral hydration, decreased caffeine, exercise and stress management. Discussed treatment options including preventive and acute medications, natural supplements, and infusion therapy. Discussed medication overuse headache and to limit use of acute  treatments to no more than 2 days/week or 10 days/month. Discussed medication side effects, adverse reactions and drug interactions. Written educational materials and patient instructions outlining all of the above were given.  Ocie Doyne, MD

## 2023-06-07 ENCOUNTER — Other Ambulatory Visit: Payer: Self-pay | Admitting: *Deleted

## 2023-06-07 MED ORDER — EMGALITY 120 MG/ML ~~LOC~~ SOAJ: 1.0000 | SUBCUTANEOUS | 0 refills | Status: DC

## 2023-06-07 NOTE — Telephone Encounter (Signed)
Last seen on 09/23/22 No follow up visit scheduled

## 2023-07-10 ENCOUNTER — Telehealth: Payer: Self-pay | Admitting: Psychiatry

## 2023-07-10 NOTE — Telephone Encounter (Signed)
Noted  

## 2023-07-10 NOTE — Telephone Encounter (Signed)
Called pt. LVM offering 07/12/23 at 9am with Dr. Epimenio Foot. Asked she call back before 5pm today to let us know if this works

## 2023-07-10 NOTE — Telephone Encounter (Signed)
Pt said over the last 45 days having memory loss, cannot remember doing something in the last 10 minutes,forgot appointment, repeat things over and over again, driving and can forget where I am going, have had a on going headache for a wee Have been taking  Galcanezumab-gnlm (EMGALITY) 120 MG/ML SOAJ as prescribed. Would like call back.  Did not see in the chart where patient had been seen for memory loss. Informed pt would need a referral for memory loss diagnosis.

## 2023-07-10 NOTE — Telephone Encounter (Signed)
Pt called to accept appt for 12/12. Scheduled- Morrie Sheldon

## 2023-07-12 ENCOUNTER — Ambulatory Visit (INDEPENDENT_AMBULATORY_CARE_PROVIDER_SITE_OTHER): Payer: BC Managed Care – PPO | Admitting: Neurology

## 2023-07-12 ENCOUNTER — Other Ambulatory Visit: Payer: Self-pay | Admitting: Neurology

## 2023-07-12 ENCOUNTER — Encounter: Payer: Self-pay | Admitting: Neurology

## 2023-07-12 VITALS — BP 122/83 | HR 67 | Ht <= 58 in | Wt 137.5 lb

## 2023-07-12 DIAGNOSIS — G441 Vascular headache, not elsewhere classified: Secondary | ICD-10-CM

## 2023-07-12 DIAGNOSIS — G4733 Obstructive sleep apnea (adult) (pediatric): Secondary | ICD-10-CM

## 2023-07-12 DIAGNOSIS — B021 Zoster meningitis: Secondary | ICD-10-CM

## 2023-07-12 DIAGNOSIS — F419 Anxiety disorder, unspecified: Secondary | ICD-10-CM

## 2023-07-12 DIAGNOSIS — R413 Other amnesia: Secondary | ICD-10-CM | POA: Diagnosis not present

## 2023-07-12 DIAGNOSIS — F32A Depression, unspecified: Secondary | ICD-10-CM

## 2023-07-12 DIAGNOSIS — E236 Other disorders of pituitary gland: Secondary | ICD-10-CM

## 2023-07-12 MED ORDER — AMITRIPTYLINE HCL 25 MG PO TABS
25.0000 mg | ORAL_TABLET | Freq: Every day | ORAL | 5 refills | Status: DC
Start: 2023-07-12 — End: 2024-01-08

## 2023-07-12 NOTE — Progress Notes (Signed)
[facility]  PATIENT: Sara Chapman DOB: 03/07/1976  REFERRING DOCTOR OR PCP: Fabienne Bruns, PA SOURCE: Patient, note Dr. [place], notes from hospital, imaging and lab reports, MRI images personally reviewed.  _________________________________   HISTORICAL  CHIEF COMPLAINT:  Chief Complaint  Patient presents with   Follow-up    Pt in room 11 alone. Here for memory loss. Pt she had shingles and meningitis earlier this year, she has brain fog noticed memory was off then, but gotten better.  Pt said in the last month and a half she notices she is very forgetful.     HISTORY OF PRESENT ILLNESS:  She is a 47 year old woman who has been seen for memory loss and headaches.   Earlier this year, she had a viral meningitis, headaches.  She also had right periorbital cellulitis that had improved when reevaluated 08/02/2022.  Since her last visit with Dr. Delena Bali, she had a CT angiogram 09/20/2022.  It did not show any occlusion or significant stenosis.  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  She began to note memory issues after the meningitis and felt it worsened further the last couple months.  She sleeps poorly and takes gabapentin and melatonin 20 mg.   She had RLS when trazodone was tried.   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    She will get out of hr bed when she can't sleep and then try to fall back asleep.    She often naps.    She snores and was diagnosed with OSA in the past.  She stopped using her CPAP after the meningitis as she could not get comfortable.  Her CPAP is > 7 years.   She uses nasal pillows.  [facility] was managing  I reviewed her last MRI of the brain and orbits in her presence.  She has an empty sella.   MRI at that time showed improving preorbital.  It is resolved today.   She notes some depression - often worse in winter.   She is on Buspar and Lexapro.       07/12/2023     9:07 AM  Montreal Cognitive Assessment   Visuospatial/ Executive (0/5) 5  Naming (0/3) 3  Attention: Read list of digits (0/2) 2  Attention: Read list of letters (0/1) 1  Attention: Serial 7 subtraction starting at 100 (0/3) 3  Language: Repeat phrase (0/2) 2  Language : Fluency (0/1) 1  Abstraction (0/2) 2  Delayed Recall (0/5) 3  Orientation (0/6) 6  Total 28     From Dr. Delena Bali 09/01/2022: At the end of December she woke up with a severe headache on the right side of her head. Headache continued to worsen so she went to Urgent Care where she was found to have a rash around her eye. She presented to the ED where she was given antibiotics for a presumed bacterial infection in her eye. Her eye continued to swell, so she presented to the ED a second time and was given eye drops. The next day her eye was completely swollen shut and her headache was debilitating. She re-presented to the ED on 07/31/22 and was diagnosed with shingles in her eye. She was noted to be confused and had a fever, so LP was done. 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 MRI brain was unremarkable. MRI orbits showed right periorbital soft tissue thickening and hyperenhancement. She was treated with IV acyclovir. Fever, rash, and  confusion improved, however she continued to have headaches and was treated with Dilaudid, Lyrica, Depakote, Toradol, Fioricet, Imitrex, and verapamil (also for BP control). Imitrex caused palpitations and chest tightness.   Currently she has a residual constant right-sided headache and brain fog. Headache can still be debilitating at times. It is worse with coughing. Headache is associated with photophobia, phonophobia, and nausea at its worst. She currently takes Excedrin or Tylenol as needed. She also reports numbness and paresthesias (like bugs crawling) over her right forehead. This is improved somewhat with Lyrica.    In terms of brain fog, she has been having trouble remembering her password and remembering  which apps to use on her phone. No fevers for the past week.   She does have a history of chronic migraines prior to this. Takes Topamax 25 mg QHS for migraines which has not been particularly effective. She tried higher doses previously but this caused bothersome paresthesias.   Previous headache therapies: Topamax, Verapamil, amlodipine, Wellbutrin, Imitrex, Fioricet, Depakote   OTHER MEDICAL CONDITIONS: HTN, migraines, psoriatic arthritis in infliximab, anxiety, depression  REVIEW OF SYSTEMS: Constitutional: No fevers, chills, sweats, or change in appetite Eyes: No visual changes, double vision, eye pain Ear, nose and throat: No hearing loss, ear pain, nasal congestion, sore throat Cardiovascular: No chest pain, palpitations Respiratory:  No shortness of breath at rest or with exertion.   No wheezes GastrointestinaI: No nausea, vomiting, diarrhea, abdominal pain, fecal incontinence Genitourinary:  No dysuria, urinary retention or frequency.  No nocturia. Musculoskeletal:  No neck pain, back pain Integumentary: No rash, pruritus, skin lesions Neurological: as above Psychiatric: No depression at this time.  No anxiety Endocrine: No palpitations, diaphoresis, change in appetite, change in weigh or increased thirst Hematologic/Lymphatic:  No anemia, purpura, petechiae. Allergic/Immunologic: No itchy/runny eyes, nasal congestion, recent allergic reactions, rashes  ALLERGIES: Allergies  Allergen Reactions   Clonazepam Other (See Comments)    SUICIDAL THOUGHTS     Ibuprofen Other (See Comments)    stomach ulcers    Nsaids Other (See Comments)    GI ulcers   Tolmetin Other (See Comments)    GI ulcers    Compazine [Prochlorperazine]     Delusional unable to tolerate.     Pseudoephedrine Hcl Palpitations and Other (See Comments)        Sumatriptan Palpitations    Felt like she was dying while on med   Tape Rash    Adhesive tape breaks down the skin and causes sores, use paper  tape    HOME MEDICATIONS:  Current Outpatient Medications:    amitriptyline (ELAVIL) 25 MG tablet, Take 1 tablet (25 mg total) by mouth at bedtime., Disp: 30 tablet, Rfl: 5   amLODipine (NORVASC) 5 MG tablet, Take by mouth., Disp: , Rfl:    buPROPion (WELLBUTRIN XL) 150 MG 24 hr tablet, Take 150 mg by mouth daily., Disp: , Rfl:    busPIRone (BUSPAR) 10 MG tablet, Take 10 mg by mouth 2 (two) times daily., Disp: , Rfl:    calcium citrate (CALCITRATE - DOSED IN MG ELEMENTAL CALCIUM) 950 (200 Ca) MG tablet, Take 500 mg by mouth 3 (three) times daily., Disp: , Rfl:    Galcanezumab-gnlm (EMGALITY) 120 MG/ML SOAJ, Inject 1 Pen into the skin every 30 (thirty) days., Disp: 1.12 mL, Rfl: 0   inFLIXimab (REMICADE IV), Inject into the vein., Disp: , Rfl:    levonorgestrel (MIRENA) 20 MCG/DAY IUD, 1 each by Intrauterine route once., Disp: , Rfl:  Multiple Vitamin (MULTIVITAMIN) tablet, Take 1 tablet by mouth daily., Disp: , Rfl:    naratriptan (AMERGE) 2.5 MG tablet, Take 1 tablet (2.5 mg total) by mouth as needed for migraine. Take one (1) tablet at onset of headache; if returns or does not resolve, may repeat after 4 hours; do not exceed five (5) mg in 24 hours., Disp: 10 tablet, Rfl: 3   pantoprazole (PROTONIX) 40 MG tablet, Take 1 tablet (40 mg total) by mouth daily., Disp: 90 tablet, Rfl: 0   VITAMIN D PO, Take 1 tablet by mouth daily., Disp: , Rfl:    chlorpheniramine-HYDROcodone (TUSSIONEX) 10-8 MG/5ML, Take 5 mLs by mouth every 12 (twelve) hours as needed for cough. (Patient not taking: Reported on 07/12/2023), Disp: 70 mL, Rfl: 0   ondansetron (ZOFRAN-ODT) 4 MG disintegrating tablet, Take 1 tablet (4 mg total) by mouth every 8 (eight) hours as needed for nausea or vomiting. (Patient not taking: Reported on 07/12/2023), Disp: 20 tablet, Rfl: 0   pregabalin (LYRICA) 150 MG capsule, Take 1 capsule (150 mg total) by mouth 2 (two) times daily. (Patient not taking: Reported on 07/12/2023), Disp: 60  capsule, Rfl: 0   topiramate (TOPAMAX) 25 MG tablet, Take 1 tablet (25 mg total) by mouth at bedtime. (Patient not taking: Reported on 07/12/2023), Disp: 30 tablet, Rfl: 0  PAST MEDICAL HISTORY: Past Medical History:  Diagnosis Date   Anemia    H/O   Anxiety    Bowel perforation (HCC)    Complication of anesthesia    WOKE UP DURING PLACEMENT OF JP DRAIN AND WAS IN SEVERE PAIN FEELING DRAIN PLACEMENT   GERD (gastroesophageal reflux disease)    Headache    MIGRAINES   History of kidney stones    X1   Hypertension    PONV (postoperative nausea and vomiting)    WITH C-SECTION   Psoriatic arthritis (HCC)    Sleep apnea    CPAP   Stomach ulcer     PAST SURGICAL HISTORY: Past Surgical History:  Procedure Laterality Date   CESAREAN SECTION     X2   COLON SURGERY     CYSTOSCOPY N/A 11/19/2019   Procedure: CYSTOSCOPY;  Surgeon: [name], MD;  Location: [facility];  Service: Urology;  Laterality: N/A;   HIATAL HERNIA REPAIR N/A 01/23/2022   Procedure: HERNIA REPAIR HIATAL;  Surgeon: Berna Bue, MD;  Location: [facility];  Service: General;  Laterality: N/A;   LAPAROSCOPIC GASTRIC SLEEVE RESECTION N/A 01/23/2022   Procedure: LAPAROSCOPIC SLEEVE GASTRECTOMY;  Surgeon: Berna Bue, MD;  Location: [facility];  Service: General;  Laterality: N/A;   UPPER GI ENDOSCOPY N/A 01/23/2022   Procedure: UPPER GI ENDOSCOPY;  Surgeon: Berna Bue, MD;  Location: [facility];  Service: General;  Laterality: N/A;    FAMILY HISTORY: No family history on file.  SOCIAL HISTORY: Social History   Socioeconomic History   Marital status: Married    Spouse name: Not on file   Number of children: Not on file   Years of education: Not on file   Highest education level: Not on file  Occupational History   Not on file  Tobacco Use   Smoking status: Never   Smokeless tobacco: Never  Vaping Use   Vaping status: Never Used  Substance and Sexual Activity   Alcohol use: Yes    Comment: very  rare   Drug use: Never   Sexual activity: Not on file  Other Topics Concern   Not on  file  Social History Narrative   Not on file   Social Drivers of Health   Financial Resource Strain: Not on file  Food Insecurity: No Food Insecurity (08/02/2022)   Hunger Vital Sign    Worried About Running Out of Food in the Last Year: Never true    Ran Out of Food in the Last Year: Never true  Transportation Needs: No Transportation Needs (08/02/2022)   PRAPARE - Administrator, Civil Service (Medical): No    Lack of Transportation (Non-Medical): No  Physical Activity: Not on file  Stress: Not on file  Social Connections: Not on file  Intimate Partner Violence: Not At Risk (08/02/2022)   Humiliation, Afraid, Rape, and Kick questionnaire    Fear of Current or Ex-Partner: No    Emotionally Abused: No    Physically Abused: No    Sexually Abused: No       PHYSICAL EXAM  Vitals:   07/12/23 0857  BP: 122/83  Pulse: 67  Weight: 137 lb 8 oz (62.4 kg)  Height: 4\' 10"  (1.473 m)    Body mass index is 28.74 kg/m.   General: The patient is well-developed and well-nourished and in no acute distress  HEENT:  Head is [place]/AT.  Sclera are anicteric.  Funduscopic exam shows normal optic discs without edema and normal retinal vessels.    Neck:    The neck and occiput are nontender.  Cardiovascular: The heart has a regular rate and rhythm with a normal S1 and S2. There were no murmurs, gallops or rubs.    Skin: Extremities are without rash or  edema.  Musculoskeletal:  Back is nontender  Neurologic Exam  Mental status: The patient is alert and oriented x 3 at the time of the examination. The patient has apparent normal recent and remote memory, with an apparently normal attention span and concentration ability.   Speech is normal.  Cranial nerves: Extraocular movements are full. Pupils are equal, round, and reactive to light and accomodation.     There is good facial sensation to soft  touch bilaterally.Facial strength is normal.  Trapezius and sternocleidomastoid strength is normal. No dysarthria is noted.  The tongue is midline, and the patient has symmetric elevation of the soft palate. No obvious hearing deficits are noted.  Motor:  Muscle bulk is normal.   Tone is normal. Strength is  5 / 5 in all 4 extremities.   Sensory: Sensory testing is intact to pinprick, soft touch and vibration sensation in all 4 extremities.  Coordination: Cerebellar testing reveals good finger-nose-finger and heel-to-shin bilaterally.  Gait and station: Station is normal.   Gait is normal. Tandem gait is normal. Romberg is negative.   Reflexes: Deep tendon reflexes are symmetric and normal bilaterally.        DIAGNOSTIC DATA (LABS, IMAGING, TESTING) - I reviewed patient records, labs, notes, testing and imaging myself where available.  Lab Results  Component Value Date   WBC 9.0 01/30/2023   HGB 14.7 01/30/2023   HCT 42.5 01/30/2023   MCV 91.8 01/30/2023   PLT 252 01/30/2023      Component Value Date/Time   NA 137 01/30/2023 1222   NA 137 04/21/2013 1027   K 3.1 (L) 01/30/2023 1222   K 3.7 04/21/2013 1027   CL 105 01/30/2023 1222   CL 106 04/21/2013 1027   CO2 25 01/30/2023 1222   CO2 20 (L) 04/21/2013 1027   GLUCOSE 95 01/30/2023 1222   GLUCOSE 90 04/21/2013  1027   BUN 14 01/30/2023 1222   BUN 8 04/21/2013 1027   CREATININE 0.94 01/30/2023 1222   CREATININE 0.86 04/21/2013 1027   CALCIUM 8.2 (L) 01/30/2023 1222   CALCIUM 9.0 04/21/2013 1027   PROT 5.9 (L) 01/30/2023 1222   PROT 8.3 (H) 04/21/2013 1027   ALBUMIN 3.3 (L) 01/30/2023 1222   ALBUMIN 2.9 (L) 04/21/2013 1027   AST 40 01/30/2023 1222   AST 16 04/21/2013 1027   ALT 49 (H) 01/30/2023 1222   ALT 22 04/21/2013 1027   ALKPHOS 80 01/30/2023 1222   ALKPHOS 161 (H) 04/21/2013 1027   BILITOT 1.2 01/30/2023 1222   BILITOT 0.4 04/21/2013 1027   GFRNONAA >60 01/30/2023 1222   GFRNONAA >60 04/21/2013 1027   GFRAA  >60 11/24/2019 0659   GFRAA >60 04/21/2013 1027   No results found for: "CHOL", "HDL", "LDLCALC", "LDLDIRECT", "TRIG", "CHOLHDL" Lab Results  Component Value Date   HGBA1C 4.7 (L) 11/17/2019   No results found for: "VITAMINB12" No results found for: "TSH"     ASSESSMENT AND PLAN  Memory loss  Vascular headache  Varicella zoster meningitis  Anxiety and depression  OSA (obstructive sleep apnea) - Plan: Home sleep test  Empty sella (HCC)     She scored 28/30 on the MoCA (and would be 30/30 with category prompts).  There is no evidence of neurodegenerative process.  Most likely her memory concerns are due to decreased focus, most likely related to her obstructive sleep apnea.  She no longer uses CPAP and we discussed this Amitriptyline for HA and insomnia Set up HST for OSA and consider new machine and different mask (Dreamwear?) based on results. She has an empty sella on her MRI scan.  She does not have evidence of increased intracranial pressure.  A lumbar puncture 08/14/2022 showed normal opening pressure.  There is no papilledema today.   Rtc 6 months   42-minute office visit with the majority of the time spent face-to-face for history and physical, imaging review discussion/counseling and decision-making.  Additional time with record review and documentation.    Lakyla Biswas A. Epimenio Foot, MD, [facility] 07/12/2023, 9:58 AM Certified in Neurology, Clinical Neurophysiology, Sleep Medicine and Neuroimaging  [facility] [address], [address] [place], [place] [address] [phone]

## 2023-08-22 ENCOUNTER — Encounter (HOSPITAL_COMMUNITY): Payer: Self-pay | Admitting: *Deleted

## 2023-08-22 ENCOUNTER — Telehealth: Payer: Self-pay | Admitting: Neurology

## 2023-08-22 NOTE — Telephone Encounter (Signed)
Pt states she received approval from her ins for a HST suggested at her last visit. Would like to know the steps to move forward. Requesting call back

## 2023-08-28 ENCOUNTER — Other Ambulatory Visit: Payer: Self-pay | Admitting: Neurology

## 2023-08-28 ENCOUNTER — Ambulatory Visit (INDEPENDENT_AMBULATORY_CARE_PROVIDER_SITE_OTHER): Payer: BC Managed Care – PPO | Admitting: Neurology

## 2023-08-28 ENCOUNTER — Encounter: Payer: Self-pay | Admitting: Neurology

## 2023-08-28 ENCOUNTER — Telehealth: Payer: Self-pay | Admitting: Neurology

## 2023-08-28 DIAGNOSIS — G43719 Chronic migraine without aura, intractable, without status migrainosus: Secondary | ICD-10-CM | POA: Diagnosis not present

## 2023-08-28 DIAGNOSIS — E236 Other disorders of pituitary gland: Secondary | ICD-10-CM | POA: Diagnosis not present

## 2023-08-28 DIAGNOSIS — R519 Headache, unspecified: Secondary | ICD-10-CM

## 2023-08-28 NOTE — Progress Notes (Signed)
[facility]  PATIENT: Sara Chapman DOB: 09-Dec-1975  REFERRING DOCTOR OR PCP: Fabienne Bruns, PA SOURCE: Patient, note Dr. [place], notes from hospital, imaging and lab reports, MRI images personally reviewed.  _________________________________   HISTORICAL  CHIEF COMPLAINT:  Chief Complaint  Patient presents with   Headache    HISTORY OF PRESENT ILLNESS:  Update 08/28/2023: She reports a severe headache that started 6 or 7 days ago.  It is bilateral and throbbing.  It has been associated with nausea, vomiting, photophobia and phonophobia.  Moving and bright lights worsen the pain.  Usually when she gets headache naratriptan helps but it has not been helpful for her the last week.  The headache has progressively worsened as the week has gone on.  She states that this is her worst headache that she has experienced.  I had previously seen her last year.  Earlier in 2024, she had a viral meningitis, with headaches.  She initially saw Dr. Delena Bali. She had a CT angiogram 09/20/2022.  It did not show any occlusion or significant stenosis.  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  She has scored 28/30 (normal) on the [facility] cognitive assessment at the last visit.   MRI of the brain and orbits 08/19/2022 showed an empty sella.   MRI at that time showed improving preorbital cellulitis.    Due to her headache, she received IV Depakote, Solu-Medrol and Toradol 30 mg.  While she received to the Depakote she felt the headache worsened.  She felt a little flushed but did not have any rash.  Blood pressure increased some to 155/101.  Because headache was not any better after the IV medications, I evaluated her further and did a bilateral splenius capitis/occipital nerve injection.  Unfortunately, that did not help any.  I also gave her a sample tablet of Nurtec ODT 75 mg  I discussed  with her that since nothing we did help the headache that I recommend that she go to the emergency room and have a CT scan of the head and CT venogram.  She lives about an hour away and is reluctant to go to the emergency room here.  I did place an order for the studies to be done if she is able to come to the outpatient imaging center.  She preferred to go back home and to go to the emergency room if her headache does not get any better later today.    From Dr. Delena Bali 09/01/2022: At the end of December she woke up with a severe headache on the right side of her head. Headache continued to worsen so she went to Urgent Care where she was found to have a rash around her eye. She presented to the ED where she was given antibiotics for a presumed bacterial infection in her eye. Her eye continued to swell, so she presented to the ED a second time and was given eye drops. The next day her eye was completely swollen shut and her headache was debilitating. She re-presented to the ED on 07/31/22 and was diagnosed with shingles in her eye. She was noted to be confused and had a fever, so LP was done. 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 MRI brain was unremarkable. MRI orbits showed right periorbital soft tissue thickening and hyperenhancement. She was treated with IV acyclovir. Fever, rash, and confusion improved, however she continued to have headaches and  was treated with Dilaudid, Lyrica, Depakote, Toradol, Fioricet, Imitrex, and verapamil (also for BP control). Imitrex caused palpitations and chest tightness.    OTHER MEDICAL CONDITIONS: HTN, migraines, psoriatic arthritis in infliximab, anxiety, depression  REVIEW OF SYSTEMS: Constitutional: No fevers, chills, sweats, or change in appetite Eyes: No visual changes, double vision, eye pain Ear, nose and throat: No hearing loss, ear pain, nasal congestion, sore throat Cardiovascular: No chest pain, palpitations Respiratory:  No shortness of breath at rest or with exertion.    No wheezes GastrointestinaI: No nausea, vomiting, diarrhea, abdominal pain, fecal incontinence Genitourinary:  No dysuria, urinary retention or frequency.  No nocturia. Musculoskeletal:  No neck pain, back pain Integumentary: No rash, pruritus, skin lesions Neurological: as above Psychiatric: No depression at this time.  No anxiety Endocrine: No palpitations, diaphoresis, change in appetite, change in weigh or increased thirst Hematologic/Lymphatic:  No anemia, purpura, petechiae. Allergic/Immunologic: No itchy/runny eyes, nasal congestion, recent allergic reactions, rashes  ALLERGIES: Allergies  Allergen Reactions   Clonazepam Other (See Comments)    SUICIDAL THOUGHTS     Ibuprofen Other (See Comments)    stomach ulcers    Nsaids Other (See Comments)    GI ulcers   Tolmetin Other (See Comments)    GI ulcers    Compazine [Prochlorperazine]     Delusional unable to tolerate.     Pseudoephedrine Hcl Palpitations and Other (See Comments)        Sumatriptan Palpitations    Felt like she was dying while on med   Tape Rash    Adhesive tape breaks down the skin and causes sores, use paper tape    HOME MEDICATIONS:  Current Outpatient Medications:    amitriptyline (ELAVIL) 25 MG tablet, Take 1 tablet (25 mg total) by mouth at bedtime., Disp: 30 tablet, Rfl: 5   amLODipine (NORVASC) 5 MG tablet, Take by mouth., Disp: , Rfl:    buPROPion (WELLBUTRIN XL) 150 MG 24 hr tablet, Take 150 mg by mouth daily., Disp: , Rfl:    busPIRone (BUSPAR) 10 MG tablet, Take 10 mg by mouth 2 (two) times daily., Disp: , Rfl:    calcium citrate (CALCITRATE - DOSED IN MG ELEMENTAL CALCIUM) 950 (200 Ca) MG tablet, Take 500 mg by mouth 3 (three) times daily., Disp: , Rfl:    chlorpheniramine-HYDROcodone (TUSSIONEX) 10-8 MG/5ML, Take 5 mLs by mouth every 12 (twelve) hours as needed for cough. (Patient not taking: Reported on 07/12/2023), Disp: 70 mL, Rfl: 0   Galcanezumab-gnlm (EMGALITY) 120 MG/ML SOAJ,  INJECT 1 PEN UNDER THE SKIN EVERY 30 DAYS, Disp: 1 mL, Rfl: 1   inFLIXimab (REMICADE IV), Inject into the vein., Disp: , Rfl:    levonorgestrel (MIRENA) 20 MCG/DAY IUD, 1 each by Intrauterine route once., Disp: , Rfl:    Multiple Vitamin (MULTIVITAMIN) tablet, Take 1 tablet by mouth daily., Disp: , Rfl:    naratriptan (AMERGE) 2.5 MG tablet, Take 1 tablet (2.5 mg total) by mouth as needed for migraine. Take one (1) tablet at onset of headache; if returns or does not resolve, may repeat after 4 hours; do not exceed five (5) mg in 24 hours., Disp: 10 tablet, Rfl: 3   ondansetron (ZOFRAN-ODT) 4 MG disintegrating tablet, Take 1 tablet (4 mg total) by mouth every 8 (eight) hours as needed for nausea or vomiting. (Patient not taking: Reported on 07/12/2023), Disp: 20 tablet, Rfl: 0   pantoprazole (PROTONIX) 40 MG tablet, Take 1 tablet (40 mg total) by mouth daily., Disp: 90  tablet, Rfl: 0   pregabalin (LYRICA) 150 MG capsule, Take 1 capsule (150 mg total) by mouth 2 (two) times daily. (Patient not taking: Reported on 07/12/2023), Disp: 60 capsule, Rfl: 0   topiramate (TOPAMAX) 25 MG tablet, Take 1 tablet (25 mg total) by mouth at bedtime. (Patient not taking: Reported on 07/12/2023), Disp: 30 tablet, Rfl: 0   VITAMIN D PO, Take 1 tablet by mouth daily., Disp: , Rfl:   PAST MEDICAL HISTORY: Past Medical History:  Diagnosis Date   Anemia    H/O   Anxiety    Bowel perforation (HCC)    Complication of anesthesia    WOKE UP DURING PLACEMENT OF JP DRAIN AND WAS IN SEVERE PAIN FEELING DRAIN PLACEMENT   GERD (gastroesophageal reflux disease)    Headache    MIGRAINES   History of kidney stones    X1   Hypertension    PONV (postoperative nausea and vomiting)    WITH C-SECTION   Psoriatic arthritis (HCC)    Sleep apnea    CPAP   Stomach ulcer     PAST SURGICAL HISTORY: Past Surgical History:  Procedure Laterality Date   CESAREAN SECTION     X2   COLON SURGERY     CYSTOSCOPY N/A 11/19/2019    Procedure: CYSTOSCOPY;  Surgeon: [name], MD;  Location: [facility];  Service: Urology;  Laterality: N/A;   HIATAL HERNIA REPAIR N/A 01/23/2022   Procedure: HERNIA REPAIR HIATAL;  Surgeon: Berna Bue, MD;  Location: [facility];  Service: General;  Laterality: N/A;   LAPAROSCOPIC GASTRIC SLEEVE RESECTION N/A 01/23/2022   Procedure: LAPAROSCOPIC SLEEVE GASTRECTOMY;  Surgeon: Berna Bue, MD;  Location: [facility];  Service: General;  Laterality: N/A;   UPPER GI ENDOSCOPY N/A 01/23/2022   Procedure: UPPER GI ENDOSCOPY;  Surgeon: Berna Bue, MD;  Location: [facility];  Service: General;  Laterality: N/A;    FAMILY HISTORY: No family history on file.  SOCIAL HISTORY: Social History   Socioeconomic History   Marital status: Married    Spouse name: Not on file   Number of children: Not on file   Years of education: Not on file   Highest education level: Not on file  Occupational History   Not on file  Tobacco Use   Smoking status: Never   Smokeless tobacco: Never  Vaping Use   Vaping status: Never Used  Substance and Sexual Activity   Alcohol use: Yes    Comment: very rare   Drug use: Never   Sexual activity: Not on file  Other Topics Concern   Not on file  Social History Narrative   Not on file   Social Drivers of Health   Financial Resource Strain: Patient Declined (08/08/2023)   Received from [facility]   Overall Financial Resource Strain (CARDIA)    Difficulty of Paying Living Expenses: Patient declined  Food Insecurity: Patient Declined (08/08/2023)   Received from [facility]   Hunger Vital Sign    Worried About Running Out of Food in the Last Year: Patient declined    Ran Out of Food in the Last Year: Patient declined  Transportation Needs: Patient Declined (08/08/2023)   Received from [facility] - Transportation    In the past 12 months, has lack of transportation kept you from medical  appointments or from getting medications?: Patient declined    Lack of Transportation (Non-Medical): Patient declined  Physical Activity: Not  on file  Stress: Not on file  Social Connections: Not on file  Intimate Partner Violence: Not At Risk (08/02/2022)   Humiliation, Afraid, Rape, and Kick questionnaire    Fear of Current or Ex-Partner: No    Emotionally Abused: No    Physically Abused: No    Sexually Abused: No       PHYSICAL EXAM  There were no vitals filed for this visit.   There is no height or weight on file to calculate BMI.   General: The patient is well-developed and well-nourished and in no acute distress  HEENT:  Head is Elmer/AT.  Sclera are anicteric.  Funduscopic exam shows normal optic discs without edema and normal retinal vessels.     Skin: Extremities are without rash or  edema.  Musculoskeletal: She is tender over the occiput bilaterally.  Range of motion is mildly reduced in the neck.  Neurologic Exam  Mental status: The patient is alert and oriented x 3 at the time of the examination. The patient has apparent normal recent and remote memory, with an apparently normal attention span and concentration ability.   Speech is normal.  Cranial nerves: Extraocular movements are full. Pupils are equal, round, and reactive to light and accomodation.     There is good facial sensation to soft touch bilaterally.Facial strength is normal.  Trapezius and sternocleidomastoid strength is normal. No dysarthria is noted.  The tongue is midline, and the patient has symmetric elevation of the soft palate. No obvious hearing deficits are noted.  Motor:  Muscle bulk is normal.   Tone is normal. Strength is  5 / 5 in all 4 extremities.   Gait and station: Station is normal.   Gait is normal.   Reflexes: Deep tendon reflexes are symmetric and normal bilaterally.        DIAGNOSTIC DATA (LABS, IMAGING, TESTING) - I reviewed patient records, labs, notes, testing and imaging  myself where available.  Lab Results  Component Value Date   WBC 9.0 01/30/2023   HGB 14.7 01/30/2023   HCT 42.5 01/30/2023   MCV 91.8 01/30/2023   PLT 252 01/30/2023      Component Value Date/Time   NA 137 01/30/2023 1222   NA 137 04/21/2013 1027   K 3.1 (L) 01/30/2023 1222   K 3.7 04/21/2013 1027   CL 105 01/30/2023 1222   CL 106 04/21/2013 1027   CO2 25 01/30/2023 1222   CO2 20 (L) 04/21/2013 1027   GLUCOSE 95 01/30/2023 1222   GLUCOSE 90 04/21/2013 1027   BUN 14 01/30/2023 1222   BUN 8 04/21/2013 1027   CREATININE 0.94 01/30/2023 1222   CREATININE 0.86 04/21/2013 1027   CALCIUM 8.2 (L) 01/30/2023 1222   CALCIUM 9.0 04/21/2013 1027   PROT 5.9 (L) 01/30/2023 1222   PROT 8.3 (H) 04/21/2013 1027   ALBUMIN 3.3 (L) 01/30/2023 1222   ALBUMIN 2.9 (L) 04/21/2013 1027   AST 40 01/30/2023 1222   AST 16 04/21/2013 1027   ALT 49 (H) 01/30/2023 1222   ALT 22 04/21/2013 1027   ALKPHOS 80 01/30/2023 1222   ALKPHOS 161 (H) 04/21/2013 1027   BILITOT 1.2 01/30/2023 1222   BILITOT 0.4 04/21/2013 1027   GFRNONAA >60 01/30/2023 1222   GFRNONAA >60 04/21/2013 1027   GFRAA >60 11/24/2019 0659   GFRAA >60 04/21/2013 1027   No results found for: "CHOL", "HDL", "LDLCALC", "LDLDIRECT", "TRIG", "CHOLHDL" Lab Results  Component Value Date   HGBA1C 4.7 (L) 11/17/2019  No results found for: "VITAMINB12" No results found for: "TSH"     ASSESSMENT AND PLAN  Intractable headache, unspecified chronicity pattern, unspecified headache type  Empty sella (HCC)  Intractable chronic migraine without aura and without status migrainosus   She likely has a severe intractable migraine headache.  IV Depacon, Solu-Medrol and Toradol.   Bilateral splenius capitis trigger point injections with 4 cc of 0.5% Marcaine.  She tolerated the injections well but headache did not improve any after this procedure.   Because headache is persisting I recommend that she get a CT scan of the head and a CT  venogram.  She did not want to go to the emergency room in [place] as she lives about an hour away.  If she is not better she will go to her local emergency room when she gets back home.   She has an empty sella on her MRI scan.   There is no papilledema today.  A lumbar puncture 08/14/2022 showed normal opening pressure. .   Rtc 6 months     Shomari Matusik A. Epimenio Foot, MD, [facility] 08/28/2023, 5:11 PM Certified in Neurology, Clinical Neurophysiology, Sleep Medicine and Neuroimaging  [facility] [address] [place], [place] [address] [phone]

## 2023-08-28 NOTE — Telephone Encounter (Signed)
Pt said, went [facility] urgent care yesterday; got an injection of Toradol. Gave no relief. Today migraine is so bad my eye balls hurt. Have been taking Galcanezumab-gnlm (EMGALITY) 120 MG/ML SOAJ and naratriptan (AMERGE) 2.5 MG tablet  as prescribed. Would like a call back on what to do.

## 2023-08-28 NOTE — Telephone Encounter (Signed)
Called pt. MD approved for her to come in for migraine infusion. Provided below orders to intrafusion. She will come today at 1:30pm and will bring her insurance cards.

## 2023-08-28 NOTE — Telephone Encounter (Signed)
CT head BC[employer] auth: [id number] exp. 08/28/23-09/26/23  CT venogram BCBS auth: [id number] exp. 08/28/23-09/26/23 sent to GI [phone]

## 2023-08-29 ENCOUNTER — Emergency Department: Payer: BC Managed Care – PPO | Admitting: Neurology

## 2023-08-29 ENCOUNTER — Other Ambulatory Visit: Payer: Self-pay

## 2023-08-29 ENCOUNTER — Other Ambulatory Visit: Payer: Self-pay | Admitting: Neurology

## 2023-08-29 ENCOUNTER — Ambulatory Visit
Admission: RE | Admit: 2023-08-29 | Discharge: 2023-08-29 | Disposition: A | Discharge status: Home / Self Care | Payer: BC Managed Care – PPO | Source: Ambulatory Visit | Attending: Neurology | Admitting: Neurology

## 2023-08-29 ENCOUNTER — Emergency Department: Payer: BC Managed Care – PPO

## 2023-08-29 ENCOUNTER — Emergency Department
Admission: EM | Admit: 2023-08-29 | Discharge: 2023-08-29 | Discharge status: Against Medical Advice | Payer: BC Managed Care – PPO | Attending: Emergency Medicine | Admitting: Emergency Medicine

## 2023-08-29 DIAGNOSIS — R519 Headache, unspecified: Secondary | ICD-10-CM

## 2023-08-29 DIAGNOSIS — G43909 Migraine, unspecified, not intractable, without status migrainosus: Secondary | ICD-10-CM | POA: Insufficient documentation

## 2023-08-29 DIAGNOSIS — R200 Anesthesia of skin: Secondary | ICD-10-CM | POA: Diagnosis not present

## 2023-08-29 DIAGNOSIS — G4733 Obstructive sleep apnea (adult) (pediatric): Secondary | ICD-10-CM

## 2023-08-29 DIAGNOSIS — Z5321 Procedure and treatment not carried out due to patient leaving prior to being seen by health care provider: Secondary | ICD-10-CM | POA: Insufficient documentation

## 2023-08-29 DIAGNOSIS — G932 Benign intracranial hypertension: Secondary | ICD-10-CM

## 2023-08-29 DIAGNOSIS — R42 Dizziness and giddiness: Secondary | ICD-10-CM | POA: Diagnosis present

## 2023-08-29 DIAGNOSIS — G8929 Other chronic pain: Secondary | ICD-10-CM

## 2023-08-29 LAB — COMPREHENSIVE METABOLIC PANEL
ALT: 31 U/L (ref 0–44)
AST: 33 U/L (ref 15–41)
Albumin: 4.4 g/dL (ref 3.5–5.0)
Alkaline Phosphatase: 72 U/L (ref 38–126)
Anion gap: 15 (ref 5–15)
BUN: 13 mg/dL (ref 6–20)
CO2: 16 mmol/L — ABNORMAL LOW (ref 22–32)
Calcium: 9.3 mg/dL (ref 8.9–10.3)
Chloride: 106 mmol/L (ref 98–111)
Creatinine, Ser: 1.14 mg/dL — ABNORMAL HIGH (ref 0.44–1.00)
GFR, Estimated: 59 mL/min — ABNORMAL LOW (ref >60)
Glucose, Bld: 198 mg/dL — ABNORMAL HIGH (ref 70–99)
Potassium: 3.9 mmol/L (ref 3.5–5.1)
Sodium: 137 mmol/L (ref 135–145)
Total Bilirubin: 0.8 mg/dL (ref 0.0–1.2)
Total Protein: 8.7 g/dL — ABNORMAL HIGH (ref 6.5–8.1)

## 2023-08-29 LAB — ETHANOL: Alcohol, Ethyl (B): <10 mg/dL (ref <10)

## 2023-08-29 LAB — APTT: aPTT: 23 s — ABNORMAL LOW (ref 24–36)

## 2023-08-29 LAB — PROTIME-INR
INR: 1 (ref 0.8–1.2)
Prothrombin Time: 13.2 s (ref 11.4–15.2)

## 2023-08-29 MED ORDER — IOPAMIDOL (ISOVUE-370) INJECTION 76%
75.0000 mL | Freq: Once | INTRAVENOUS | Status: AC | PRN
  Administered 2023-08-29: 75 mL via INTRAVENOUS

## 2023-08-29 NOTE — ED Triage Notes (Signed)
Pt to ED via POV c/o migraine and left arm numbness/heaviness. Pt reports started getting migraines last Wednesday. Got a nerve block on back of neck yesterday. Pt pain hasn't gotten any better. Pt developed dizzines, n/v, blurry vision in both eyes and left arm numbness/heaviness. Hx of meningitis and shingles in right eye.

## 2023-09-03 ENCOUNTER — Other Ambulatory Visit: Payer: Self-pay | Admitting: Neurology

## 2023-09-03 ENCOUNTER — Ambulatory Visit
Admission: RE | Admit: 2023-09-03 | Discharge: 2023-09-03 | Disposition: A | Discharge status: Home / Self Care | Payer: BC Managed Care – PPO | Source: Ambulatory Visit | Attending: Neurology | Admitting: Neurology

## 2023-09-03 DIAGNOSIS — R519 Headache, unspecified: Secondary | ICD-10-CM

## 2023-09-03 DIAGNOSIS — G932 Benign intracranial hypertension: Secondary | ICD-10-CM

## 2023-09-03 DIAGNOSIS — G8929 Other chronic pain: Secondary | ICD-10-CM

## 2023-09-03 MED ORDER — AJOVY 225 MG/1.5ML ~~LOC~~ SOAJ: SUBCUTANEOUS | 11 refills | Status: DC

## 2023-09-03 NOTE — Discharge Instructions (Signed)
 Lumbar Puncture Discharge Instructions  Go home and rest quietly as needed. You may resume normal activities; however, do not exert yourself strongly or do any heavy lifting today and tomorrow.   DO NOT drive today.    You may resume your normal diet and medications unless otherwise indicated. Drink lots of extra fluids today and tomorrow.   The incidence of headache, nausea, or vomiting is about 5% (one in 20 patients).  If you develop a headache, lie flat for 24 hours and drink plenty of fluids until the headache goes away.  Caffeinated beverages may be helpful. If when you get up you still have a headache when standing, go back to bed and force fluids for another 24 hours.   If you develop severe nausea and vomiting or a headache that does not go away with the flat bedrest after 48 hours, please call [phone].   Call your physician for a follow-up appointment.  The results of your Lumbar Puncture will be sent directly to your physician and they will contact you.   If you have any questions or if complications develop after you arrive home, please call [phone].  Discharge instructions have been explained to the patient.  The patient, or the person responsible for the patient, fully understands these instructions.   Thank you for visiting our office today.

## 2023-09-03 NOTE — Progress Notes (Addendum)
   [facility]  HOME SLEEP STUDY  STUDY DATE: 09/02/2023 PATIENT NAME: Sara Chapman DOB: 04/02/1976 MRN: [id number]  ORDERING CLINICIAN: Richard A. Epimenio Foot, MD, PhD INTERPRETING CLINICIAN: Richard A. Sater, MD. PhD   CLINICAL INFORMATION: 48 year old woman with history of obstructive sleep apnea   IMPRESSION:  Normal sleep efficiency Mild obstructive sleep apnea with a pAHI 3% of 12.3/h.  There was moderately severe REM associated OSA with a REM pAHI 3% of 29.9/h No nocturnal hypoxemia.   RECOMMENDATION: Recommend AutoPap 5 to 15 cm H2O pressure with heated humidifier and download in 30 to 90 days. Follow-up with Dr. Epimenio Foot   INTERPRETING PHYSICIAN:   Pearletha Furl. Epimenio Foot, MD, PhD, [facility] Certified in Neurology, Clinical Neurophysiology, Sleep Medicine, Pain Medicine and Neuroimaging  [facility] [address] [place], [place] [address] [phone]

## 2023-09-04 ENCOUNTER — Telehealth: Payer: Self-pay

## 2023-09-04 ENCOUNTER — Other Ambulatory Visit: Payer: Self-pay | Admitting: *Deleted

## 2023-09-04 DIAGNOSIS — G8929 Other chronic pain: Secondary | ICD-10-CM

## 2023-09-04 MED ORDER — AJOVY 225 MG/1.5ML ~~LOC~~ SOAJ: 1.5000 mL | SUBCUTANEOUS | 0 refills | Status: DC

## 2023-09-04 NOTE — Telephone Encounter (Signed)
Sample Ajovy set aside in fridge. Messaged pt via mychart.

## 2023-09-04 NOTE — Telephone Encounter (Signed)
Pt came and picked up Ajovy Sample 12:48pm on 09/04/2023.

## 2023-09-06 ENCOUNTER — Other Ambulatory Visit (HOSPITAL_COMMUNITY): Payer: Self-pay

## 2023-09-06 ENCOUNTER — Telehealth: Payer: Self-pay

## 2023-09-06 NOTE — Telephone Encounter (Signed)
*  [facility]  Pharmacy Patient Advocate Encounter   Received notification from Fax that prior authorization for AJOVY  (fremanezumab -vfrm) injection 225MG /1.5ML auto-injectors  is required/requested.   Insurance verification completed.   The patient is insured through [id number] .   Per test claim: PA required; PA submitted to above mentioned insurance via CoverMyMeds Key/confirmation #/EOC [facility] Status is pending

## 2023-09-06 NOTE — Telephone Encounter (Signed)
 Pharmacy Patient Advocate Encounter  Received notification from [facility] that Prior Authorization for AJOVY  (fremanezumab -vfrm) injection 225MG /1.5ML auto-injectors  has been APPROVED from 09/06/2023 to 12/04/2023. Ran test claim, Copay is $24.98. This test claim was processed through [facility]- copay amounts may vary at other pharmacies due to pharmacy/plan contracts, or as the patient moves through the different stages of their insurance plan.   [employer], the manufacturer of AJOVY  225MG /1.5 ML AUTOINJECTO paid 50.02 toward your plan copay. Benefit could change after  1  more fill(s).

## 2023-09-07 ENCOUNTER — Encounter: Payer: Self-pay | Admitting: Neurology

## 2023-09-10 ENCOUNTER — Other Ambulatory Visit: Payer: Self-pay | Admitting: Neurology

## 2023-09-13 ENCOUNTER — Telehealth: Payer: Self-pay | Admitting: *Deleted

## 2023-09-13 ENCOUNTER — Other Ambulatory Visit: Payer: Self-pay | Admitting: Neurology

## 2023-09-13 DIAGNOSIS — G4733 Obstructive sleep apnea (adult) (pediatric): Secondary | ICD-10-CM

## 2023-09-13 NOTE — Telephone Encounter (Signed)
Meagan Freida Busman reports pt reached out inquiring about sleep study results that she completed on 09/02/23. No results routed from MD to clinic team yet. I messaged Dr. Epimenio Foot and he approved for [place] to contact pt about results below.   I called pt at [phone]. Relayed results per Dr. Bonnita Hollow note. She is agreeable to using [facility] as DME. Aware order will be sent today. They should call her in the next week. If not, she will call them at [phone]. Aware they will need to get approval through insurance first and then will call to schedule her to go in to get machine.  Scheduled initial CPAP f/u for 12/03/23 at 2:30pm with Dr. Epimenio Foot. Relayed she needs visit 31-90 days with our office after starting on machine for insurance purposes. Cx 01/23/24 appt since she is coming 12/03/23.   Sent community message to [facility] that order placed.

## 2023-09-26 ENCOUNTER — Encounter: Payer: Self-pay | Admitting: Neurology

## 2023-10-01 ENCOUNTER — Other Ambulatory Visit: Payer: Self-pay

## 2023-10-01 MED ORDER — NARATRIPTAN HCL 2.5 MG PO TABS: 2.5000 mg | ORAL_TABLET | ORAL | 3 refills | Status: DC | PRN

## 2023-10-02 LAB — CSF CELL COUNT WITH DIFFERENTIAL
RBC Count, CSF: 0 {cells}/uL
TOTAL NUCLEATED CELL: 0 {cells}/uL (ref 0–5)

## 2023-10-02 LAB — CSF CULTURE W GRAM STAIN
MICRO NUMBER:: 16032431
Result:: NO GROWTH
SPECIMEN QUALITY:: ADEQUATE

## 2023-10-02 LAB — FUNGUS CULTURE W SMEAR
CULTURE:: NO GROWTH
MICRO NUMBER:: 16032430
SMEAR:: NONE SEEN
SPECIMEN QUALITY:: ADEQUATE

## 2023-10-02 LAB — PROTEIN, CSF: Total Protein, CSF: 30 mg/dL (ref 15–45)

## 2023-10-02 LAB — VDRL, CSF: VDRL Quant, CSF: NONREACTIVE

## 2023-10-02 LAB — GLUCOSE, CSF: Glucose, CSF: 53 mg/dL (ref 40–80)

## 2023-11-09 ENCOUNTER — Other Ambulatory Visit (HOSPITAL_COMMUNITY): Payer: Self-pay

## 2023-11-09 ENCOUNTER — Telehealth: Payer: Self-pay

## 2023-11-09 NOTE — Telephone Encounter (Signed)
 I received a request to renew PA via Fax-PT ha snot been evaluated since starting Ajoy and Insurance requires documentation of the questions below (Questions were answered yes just to allow all questions to populate)-Please advise, Thanks

## 2023-11-28 NOTE — Telephone Encounter (Signed)
 LVM for pt to call office

## 2023-11-30 NOTE — Progress Notes (Unsigned)
 Sara Chapman

## 2023-12-03 ENCOUNTER — Encounter: Payer: Self-pay | Admitting: Neurology

## 2023-12-03 ENCOUNTER — Ambulatory Visit (INDEPENDENT_AMBULATORY_CARE_PROVIDER_SITE_OTHER): Payer: BC Managed Care – PPO | Admitting: Neurology

## 2023-12-03 VITALS — BP 160/90 | HR 84 | Ht <= 58 in | Wt 135.5 lb

## 2023-12-03 DIAGNOSIS — R413 Other amnesia: Secondary | ICD-10-CM | POA: Diagnosis not present

## 2023-12-03 DIAGNOSIS — R519 Headache, unspecified: Secondary | ICD-10-CM | POA: Diagnosis not present

## 2023-12-03 DIAGNOSIS — G4733 Obstructive sleep apnea (adult) (pediatric): Secondary | ICD-10-CM

## 2023-12-03 DIAGNOSIS — B021 Zoster meningitis: Secondary | ICD-10-CM

## 2023-12-03 DIAGNOSIS — E236 Other disorders of pituitary gland: Secondary | ICD-10-CM | POA: Diagnosis not present

## 2023-12-03 DIAGNOSIS — F32A Depression, unspecified: Secondary | ICD-10-CM

## 2023-12-03 DIAGNOSIS — F419 Anxiety disorder, unspecified: Secondary | ICD-10-CM

## 2023-12-03 DIAGNOSIS — G8929 Other chronic pain: Secondary | ICD-10-CM

## 2023-12-03 NOTE — Progress Notes (Signed)
 [facility]  PATIENT: Sara Chapman DOB: Jan 16, 1976  REFERRING DOCTOR OR PCP: Finis Hugger, PA SOURCE: Patient, note Dr. [place], notes from hospital, imaging and lab reports, MRI images personally reviewed.  _________________________________   HISTORICAL  CHIEF COMPLAINT:  Chief Complaint  Patient presents with   Follow-up    Pt in 10 alone Pt here for cpap f/u Pt states not able to sleep with mask Pt states mask is uncomfortable. Pt states currently have nasal pillows . Pt states does work a little bit     HISTORY OF PRESENT ILLNESS:  Update 12/03/2023: She has OSA and started CPAP . Compliance is poor (only 9%) though efficacy is excellent (AHI=1.6).   HST study 08/29/2023 showed Mild obstructive sleep apnea with a pAHI 3% of 12.3/h. There was moderately severe REM associated OSA with a REM pAHI 3% of 29.9/h.   She has trouble tolerating the CPAP and rarely uses it.   When she used it she was slightly less sleepy.  She tried 3-4 different masks.   She settled on the pillows as the Dreamwear was less comfortable.    She had severe OSA when she weighted 50-60 pounds more a few years back and tried CPAP.  She was more compliant then but stopped after the weight loss.     She has not had any more severe headache but is still having mild ones.  She is on Ajovy  and prn naratriptan .  She stopped topiramate .  She takes amitriptyline  (for insomnia more than migraine).    She is trying to eat better, fresher food..   She had a severe HA in January 2025.  Due to her headache, she received IV Depakote, Solu-Medrol and Toradol  30 mg.  While she received to the Depakote she felt the headache worsened.  She felt a little flushed but did not have any rash.  Blood pressure increased some to 155/101.  Because headache was not any better after the IV medications, I evaluated her further and did a bilateral splenius capitis/occipital nerve injection.  Unfortunately, that did not help  any.  I also gave her a sample tablet of Nurtec ODT 75 mg  Since nt better, I had her go to the ED for urgent imaging.  CT head and venogram were fine.  CSF pressure and labs were fine.    She had a CT angiogram 09/20/2022.  It did not show any occlusion or significant stenosis.  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  She has scored 28/30 (normal) on the [facility] cognitive assessment in 2024.  She feels stable    MRI of the brain and orbits 08/19/2022 showed an empty sella.   MRI at that time showed improving preorbital cellulitis.     From Dr. Billy Bue 09/01/2022: At the end of December 2023 she woke up with a severe headache on the right side of her head. Headache continued to worsen so she went to Urgent Care where she was found to have a rash around her eye. She presented to the ED where she was given antibiotics for a presumed bacterial infection in her eye. Her eye continued to swell, so she presented to the ED a second time and was given eye drops. The next day her eye was completely swollen shut and her headache was debilitating. She re-presented to the ED on 07/31/22 and was diagnosed with shingles in her eye.  She was noted to be confused and had a fever, so LP was done. 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 MRI brain was unremarkable. MRI orbits showed right periorbital soft tissue thickening and hyperenhancement. She was treated with IV acyclovir . Fever, rash, and confusion improved, however she continued to have headaches and was treated with Dilaudid , Lyrica , Depakote, Toradol , Fioricet , Imitrex , and verapamil  (also for BP control). Imitrex  caused palpitations and chest tightness.    OTHER MEDICAL CONDITIONS: HTN, migraines, psoriatic arthritis in infliximab, anxiety, depression  REVIEW OF SYSTEMS: Constitutional: No fevers, chills, sweats, or change in appetite Eyes: No visual changes, double vision, eye pain Ear,  nose and throat: No hearing loss, ear pain, nasal congestion, sore throat Cardiovascular: No chest pain, palpitations Respiratory:  No shortness of breath at rest or with exertion.   No wheezes GastrointestinaI: No nausea, vomiting, diarrhea, abdominal pain, fecal incontinence Genitourinary:  No dysuria, urinary retention or frequency.  No nocturia. Musculoskeletal:  No neck pain, back pain Integumentary: No rash, pruritus, skin lesions Neurological: as above Psychiatric: No depression at this time.  No anxiety Endocrine: No palpitations, diaphoresis, change in appetite, change in weigh or increased thirst Hematologic/Lymphatic:  No anemia, purpura, petechiae. Allergic/Immunologic: No itchy/runny eyes, nasal congestion, recent allergic reactions, rashes  ALLERGIES: Allergies  Allergen Reactions   Clonazepam Other (See Comments)    SUICIDAL THOUGHTS     Ibuprofen Other (See Comments)    stomach ulcers    Nsaids Other (See Comments)    GI ulcers   Tolmetin Other (See Comments)    GI ulcers    Compazine  [Prochlorperazine ]     Delusional unable to tolerate.     Pseudoephedrine Hcl Palpitations and Other (See Comments)        Sumatriptan  Palpitations    Felt like she was dying while on med   Tape Rash    Adhesive tape breaks down the skin and causes sores, use paper tape    HOME MEDICATIONS:  Current Outpatient Medications:    amitriptyline  (ELAVIL ) 25 MG tablet, Take 1 tablet (25 mg total) by mouth at bedtime., Disp: 30 tablet, Rfl: 5   amLODipine  (NORVASC ) 5 MG tablet, Take by mouth., Disp: , Rfl:    calcium citrate (CALCITRATE - DOSED IN MG ELEMENTAL CALCIUM) 950 (200 Ca) MG tablet, Take 500 mg by mouth 3 (three) times daily., Disp: , Rfl:    Fremanezumab -vfrm (AJOVY ) 225 MG/1.5ML SOAJ, 1.5 mL injected subcu every 4 weeks, Disp: 1.68 mL, Rfl: 11   inFLIXimab (REMICADE IV), Inject into the vein., Disp: , Rfl:    Multiple Vitamin (MULTIVITAMIN) tablet, Take 1 tablet by  mouth daily., Disp: , Rfl:    naratriptan  (AMERGE) 2.5 MG tablet, Take 1 tablet (2.5 mg total) by mouth as needed for migraine. Take one (1) tablet at onset of headache; if returns or does not resolve, may repeat after 4 hours; do not exceed five (5) mg in 24 hours., Disp: 10 tablet, Rfl: 3   pantoprazole  (PROTONIX ) 40 MG tablet, Take 1 tablet (40 mg total) by mouth daily. (Patient taking differently: Take 40 mg by mouth as needed.), Disp: 90 tablet, Rfl: 0   VITAMIN D PO, Take 1 tablet by mouth daily., Disp: , Rfl:    busPIRone  (BUSPAR ) 10 MG tablet, Take 10 mg by mouth 2 (two) times daily., Disp: , Rfl:   PAST MEDICAL HISTORY: Past Medical History:  Diagnosis Date   Anemia    H/O   Anxiety    Bowel perforation (HCC)  Complication of anesthesia    WOKE UP DURING PLACEMENT OF JP DRAIN AND WAS IN SEVERE PAIN FEELING DRAIN PLACEMENT   GERD (gastroesophageal reflux disease)    Headache    MIGRAINES   History of kidney stones    X1   Hypertension    PONV (postoperative nausea and vomiting)    WITH C-SECTION   Psoriatic arthritis (HCC)    Sleep apnea    CPAP   Stomach ulcer     PAST SURGICAL HISTORY: Past Surgical History:  Procedure Laterality Date   CESAREAN SECTION     X2   COLON SURGERY     CYSTOSCOPY N/A 11/19/2019   Procedure: CYSTOSCOPY;  Surgeon: Dustin Gimenez, MD;  Location: [facility];  Service: Urology;  Laterality: N/A;   HIATAL HERNIA REPAIR N/A 01/23/2022   Procedure: HERNIA REPAIR HIATAL;  Surgeon: Adalberto Acton, MD;  Location: [facility];  Service: General;  Laterality: N/A;   LAPAROSCOPIC GASTRIC SLEEVE RESECTION N/A 01/23/2022   Procedure: LAPAROSCOPIC SLEEVE GASTRECTOMY;  Surgeon: Adalberto Acton, MD;  Location: [facility];  Service: General;  Laterality: N/A;   UPPER GI ENDOSCOPY N/A 01/23/2022   Procedure: UPPER GI ENDOSCOPY;  Surgeon: Adalberto Acton, MD;  Location: [facility];  Service: General;  Laterality: N/A;    FAMILY HISTORY: Family History  Problem  Relation Age of Onset   Sleep apnea Father    Sleep apnea Sister     SOCIAL HISTORY: Social History   Socioeconomic History   Marital status: Married    Spouse name: Not on file   Number of children: Not on file   Years of education: Not on file   Highest education level: Not on file  Occupational History   Not on file  Tobacco Use   Smoking status: Never   Smokeless tobacco: Never  Vaping Use   Vaping status: Never Used  Substance and Sexual Activity   Alcohol use: Yes    Comment: very rare   Drug use: Never   Sexual activity: Not on file  Other Topics Concern   Not on file  Social History Narrative   Pt lives with husband    Pt works    Social Drivers of Corporate investment banker Strain: Patient Declined (08/08/2023)   Received from [facility]   Overall Financial Resource Strain (CARDIA)    Difficulty of Paying Living Expenses: Patient declined  Food Insecurity: No Food Insecurity (08/29/2023)   Received from [facility]   Hunger Vital Sign    Worried About Running Out of Food in the Last Year: Never true    Ran Out of Food in the Last Year: Never true  Transportation Needs: No Transportation Needs (08/29/2023)   Received from [facility] - Transportation    In the past 12 months, has lack of transportation kept you from medical appointments or from getting medications?: No    Lack of Transportation (Non-Medical): No  Physical Activity: Not on file  Stress: Not on file  Social Connections: Not on file  Intimate Partner Violence: Not At Risk (08/02/2022)   Humiliation, Afraid, Rape, and Kick questionnaire    Fear of Current or Ex-Partner: No    Emotionally Abused: No    Physically Abused: No    Sexually Abused: No       PHYSICAL EXAM  Vitals:   12/03/23 1419  BP: (!) 160/90  Pulse: 84  Weight: 135 lb 8  oz (61.5 kg)  Height: 4\' 9"  (1.448 m)     Body mass index is 29.32  kg/m.   General: The patient is well-developed and well-nourished and in no acute distress  HEENT:  Head is [place]/AT.  Sclera are anicteric.  Funduscopic exam shows normal optic discs without edema and normal retinal vessels.     Skin: Extremities are without rash or  edema.  Musculoskeletal: She is tender over the occiput bilaterally.  Range of motion is mildly reduced in the neck.  Neurologic Exam  Mental status: The patient is alert and oriented x 3 at the time of the examination. The patient has apparent normal recent and remote memory, with an apparently normal attention span and concentration ability.   Speech is normal.  Cranial nerves: Extraocular movements are full. Pupils are equal, round, and reactive to light and accomodation.     There is good facial sensation to soft touch bilaterally.Facial strength is normal.  Trapezius and sternocleidomastoid strength is normal. No dysarthria is noted.  The tongue is midline, and the patient has symmetric elevation of the soft palate. No obvious hearing deficits are noted.  Motor:  Muscle bulk is normal.   Tone is normal. Strength is  5 / 5 in all 4 extremities.   Gait and station: Station is normal.   Gait is normal.   Reflexes: Deep tendon reflexes are symmetric and normal bilaterally.        DIAGNOSTIC DATA (LABS, IMAGING, TESTING) - I reviewed patient records, labs, notes, testing and imaging myself where available.  Lab Results  Component Value Date   WBC 9.0 01/30/2023   HGB 14.7 01/30/2023   HCT 42.5 01/30/2023   MCV 91.8 01/30/2023   PLT 252 01/30/2023      Component Value Date/Time   NA 137 08/29/2023 0425   NA 137 04/21/2013 1027   K 3.9 08/29/2023 0425   K 3.7 04/21/2013 1027   CL 106 08/29/2023 0425   CL 106 04/21/2013 1027   CO2 16 (L) 08/29/2023 0425   CO2 20 (L) 04/21/2013 1027   GLUCOSE 198 (H) 08/29/2023 0425   GLUCOSE 90 04/21/2013 1027   BUN 13 08/29/2023 0425   BUN 8 04/21/2013 1027   CREATININE  1.14 (H) 08/29/2023 0425   CREATININE 0.86 04/21/2013 1027   CALCIUM 9.3 08/29/2023 0425   CALCIUM 9.0 04/21/2013 1027   PROT 8.7 (H) 08/29/2023 0425   PROT 8.3 (H) 04/21/2013 1027   ALBUMIN  4.4 08/29/2023 0425   ALBUMIN  2.9 (L) 04/21/2013 1027   AST 33 08/29/2023 0425   AST 16 04/21/2013 1027   ALT 31 08/29/2023 0425   ALT 22 04/21/2013 1027   ALKPHOS 72 08/29/2023 0425   ALKPHOS 161 (H) 04/21/2013 1027   BILITOT 0.8 08/29/2023 0425   BILITOT 0.4 04/21/2013 1027   GFRNONAA 59 (L) 08/29/2023 0425   GFRNONAA >60 04/21/2013 1027   GFRAA >60 11/24/2019 0659   GFRAA >60 04/21/2013 1027   No results found for: "CHOL", "HDL", "LDLCALC", "LDLDIRECT", "TRIG", "CHOLHDL" Lab Results  Component Value Date   HGBA1C 4.7 (L) 11/17/2019   No results found for: "VITAMINB12" No results found for: "TSH"     ASSESSMENT AND PLAN  OSA (obstructive sleep apnea)  Chronic nonintractable headache, unspecified headache type  Empty sella (HCC)  Memory loss  Anxiety and depression  Varicella zoster meningitis   HA's are better.  Continue Ajovy  and prn naratriptan    CPAP.  We dicussed some strategies to try  to improve compliance.   She has an empty sella on her MRI scan.     A lumbar puncture 09/2023 showed normal opening pressure. .   Rtc 12 months     Ravina Milner A. Godwin Lat, MD, [facility] 12/03/2023, 2:51 PM Certified in Neurology, Clinical Neurophysiology, Sleep Medicine and Neuroimaging  [facility] [address], [address] [place], [place] [address] [phone]

## 2024-01-05 ENCOUNTER — Other Ambulatory Visit: Payer: Self-pay | Admitting: Neurology

## 2024-01-08 NOTE — Telephone Encounter (Signed)
 Last seen on 12/23/23  Follow up scheduled on 12/08/24

## 2024-01-23 ENCOUNTER — Ambulatory Visit: Payer: BC Managed Care – PPO | Admitting: Neurology

## 2024-02-13 ENCOUNTER — Other Ambulatory Visit: Payer: Self-pay

## 2024-02-13 ENCOUNTER — Emergency Department

## 2024-02-13 ENCOUNTER — Encounter: Payer: Self-pay | Admitting: Emergency Medicine

## 2024-02-13 ENCOUNTER — Emergency Department
Admission: EM | Admit: 2024-02-13 | Discharge: 2024-02-13 | Disposition: A | Discharge status: Home / Self Care | Attending: Emergency Medicine | Admitting: Emergency Medicine

## 2024-02-13 DIAGNOSIS — S060X9A Concussion with loss of consciousness of unspecified duration, initial encounter: Secondary | ICD-10-CM | POA: Diagnosis not present

## 2024-02-13 DIAGNOSIS — S60222A Contusion of left hand, initial encounter: Secondary | ICD-10-CM | POA: Diagnosis not present

## 2024-02-13 DIAGNOSIS — S5011XA Contusion of right forearm, initial encounter: Secondary | ICD-10-CM | POA: Insufficient documentation

## 2024-02-13 DIAGNOSIS — R1084 Generalized abdominal pain: Secondary | ICD-10-CM | POA: Diagnosis not present

## 2024-02-13 DIAGNOSIS — R0602 Shortness of breath: Secondary | ICD-10-CM | POA: Insufficient documentation

## 2024-02-13 DIAGNOSIS — Y92410 Unspecified street and highway as the place of occurrence of the external cause: Secondary | ICD-10-CM | POA: Diagnosis not present

## 2024-02-13 DIAGNOSIS — S8012XA Contusion of left lower leg, initial encounter: Secondary | ICD-10-CM | POA: Diagnosis not present

## 2024-02-13 DIAGNOSIS — S0990XA Unspecified injury of head, initial encounter: Secondary | ICD-10-CM | POA: Diagnosis present

## 2024-02-13 LAB — CBC
HCT: 49.1 % — ABNORMAL HIGH (ref 36.0–46.0)
Hemoglobin: 16.7 g/dL — ABNORMAL HIGH (ref 12.0–15.0)
MCH: 30.5 pg (ref 26.0–34.0)
MCHC: 34 g/dL (ref 30.0–36.0)
MCV: 89.6 fL (ref 80.0–100.0)
Platelets: 347 K/uL (ref 150–400)
RBC: 5.48 MIL/uL — ABNORMAL HIGH (ref 3.87–5.11)
RDW: 12.4 % (ref 11.5–15.5)
WBC: 10.3 K/uL (ref 4.0–10.5)
nRBC: 0 % (ref 0.0–0.2)

## 2024-02-13 LAB — COMPREHENSIVE METABOLIC PANEL WITH GFR
ALT: 14 U/L (ref 0–44)
AST: 21 U/L (ref 15–41)
Albumin: 3.5 g/dL (ref 3.5–5.0)
Alkaline Phosphatase: 51 U/L (ref 38–126)
Anion gap: 8 (ref 5–15)
BUN: 10 mg/dL (ref 6–20)
CO2: 21 mmol/L — ABNORMAL LOW (ref 22–32)
Calcium: 8.3 mg/dL — ABNORMAL LOW (ref 8.9–10.3)
Chloride: 108 mmol/L (ref 98–111)
Creatinine, Ser: 0.98 mg/dL (ref 0.44–1.00)
GFR, Estimated: >60 mL/min (ref >60)
Glucose, Bld: 118 mg/dL — ABNORMAL HIGH (ref 70–99)
Potassium: 3.3 mmol/L — ABNORMAL LOW (ref 3.5–5.1)
Sodium: 137 mmol/L (ref 135–145)
Total Bilirubin: 0.8 mg/dL (ref 0.0–1.2)
Total Protein: 6.5 g/dL (ref 6.5–8.1)

## 2024-02-13 LAB — URINALYSIS, ROUTINE W REFLEX MICROSCOPIC
Bilirubin Urine: NEGATIVE
Glucose, UA: NEGATIVE mg/dL
Hgb urine dipstick: NEGATIVE
Ketones, ur: 5 mg/dL — AB
Leukocytes,Ua: NEGATIVE
Nitrite: POSITIVE — AB
Protein, ur: NEGATIVE mg/dL
Specific Gravity, Urine: >1.046 — ABNORMAL HIGH (ref 1.005–1.030)
pH: 6 (ref 5.0–8.0)

## 2024-02-13 LAB — HCG, QUANTITATIVE, PREGNANCY: hCG, Beta Chain, Quant, S: <1 m[IU]/mL (ref <5)

## 2024-02-13 MED ORDER — MORPHINE SULFATE (PF) 4 MG/ML IV SOLN
4.0000 mg | Freq: Once | INTRAVENOUS | Status: AC
  Administered 2024-02-13: 4 mg via INTRAVENOUS
  Filled 2024-02-13: qty 1

## 2024-02-13 MED ORDER — SODIUM CHLORIDE 0.9 % IV BOLUS
1000.0000 mL | Freq: Once | INTRAVENOUS | Status: AC
  Administered 2024-02-13: 1000 mL via INTRAVENOUS

## 2024-02-13 MED ORDER — ONDANSETRON HCL 4 MG/2ML IJ SOLN
4.0000 mg | Freq: Once | INTRAMUSCULAR | Status: AC
  Administered 2024-02-13: 4 mg via INTRAVENOUS
  Filled 2024-02-13: qty 2

## 2024-02-13 MED ORDER — HYDROMORPHONE HCL 1 MG/ML IJ SOLN
1.0000 mg | Freq: Once | INTRAMUSCULAR | Status: AC
  Administered 2024-02-13: 1 mg via INTRAVENOUS
  Filled 2024-02-13: qty 1

## 2024-02-13 MED ORDER — IOHEXOL 300 MG/ML  SOLN
100.0000 mL | Freq: Once | INTRAMUSCULAR | Status: AC | PRN
  Administered 2024-02-13: 100 mL via INTRAVENOUS

## 2024-02-13 NOTE — ED Notes (Signed)
 MD Viviann instructs this RN to repeat pt's vital signs.

## 2024-02-13 NOTE — ED Notes (Signed)
 PT's family to the nurses stations with concerns of pt stating that she feels as if she is going to pass out. Md Staffor notified.
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
## 2024-02-13 NOTE — ED Notes (Signed)
 Pt's family to nurses station requesting something to drink. Pt given a cup of sprite per pt/family request.
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
## 2024-02-13 NOTE — ED Notes (Signed)
 Pt needing to go to the bathroom, this RN asked MD Viviann to speak with pt regarding pt's scan result, so that pt will be comfortable with movement to use bathroom. MD Viviann to bedside.

## 2024-02-13 NOTE — ED Notes (Signed)
 PT's family voices concerns of pt not being discharged with prescription pain medication. This Rn explained that MD recommends that pt take over the counter pain medications for pain.

## 2024-02-13 NOTE — ED Notes (Addendum)
 Pt able to lift hips with no assistance, and placed on bedpan.

## 2024-02-13 NOTE — ED Notes (Signed)
 Patient transported to CT

## 2024-02-13 NOTE — ED Notes (Addendum)
 Pt family to desk with concerns for patient. Pt taken to triage room for repeat vitals. PT vomiting in triage. PT to room 40.

## 2024-02-13 NOTE — ED Notes (Signed)
 Patient given a cup of ice per MD Viviann
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
## 2024-02-13 NOTE — ED Notes (Signed)
 MD Viviann notified of pt's complaint of pain, and requested additional pain medication.

## 2024-02-13 NOTE — ED Notes (Signed)
 Pt removed from bedpan.

## 2024-02-14 ENCOUNTER — Telehealth: Payer: Self-pay | Admitting: Neurology

## 2024-02-14 DIAGNOSIS — R55 Syncope and collapse: Secondary | ICD-10-CM
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
## 2024-02-14 NOTE — Telephone Encounter (Signed)
 Dr. Vear- I reviewed ED note and do not see mention of following up with Neurology. Told to f/u with PCP. Do you want her to set up appt with you to further evaluate/discuss?

## 2024-02-18 NOTE — Telephone Encounter (Signed)
 Called pt and scheduled EEG for 7/22 @ 12:45 pm.

## 2024-02-18 NOTE — Telephone Encounter (Signed)
 Jillian/Angel- I scheduled for her OV to see Dr. Vear 02/20/24. Can you please get her scheduled for EEG? Thank you

## 2024-02-19 ENCOUNTER — Ambulatory Visit (INDEPENDENT_AMBULATORY_CARE_PROVIDER_SITE_OTHER): Admitting: Neurology

## 2024-02-19 DIAGNOSIS — R55 Syncope and collapse: Secondary | ICD-10-CM

## 2024-02-19 NOTE — Progress Notes (Signed)
   [facility]  EEG (ELECTROENCEPHALOGRAM) REPORT   STUDY DATE: 02/19/2024 PATIENT NAME: Sara Chapman DOB: 03/03/1976 MRN: [id number]  ORDERING CLINICIAN: Kerriann Kamphuis A. Vear, MD. PhD  TECHNOLOGIST: Burnard Plummer, REEGT TECHNIQUE: Electroencephalogram was recorded utilizing standard 10-20 system of lead placement and reformatted into average and bipolar montages.  RECORDING TIME: 26 minutes 17 seconds  CLINICAL INFORMATION: 48 year old woman with possible seizure  FINDINGS: A digital EEG was performed while the patient was awake and drowsy. While awake and most alert there was a 12 hz posterior dominant rhythm. Voltages and frequencies were symmetric.  There were no focal, lateralizing, epileptiform activity or seizures seen.  Photic stimulation had a normal driving response. Hyperventilation was unremarkable with slowing followed by recovery. EKG channel shows normal sinus rhythm.  The second half of the recording she is drowsy.  She did not enter definitive sleep.  IMPRESSION: This is a normal EEG while the patient was awake and asleep   INTERPRETING PHYSICIAN:   Franchon Ketterman A. Vear, MD, PhD, [facility] Certified in Neurology, Clinical Neurophysiology, Sleep Medicine, Pain Medicine and Neuroimaging  [facility] [address], [address] [place], [place] [address] [phone]

## 2024-02-20 ENCOUNTER — Ambulatory Visit (INDEPENDENT_AMBULATORY_CARE_PROVIDER_SITE_OTHER): Admitting: Neurology

## 2024-02-20 ENCOUNTER — Encounter: Payer: Self-pay | Admitting: Neurology

## 2024-02-20 VITALS — BP 133/94 | HR 76 | Ht <= 58 in | Wt 135.0 lb

## 2024-02-20 DIAGNOSIS — G8929 Other chronic pain: Secondary | ICD-10-CM

## 2024-02-20 DIAGNOSIS — F419 Anxiety disorder, unspecified: Secondary | ICD-10-CM

## 2024-02-20 DIAGNOSIS — R569 Unspecified convulsions: Secondary | ICD-10-CM | POA: Diagnosis not present

## 2024-02-20 DIAGNOSIS — R519 Headache, unspecified: Secondary | ICD-10-CM | POA: Diagnosis not present

## 2024-02-20 DIAGNOSIS — E236 Other disorders of pituitary gland: Secondary | ICD-10-CM | POA: Diagnosis not present

## 2024-02-20 DIAGNOSIS — G4733 Obstructive sleep apnea (adult) (pediatric): Secondary | ICD-10-CM

## 2024-02-20 DIAGNOSIS — F32A Depression, unspecified: Secondary | ICD-10-CM

## 2024-02-20 MED ORDER — LAMOTRIGINE 100 MG PO TABS: 100.0000 mg | ORAL_TABLET | Freq: Two times a day (BID) | ORAL | 11 refills | Status: AC
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
## 2024-04-02 ENCOUNTER — Other Ambulatory Visit: Payer: Self-pay | Admitting: Family Medicine

## 2024-04-02 DIAGNOSIS — R109 Unspecified abdominal pain: Secondary | ICD-10-CM

## 2024-04-03 ENCOUNTER — Ambulatory Visit
Admission: RE | Admit: 2024-04-03 | Discharge: 2024-04-03 | Disposition: A | Discharge status: Home / Self Care | Source: Ambulatory Visit | Attending: Family Medicine | Admitting: Family Medicine

## 2024-04-03 ENCOUNTER — Other Ambulatory Visit: Payer: Self-pay | Admitting: Family Medicine

## 2024-04-03 DIAGNOSIS — R109 Unspecified abdominal pain: Secondary | ICD-10-CM

## 2024-07-04 ENCOUNTER — Other Ambulatory Visit: Payer: Self-pay | Admitting: Neurology

## 2024-08-07 ENCOUNTER — Other Ambulatory Visit: Payer: Self-pay

## 2024-08-07 ENCOUNTER — Other Ambulatory Visit: Payer: Self-pay | Admitting: Neurology

## 2024-08-20 ENCOUNTER — Encounter (HOSPITAL_COMMUNITY): Payer: Self-pay | Admitting: *Deleted

## 2024-08-26 ENCOUNTER — Telehealth: Payer: Self-pay | Admitting: Neurology

## 2024-08-26 NOTE — Telephone Encounter (Signed)
 Patient said Wednesday 08/20/24 started seeing spots, felt faint and went a laid down because thought would pass out. Seen PCP and shared the symptoms with her: Blood Pressure spikes,sweaty,  Headaches, nausea, feel faint. She did a blood test and advised to see neurologist due to ACTH is low.

## 2024-08-27 NOTE — Telephone Encounter (Signed)
 I called and LMVM for pt that did save appt for tomorrow at 1330 for f/u headaches. She is to call back and confirm either way.
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
## 2024-08-27 NOTE — Telephone Encounter (Signed)
 Noted.

## 2024-08-27 NOTE — Telephone Encounter (Signed)
 Pt called back  to confirm , she can  make it in office tomorrow at 1:30 with Dr.Sater

## 2024-08-28 ENCOUNTER — Ambulatory Visit: Admitting: Neurology

## 2024-08-28 ENCOUNTER — Encounter: Payer: Self-pay | Admitting: Neurology

## 2024-08-28 VITALS — BP 122/88 | HR 85 | Ht <= 58 in | Wt 139.5 lb

## 2024-08-28 DIAGNOSIS — R413 Other amnesia: Secondary | ICD-10-CM

## 2024-08-28 DIAGNOSIS — G43709 Chronic migraine without aura, not intractable, without status migrainosus: Secondary | ICD-10-CM

## 2024-08-28 DIAGNOSIS — R42 Dizziness and giddiness: Secondary | ICD-10-CM | POA: Diagnosis not present

## 2024-08-28 DIAGNOSIS — G44221 Chronic tension-type headache, intractable: Secondary | ICD-10-CM

## 2024-08-28 DIAGNOSIS — E236 Other disorders of pituitary gland: Secondary | ICD-10-CM

## 2024-08-28 DIAGNOSIS — R55 Syncope and collapse: Secondary | ICD-10-CM | POA: Diagnosis not present

## 2024-08-28 MED ORDER — BETAMETHASONE SOD PHOS & ACET 6 (3-3) MG/ML IJ SUSP: 12.0000 mg | Freq: Once | INTRAMUSCULAR | Status: AC

## 2024-08-28 MED ORDER — NURTEC 75 MG PO TBDP: ORAL_TABLET | ORAL | 11 refills | Status: AC
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
## 2024-08-28 NOTE — Addendum Note (Signed)
 Addended by: VEAR CHARLIE LABOR on: 08/28/2024 04:11 PM   Modules accepted: Orders

## 2024-12-08 ENCOUNTER — Ambulatory Visit: Admitting: Adult Health
